# Patient Record
Sex: Female | Born: 1980 | Race: Black or African American | Hispanic: No | Marital: Married | Smoking: Never smoker
Health system: Southern US, Community
[De-identification: ages and names within clinical notes are randomized; demographics above are authoritative.]

## PROBLEM LIST (undated history)

## (undated) DIAGNOSIS — J309 Allergic rhinitis, unspecified: Secondary | ICD-10-CM

## (undated) DIAGNOSIS — Z9889 Other specified postprocedural states: Secondary | ICD-10-CM

## (undated) DIAGNOSIS — E669 Obesity, unspecified: Secondary | ICD-10-CM

## (undated) DIAGNOSIS — R519 Headache, unspecified: Secondary | ICD-10-CM

## (undated) DIAGNOSIS — H4710 Unspecified papilledema: Secondary | ICD-10-CM

## (undated) DIAGNOSIS — I10 Essential (primary) hypertension: Secondary | ICD-10-CM

## (undated) DIAGNOSIS — D649 Anemia, unspecified: Secondary | ICD-10-CM

## (undated) HISTORY — DX: Obesity, unspecified: E66.9

## (undated) HISTORY — DX: Unspecified papilledema: H47.10

## (undated) HISTORY — DX: Other specified postprocedural states: Z98.890

## (undated) HISTORY — DX: Allergic rhinitis, unspecified: J30.9

---
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
## 2003-03-28 ENCOUNTER — Emergency Department (HOSPITAL_COMMUNITY): Admission: EM | Admit: 2003-03-28 | Discharge: 2003-03-29 | Payer: Self-pay | Admitting: Emergency Medicine

## 2003-03-29 ENCOUNTER — Encounter: Payer: Self-pay | Admitting: Emergency Medicine

## 2003-09-12 ENCOUNTER — Ambulatory Visit (HOSPITAL_BASED_OUTPATIENT_CLINIC_OR_DEPARTMENT_OTHER): Admission: RE | Admit: 2003-09-12 | Discharge: 2003-09-12 | Payer: Self-pay | Admitting: Specialist

## 2003-09-12 ENCOUNTER — Encounter (INDEPENDENT_AMBULATORY_CARE_PROVIDER_SITE_OTHER): Payer: Self-pay | Admitting: *Deleted

## 2003-09-12 ENCOUNTER — Ambulatory Visit (HOSPITAL_COMMUNITY): Admission: RE | Admit: 2003-09-12 | Discharge: 2003-09-12 | Payer: Self-pay | Admitting: Specialist

## 2003-10-08 HISTORY — PX: BREAST REDUCTION SURGERY: SHX8

## 2003-11-07 ENCOUNTER — Emergency Department (HOSPITAL_COMMUNITY): Admission: AD | Admit: 2003-11-07 | Discharge: 2003-11-07 | Payer: Self-pay | Admitting: Family Medicine

## 2005-08-26 ENCOUNTER — Emergency Department (HOSPITAL_COMMUNITY): Admission: EM | Admit: 2005-08-26 | Discharge: 2005-08-26 | Payer: Self-pay | Admitting: Emergency Medicine

## 2005-10-08 ENCOUNTER — Encounter: Payer: Self-pay | Admitting: Internal Medicine

## 2005-10-08 LAB — CONVERTED CEMR LAB

## 2005-12-24 ENCOUNTER — Emergency Department (HOSPITAL_COMMUNITY): Admission: EM | Admit: 2005-12-24 | Discharge: 2005-12-24 | Payer: Self-pay | Admitting: Family Medicine

## 2007-02-05 ENCOUNTER — Ambulatory Visit: Payer: Self-pay | Admitting: Internal Medicine

## 2007-02-05 LAB — CONVERTED CEMR LAB
ALT: 18 units/L (ref 0–40)
AST: 17 units/L (ref 0–37)
Albumin: 3.6 g/dL (ref 3.5–5.2)
Alkaline Phosphatase: 53 units/L (ref 39–117)
BUN: 9 mg/dL (ref 6–23)
Basophils Absolute: 0 10*3/uL (ref 0.0–0.1)
Basophils Relative: 0.2 % (ref 0.0–1.0)
Bilirubin, Direct: 0.1 mg/dL (ref 0.0–0.3)
CO2: 29 meq/L (ref 19–32)
Calcium: 9.1 mg/dL (ref 8.4–10.5)
Chloride: 106 meq/L (ref 96–112)
Creatinine, Ser: 0.9 mg/dL (ref 0.4–1.2)
Eosinophils Absolute: 0 10*3/uL (ref 0.0–0.6)
Eosinophils Relative: 0.4 % (ref 0.0–5.0)
GFR calc Af Amer: 100 mL/min
GFR calc non Af Amer: 82 mL/min
Glucose, Bld: 95 mg/dL (ref 70–99)
HCT: 37 % (ref 36.0–46.0)
Hemoglobin: 12.8 g/dL (ref 12.0–15.0)
Lymphocytes Relative: 26.1 % (ref 12.0–46.0)
MCHC: 34.6 g/dL (ref 30.0–36.0)
MCV: 95 fL (ref 78.0–100.0)
Monocytes Absolute: 0.6 10*3/uL (ref 0.2–0.7)
Monocytes Relative: 7.9 % (ref 3.0–11.0)
Neutro Abs: 4.7 10*3/uL (ref 1.4–7.7)
Neutrophils Relative %: 65.4 % (ref 43.0–77.0)
Platelets: 314 10*3/uL (ref 150–400)
Potassium: 4.1 meq/L (ref 3.5–5.1)
RBC: 3.89 M/uL (ref 3.87–5.11)
RDW: 12.4 % (ref 11.5–14.6)
Sodium: 139 meq/L (ref 135–145)
TSH: 0.58 microintl units/mL (ref 0.35–5.50)
Total Bilirubin: 0.6 mg/dL (ref 0.3–1.2)
Total Protein: 7 g/dL (ref 6.0–8.3)
WBC: 7.2 10*3/uL (ref 4.5–10.5)

## 2007-02-23 ENCOUNTER — Encounter: Admission: RE | Admit: 2007-02-23 | Discharge: 2007-02-23 | Payer: Self-pay | Admitting: Internal Medicine

## 2007-05-16 ENCOUNTER — Encounter: Payer: Self-pay | Admitting: Internal Medicine

## 2007-05-16 DIAGNOSIS — E669 Obesity, unspecified: Secondary | ICD-10-CM | POA: Insufficient documentation

## 2007-05-16 HISTORY — DX: Obesity, unspecified: E66.9

## 2008-03-31 ENCOUNTER — Emergency Department (HOSPITAL_COMMUNITY): Admission: EM | Admit: 2008-03-31 | Discharge: 2008-03-31 | Payer: Self-pay | Admitting: Emergency Medicine

## 2009-04-11 ENCOUNTER — Inpatient Hospital Stay (HOSPITAL_COMMUNITY): Admission: AD | Admit: 2009-04-11 | Discharge: 2009-04-12 | Payer: Self-pay | Admitting: Obstetrics & Gynecology

## 2009-04-14 ENCOUNTER — Inpatient Hospital Stay (HOSPITAL_COMMUNITY): Admission: AD | Admit: 2009-04-14 | Discharge: 2009-04-14 | Payer: Self-pay | Admitting: Obstetrics & Gynecology

## 2009-07-11 ENCOUNTER — Ambulatory Visit: Payer: Self-pay | Admitting: Internal Medicine

## 2009-07-11 DIAGNOSIS — J069 Acute upper respiratory infection, unspecified: Secondary | ICD-10-CM | POA: Insufficient documentation

## 2009-07-11 DIAGNOSIS — J309 Allergic rhinitis, unspecified: Secondary | ICD-10-CM

## 2009-07-11 HISTORY — DX: Allergic rhinitis, unspecified: J30.9

## 2011-01-13 LAB — COMPREHENSIVE METABOLIC PANEL
ALT: 30 U/L (ref 0–35)
AST: 20 U/L (ref 0–37)
Albumin: 2.8 g/dL — ABNORMAL LOW (ref 3.5–5.2)
Alkaline Phosphatase: 59 U/L (ref 39–117)
BUN: 2 mg/dL — ABNORMAL LOW (ref 6–23)
CO2: 21 mEq/L (ref 19–32)
Calcium: 8.5 mg/dL (ref 8.4–10.5)
Chloride: 102 mEq/L (ref 96–112)
Creatinine, Ser: 0.68 mg/dL (ref 0.4–1.2)
GFR calc Af Amer: >60 mL/min (ref >60)
GFR calc non Af Amer: >60 mL/min (ref >60)
Glucose, Bld: 83 mg/dL (ref 70–99)
Potassium: 3.3 mEq/L — ABNORMAL LOW (ref 3.5–5.1)
Sodium: 133 mEq/L — ABNORMAL LOW (ref 135–145)
Total Bilirubin: 0.4 mg/dL (ref 0.3–1.2)
Total Protein: 6.7 g/dL (ref 6.0–8.3)

## 2011-01-13 LAB — CBC
HCT: 27.6 % — ABNORMAL LOW (ref 36.0–46.0)
Hemoglobin: 9.7 g/dL — ABNORMAL LOW (ref 12.0–15.0)
MCHC: 35.1 g/dL (ref 30.0–36.0)
MCV: 95.2 fL (ref 78.0–100.0)
Platelets: 227 10*3/uL (ref 150–400)
RBC: 2.9 MIL/uL — ABNORMAL LOW (ref 3.87–5.11)
RDW: 13.3 % (ref 11.5–15.5)
WBC: 13.1 10*3/uL — ABNORMAL HIGH (ref 4.0–10.5)

## 2011-01-13 LAB — DIFFERENTIAL
Basophils Absolute: 0.1 10*3/uL (ref 0.0–0.1)
Basophils Relative: 1 % (ref 0–1)
Eosinophils Absolute: 0.1 10*3/uL (ref 0.0–0.7)
Eosinophils Relative: 1 % (ref 0–5)
Lymphocytes Relative: 11 % — ABNORMAL LOW (ref 12–46)
Lymphs Abs: 1.4 10*3/uL (ref 0.7–4.0)
Monocytes Absolute: 0.8 10*3/uL (ref 0.1–1.0)
Monocytes Relative: 6 % (ref 3–12)
Neutro Abs: 10.7 10*3/uL — ABNORMAL HIGH (ref 1.7–7.7)
Neutrophils Relative %: 82 % — ABNORMAL HIGH (ref 43–77)

## 2011-01-13 LAB — WET PREP, GENITAL
Clue Cells Wet Prep HPF POC: NONE SEEN
Trich, Wet Prep: NONE SEEN
Yeast Wet Prep HPF POC: NONE SEEN

## 2011-01-13 LAB — URINALYSIS, ROUTINE W REFLEX MICROSCOPIC
Bilirubin Urine: NEGATIVE
Glucose, UA: 100 mg/dL — AB
Ketones, ur: 40 mg/dL — AB
Nitrite: NEGATIVE
Protein, ur: 30 mg/dL — AB
Specific Gravity, Urine: 1.02 (ref 1.005–1.030)
Urobilinogen, UA: 4 mg/dL — ABNORMAL HIGH (ref 0.0–1.0)
pH: 7 (ref 5.0–8.0)

## 2011-01-13 LAB — URINE MICROSCOPIC-ADD ON

## 2011-01-13 LAB — HCG, QUANTITATIVE, PREGNANCY
hCG, Beta Chain, Quant, S: 1022 m[IU]/mL — ABNORMAL HIGH (ref <5)
hCG, Beta Chain, Quant, S: 26759 m[IU]/mL — ABNORMAL HIGH (ref <5)

## 2011-01-13 LAB — GC/CHLAMYDIA PROBE AMP, GENITAL
Chlamydia, DNA Probe: NEGATIVE
GC Probe Amp, Genital: NEGATIVE

## 2011-01-13 LAB — URINE CULTURE
Colony Count: NO GROWTH
Culture: NO GROWTH

## 2011-01-13 LAB — POCT PREGNANCY, URINE: Preg Test, Ur: POSITIVE

## 2011-01-13 LAB — ABO/RH: ABO/RH(D): B POS

## 2011-02-22 NOTE — Op Note (Signed)
NAME:  Angel Clayton, Angel Clayton                         ACCOUNT NO.:  [id number]   MEDICAL RECORD NO.:  [id number]                   PATIENT TYPE:  AMB   LOCATION:  DSC                                  FACILITY:  [facility]   PHYSICIAN:  Earvin Hansen L. Shon Hough, M.D.           DATE OF BIRTH:  03/31/1981   DATE OF PROCEDURE:  09/12/2003  DATE OF DISCHARGE:                                 OPERATIVE REPORT   INDICATIONS FOR PROCEDURE:  A 30 year old with severe macromastia, back and  shoulder pain, secondary to large pendulous breast, increased intertrigo,  and has problems posturally as well as neck and back pain.   PROCEDURE:  Bilateral breast reductions using the inferior pedicle  technique.   ANESTHESIA:  General.   SURGEON:  Gerald L. Shon Hough, M.D.   ASSISTANT:  Alethia Berthold, C.S.A.-P.A.O.   DESCRIPTION OF PROCEDURE:  The patient was taken to the operating room and  placed on the operating room table in the supine position, after being drawn  for the inferior pedicle reduction mammoplasty preoperatively sitting up.  She was redrawn to have the nipple/areolar complex be at 21 cm from the  suprasternal notch.  She then underwent general anesthesia and was intubated  orally and a prep was done to the chest and breasts area in a routine  fashion using Betadine soap and solution, and walled off with sterile towels  and drapes, so as to make a sterile field.  An examination was done of the  breasts showing severe macromastia.  The patient also demonstrates an  accessory breast nipple on the left side within the areola.  After sterile  towels and drapes had been placed, the wounds were injected with Xylocaine  0.25% with epinephrine in a 1:400,000 concentration, a total of 150 mL per  side.  The wounds were scored with a #15 blade, and the skin of the inferior  pedicle was degrees-epithelialized with a #20 blade.  The medial and lateral  fatty dermal pedicles were excised down to underlying fascia.   Hemostasis  was maintained with the Bovie on electrocoagulation.  Out laterally more  tissue was removed, including some accessory breast tissue.  The new key  hold area was also debulked, and then the flaps were transposed and stayed  with #3-0 Prolene.  A subcutaneous closure was done with #3-0 Monocryl x2  levels, and then a running subcuticular stitch of #3-0 Monocryl and #5-0  Monocryl throughout the inverted T.  The wounds were drained with a #10  Blake drain which was placed in the depths of the wound and brought out  through the lateral-most portion of the secretion and secured with #3-0  Prolene.  The wounds were cleansed.  Steri-Strips and soft dressing were  applied to all the areas.  She withstood the procedures very well and was taken to the recovery room in  excellent condition.  The estimated blood loss was 150 mL.  Complications:  None.                                               Yaakov Guthrie. Shon Hough, M.D.    Cathie Hoops  D:  09/12/2003  T:  09/12/2003  Job:  [id number]

## 2011-02-22 NOTE — Assessment & Plan Note (Signed)
[facility]                           PRIMARY CARE OFFICE NOTE   NAME:Clayton, Angel MACLELLAN                      MRN:          [id number]  DATE:02/05/2007                            DOB:          [birth year]    The patient is a 30 year old female, a new patient who presents to  establish primary with me.  She complains of headaches several times a  week, they may last from 15 minutes to an hour and occasionally may  relate to her period.  Occasionally they can be accompanied by nausea.  On occasion prior to headache she would see jagged lines in front of the  eyes.   PAST MEDICAL HISTORY:  Unremarkable.   PAST SURGICAL HISTORY:  Breast reduction in 2005.   ALLERGIES:  NONE.   MEDICINES:  Tylenol p.r.n.   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   FAMILY HISTORY:  Negative for migraines.  Her mom is Michiel Cowboy.   REVIEW OF SYSTEMS:  [facility], no  chest pain or shortness of breath, no syncope, gradual weight gain.  The  rest of the 18 point review of systems is negative.   PHYSICAL:  Blood pressure 132/85, pulse 88, temperature 98.1, weight 221  pounds.  She is in no acute distress, overweight.  HEENT:  With moist mucosa.  NECK:  Supple, no bruits.  She has somewhat enlarged thyroid gland  without nodules.  HEART:  S1, S2, no murmur or gallop.  ABDOMEN:  Soft, nontender, no organomegaly or mass.  LOWER EXTREMITY:  Without edema.  She is awake, alert and cooperative.  Denies being depressed.  Cranial  nerves II-XII nonfocal.  Deep tendon reflexes within normal limits.   ASSESSMENT/PLAN:  1. Migraine headaches, given Midrin to take 1 every hour up to maximum      of 5 p.r.n. migraine.  She can try an Imitrex 100 mg daily p.r.n.      for headaches, she will take ibuprofen 600 mg and 4 Excedrin p.r.n.  2. Goiter, obtain thyroid ultrasound.  Obtained  lab work including      TSH.  3. Obesity discussed, she will try to do her best.  4. Borderline elevated blood pressure, low salt diet, weight loss,      exercise.  5. General health maintenance, advised start Gardasil injection,      information provided.     Georgina Quint. Plotnikov, MD     AVP/MedQ  DD: 02/11/2007  DT: 02/11/2007  Job #: [id number]

## 2011-06-07 ENCOUNTER — Encounter: Payer: Self-pay | Admitting: Internal Medicine

## 2011-06-07 ENCOUNTER — Ambulatory Visit (INDEPENDENT_AMBULATORY_CARE_PROVIDER_SITE_OTHER): Payer: BC Managed Care – HMO | Admitting: Internal Medicine

## 2011-06-07 VITALS — BP 108/70 | HR 107 | Temp 99.3°F | Ht 66.0 in | Wt 234.2 lb

## 2011-06-07 DIAGNOSIS — L0501 Pilonidal cyst with abscess: Secondary | ICD-10-CM

## 2011-06-07 MED ORDER — TRAMADOL HCL 50 MG PO TABS: 50.0000 mg | ORAL_TABLET | Freq: Four times a day (QID) | ORAL | Status: AC | PRN

## 2011-06-07 MED ORDER — DOXYCYCLINE HYCLATE 100 MG PO TABS: 100.0000 mg | ORAL_TABLET | Freq: Two times a day (BID) | ORAL | Status: AC

## 2011-06-07 NOTE — Progress Notes (Signed)
  Subjective:    Patient ID: Angel Clayton, female    DOB: 1981-02-28, 28 y.o.   MRN: [id number]  HPI   Here with 2-3 days acute onset fever,  pain, pressure, general weakness and malaise, and swelling with mild to mod tenderness to natal cleft area, no prior hx, no chills or drainage, with slight ST, but little to no cough and Pt denies chest pain, increased sob or doe, wheezing, orthopnea, PND, increased LE swelling, palpitations, dizziness or syncope.  Pt denies new neurological symptoms such as new headache, or facial or extremity weakness or numbness   Pt denies polydipsia, polyuria. Past Medical History  Diagnosis Date  . ALLERGIC RHINITIS 07/11/2009  . OBESITY 05/16/2007  . Hx of breast reduction, elective    No past surgical history on file.  reports that she has never smoked. She does not have any smokeless tobacco history on file. Her alcohol and drug histories not on file. family history is not on file. No Known Allergies No current outpatient prescriptions on file prior to visit.    Review of Systems All otherwise neg per pt      Objective:   Physical Exam BP 108/70  Pulse 107  Temp(Src) 99.3 F (37.4 C) (Oral)  Ht 5\' 6"  (1.676 m)  Wt 234 lb 4 oz (106.255 kg)  BMI 37.81 kg/m2  SpO2 96%  LMP 05/31/2011 Physical Exam  VS noted, ovese Constitutional: Pt appears well-developed and well-nourished.  HENT: Head: Normocephalic.  Right Ear: External ear normal.  Left Ear: External ear normal.  Eyes: Conjunctivae and EOM are normal. Pupils are equal, round, and reactive to light.  Neck: Normal range of motion. Neck supple.  Cardiovascular: Normal rate and regular rhythm.   Pulmonary/Chest: Effort normal and breath sounds normal.  Neurological: Pt is alert. No cranial nerve deficit.  Skin: Skin is warm. No erythema. Natal cleft with 1cm area mod tender, red, swollen, ? Fluctuant, nondraining Psychiatric: Pt behavior is normal. Thought content normal.         Assessment  & Plan:

## 2011-06-07 NOTE — Assessment & Plan Note (Signed)
Mild to mod, for antibx course, will need further eval and tx  - likely I&D or even pilonidocystectomy; - refer gen surgury;  I prefer today if possible but pt declines  - will accept sept 4 though

## 2011-06-07 NOTE — Patient Instructions (Signed)
Take all new medications as prescribed Continue all other medications as before You will be contacted regarding the referral for: General Surgury - please see the PCC's for the referral appt today You are given the work note today

## 2011-06-11 ENCOUNTER — Encounter (INDEPENDENT_AMBULATORY_CARE_PROVIDER_SITE_OTHER): Payer: Self-pay | Admitting: Surgery

## 2011-06-11 ENCOUNTER — Encounter (INDEPENDENT_AMBULATORY_CARE_PROVIDER_SITE_OTHER): Payer: BC Managed Care – HMO | Admitting: Surgery

## 2011-06-12 NOTE — Progress Notes (Signed)
This encounter was created in error - please disregard.  This encounter was created in error - please disregard.

## 2011-06-17 ENCOUNTER — Encounter (INDEPENDENT_AMBULATORY_CARE_PROVIDER_SITE_OTHER): Payer: BC Managed Care – HMO | Admitting: Surgery

## 2011-06-18 ENCOUNTER — Ambulatory Visit (INDEPENDENT_AMBULATORY_CARE_PROVIDER_SITE_OTHER): Payer: Self-pay | Admitting: *Deleted

## 2011-06-18 DIAGNOSIS — Z111 Encounter for screening for respiratory tuberculosis: Secondary | ICD-10-CM

## 2011-06-20 LAB — TB SKIN TEST
Induration: 0
TB Skin Test: NEGATIVE mm

## 2012-11-10 ENCOUNTER — Encounter: Payer: Self-pay | Admitting: Certified Nurse Midwife

## 2012-11-11 ENCOUNTER — Encounter: Payer: Self-pay | Admitting: Certified Nurse Midwife

## 2012-12-29 ENCOUNTER — Encounter: Payer: Self-pay | Admitting: Certified Nurse Midwife

## 2013-01-05 ENCOUNTER — Encounter: Payer: Self-pay | Admitting: Certified Nurse Midwife

## 2013-01-05 NOTE — Progress Notes (Deleted)
Noted.  Was letter sent

## 2013-01-06 NOTE — Progress Notes (Signed)
Can not close out of in basket

## 2013-04-05 ENCOUNTER — Emergency Department (HOSPITAL_COMMUNITY)
Admission: EM | Admit: 2013-04-05 | Discharge: 2013-04-05 | Disposition: A | Discharge status: Home / Self Care | Payer: Self-pay | Attending: Emergency Medicine | Admitting: Emergency Medicine

## 2013-04-05 ENCOUNTER — Encounter (HOSPITAL_COMMUNITY): Payer: Self-pay | Admitting: *Deleted

## 2013-04-05 DIAGNOSIS — X58XXXA Exposure to other specified factors, initial encounter: Secondary | ICD-10-CM | POA: Insufficient documentation

## 2013-04-05 DIAGNOSIS — E669 Obesity, unspecified: Secondary | ICD-10-CM | POA: Insufficient documentation

## 2013-04-05 DIAGNOSIS — Z79899 Other long term (current) drug therapy: Secondary | ICD-10-CM | POA: Insufficient documentation

## 2013-04-05 DIAGNOSIS — S058X9A Other injuries of unspecified eye and orbit, initial encounter: Secondary | ICD-10-CM | POA: Insufficient documentation

## 2013-04-05 DIAGNOSIS — Y9389 Activity, other specified: Secondary | ICD-10-CM | POA: Insufficient documentation

## 2013-04-05 DIAGNOSIS — R110 Nausea: Secondary | ICD-10-CM | POA: Insufficient documentation

## 2013-04-05 DIAGNOSIS — R51 Headache: Secondary | ICD-10-CM | POA: Insufficient documentation

## 2013-04-05 DIAGNOSIS — Z8709 Personal history of other diseases of the respiratory system: Secondary | ICD-10-CM | POA: Insufficient documentation

## 2013-04-05 DIAGNOSIS — S0501XA Injury of conjunctiva and corneal abrasion without foreign body, right eye, initial encounter: Secondary | ICD-10-CM

## 2013-04-05 DIAGNOSIS — Y929 Unspecified place or not applicable: Secondary | ICD-10-CM | POA: Insufficient documentation

## 2013-04-05 DIAGNOSIS — Z872 Personal history of diseases of the skin and subcutaneous tissue: Secondary | ICD-10-CM | POA: Insufficient documentation

## 2013-04-05 MED ORDER — FLUORESCEIN SODIUM 1 MG OP STRP
1.0000 | ORAL_STRIP | Freq: Once | OPHTHALMIC | Status: AC
  Administered 2013-04-05: 16:00:00 via OPHTHALMIC
  Filled 2013-04-05: qty 1

## 2013-04-05 MED ORDER — HYDROCODONE-ACETAMINOPHEN 5-325 MG PO TABS: 2.0000 | ORAL_TABLET | Freq: Four times a day (QID) | ORAL | Status: DC | PRN

## 2013-04-05 MED ORDER — POLYMYXIN B-TRIMETHOPRIM 10000-0.1 UNIT/ML-% OP SOLN: 1.0000 [drp] | OPHTHALMIC | Status: DC

## 2013-04-05 MED ORDER — TETRACAINE HCL 0.5 % OP SOLN
1.0000 [drp] | Freq: Once | OPHTHALMIC | Status: AC
  Administered 2013-04-05: 1 [drp] via OPHTHALMIC
  Filled 2013-04-05: qty 2

## 2013-04-05 NOTE — ED Notes (Addendum)
Pt reports right eye pain/ irritation that started this morning. Pt now has a headache 8/10. Pt thinks she might have gotten body wash in her eye

## 2013-04-05 NOTE — Progress Notes (Signed)
[facility] has seen patient and provided her with a list of primary care resources. °

## 2013-04-05 NOTE — ED Provider Notes (Signed)
History  This chart was scribed for non-physician practitioner working with Raeford Razor, MD by Greggory Stallion, ED scribe. This patient was seen in room WTR7/WTR7 and the patient's care was started at 3:24 PM.  CSN: [id number] Arrival date & time 04/05/13  1412   Chief Complaint  Patient presents with  . Eye Pain   The history is provided by the patient. No language interpreter was used.    HPI Comments: Angel Clayton is a 32 y.o. female who presents to the Emergency Department with chief complaint of burning right eye pain that started this morning with associated HA. Pt rates her HA pain 8/10. She states she thinks she might have gotten face wash in her eye. Pt states it feels like there is something in her eye. Pt states she has used an eye wash with no relief. Pt states she has not eaten since 11 and is nauseous. Pt states she does not wear contacts or glasses.   Past Medical History  Diagnosis Date  . ALLERGIC RHINITIS 07/11/2009  . OBESITY 05/16/2007  . Hx of breast reduction, elective   . Pilonidal cyst    Past Surgical History  Procedure Laterality Date  . Breast reduction surgery  2005   Family History  Problem Relation Age of Onset  . Hypertension Mother   . Hypertension Father    History  Substance Use Topics  . Smoking status: Never Smoker   . Smokeless tobacco: Not on file  . Alcohol Use: Yes   OB History   Grav Para Term Preterm Abortions TAB SAB Ect Mult Living   1 0   1 1         Review of Systems  A complete 10 system review of systems was obtained and all systems are negative except as noted in the HPI and PMH.   Allergies  Review of patient's allergies indicates no known allergies.  Home Medications   Current Outpatient Rx  Name  Route  Sig  Dispense  Refill  . ibuprofen (ADVIL,MOTRIN) 800 MG tablet   Oral   Take 800 mg by mouth once.         . Multiple Vitamin (MULTIVITAMIN WITH MINERALS) TABS   Oral   Take 1 tablet by mouth daily.          Marland Kitchen OVER THE COUNTER MEDICATION   Oral   Take 2 capsules by mouth daily. Herbalife Cleanse supplements          BP 122/97  Pulse 88  Temp(Src) 99 F (37.2 C) (Oral)  Resp 16  SpO2 99%  Physical Exam  Nursing note and vitals reviewed. Constitutional: She is oriented to person, place, and time. She appears well-developed and well-nourished. No distress.  HENT:  Head: Normocephalic and atraumatic.  Eyes: Conjunctivae and EOM are normal. Pupils are equal, round, and reactive to light.  Corneal abrasion as diagrammed, clearly evident on wood's exam, no foreign bodies, eyelid everted, eye pH is 7.  Visual acuity 20/20 R, L, and 20/15 B  Neck: Neck supple. No tracheal deviation present.  Cardiovascular: Normal rate.   Pulmonary/Chest: Effort normal. No respiratory distress.  Musculoskeletal: Normal range of motion.  Neurological: She is alert and oriented to person, place, and time.  Skin: Skin is warm and dry.  Psychiatric: She has a normal mood and affect. Her behavior is normal.    ED Course  Procedures (including critical care time)  DIAGNOSTIC STUDIES: Oxygen Saturation is 99% on RA, normal by  my interpretation.    COORDINATION OF CARE: 3:36 PM-Discussed treatment plan with pt at bedside and pt agreed to plan.   Labs Reviewed - No data to display No results found. 1. Corneal abrasion, right, initial encounter     MDM  Patient with corneal abrasion.  Will treat with pain medication, eye drops, and follow up with ophthalmology in two days.  Patient is stable and ready for discharge.       I personally performed the services described in this documentation, which was scribed in my presence. The recorded information has been reviewed and is accurate.    Roxy Horseman, PA-C 04/05/13 1605

## 2013-04-06 NOTE — ED Provider Notes (Signed)
Medical screening examination/treatment/procedure(s) were performed by non-physician practitioner and as supervising physician I was immediately available for consultation/collaboration.  Odester Nilson, MD 04/06/13 0128 

## 2013-08-24 ENCOUNTER — Ambulatory Visit (INDEPENDENT_AMBULATORY_CARE_PROVIDER_SITE_OTHER): Payer: BC Managed Care – PPO | Admitting: Internal Medicine

## 2013-08-24 ENCOUNTER — Other Ambulatory Visit (INDEPENDENT_AMBULATORY_CARE_PROVIDER_SITE_OTHER): Payer: BC Managed Care – PPO

## 2013-08-24 ENCOUNTER — Encounter: Payer: Self-pay | Admitting: Internal Medicine

## 2013-08-24 VITALS — BP 112/80 | HR 80 | Temp 98.3°F | Resp 16 | Wt 244.0 lb

## 2013-08-24 DIAGNOSIS — G4700 Insomnia, unspecified: Secondary | ICD-10-CM

## 2013-08-24 DIAGNOSIS — E669 Obesity, unspecified: Secondary | ICD-10-CM

## 2013-08-24 DIAGNOSIS — F411 Generalized anxiety disorder: Secondary | ICD-10-CM

## 2013-08-24 LAB — URINALYSIS
Bilirubin Urine: NEGATIVE
Ketones, ur: NEGATIVE
Nitrite: NEGATIVE
Total Protein, Urine: NEGATIVE
Urine Glucose: NEGATIVE
pH: 6 (ref 5.0–8.0)

## 2013-08-24 LAB — HEPATIC FUNCTION PANEL
ALT: 19 U/L (ref 0–35)
AST: 21 U/L (ref 0–37)
Albumin: 3.9 g/dL (ref 3.5–5.2)
Alkaline Phosphatase: 55 U/L (ref 39–117)
Bilirubin, Direct: 0 mg/dL (ref 0.0–0.3)
Total Protein: 7.8 g/dL (ref 6.0–8.3)

## 2013-08-24 LAB — CBC WITH DIFFERENTIAL/PLATELET
Basophils Absolute: 0.1 10*3/uL (ref 0.0–0.1)
Basophils Relative: 1.6 % (ref 0.0–3.0)
HCT: 35 % — ABNORMAL LOW (ref 36.0–46.0)
Hemoglobin: 11.9 g/dL — ABNORMAL LOW (ref 12.0–15.0)
Lymphocytes Relative: 28.6 % (ref 12.0–46.0)
Lymphs Abs: 2.3 10*3/uL (ref 0.7–4.0)
Monocytes Relative: 9.5 % (ref 3.0–12.0)
Neutro Abs: 4.7 10*3/uL (ref 1.4–7.7)
RBC: 3.76 Mil/uL — ABNORMAL LOW (ref 3.87–5.11)
RDW: 13.7 % (ref 11.5–14.6)

## 2013-08-24 LAB — BASIC METABOLIC PANEL
BUN: 11 mg/dL (ref 6–23)
CO2: 23 mEq/L (ref 19–32)
Calcium: 9 mg/dL (ref 8.4–10.5)
Chloride: 105 mEq/L (ref 96–112)
Creatinine, Ser: 0.7 mg/dL (ref 0.4–1.2)
Glucose, Bld: 78 mg/dL (ref 70–99)

## 2013-08-24 MED ORDER — VITAMIN D 1000 UNITS PO TABS: 1000.0000 [IU] | ORAL_TABLET | Freq: Every day | ORAL | Status: DC

## 2013-08-24 MED ORDER — CLONAZEPAM 0.25 MG PO TBDP: 0.2500 mg | ORAL_TABLET | Freq: Every evening | ORAL | Status: DC | PRN

## 2013-08-24 MED ORDER — NORTRIPTYLINE HCL 10 MG PO CAPS: 10.0000 mg | ORAL_CAPSULE | Freq: Every day | ORAL | Status: DC

## 2013-08-24 NOTE — Progress Notes (Signed)
Pre visit review using our clinic review tool, if applicable. No additional management support is needed unless otherwise documented below in the visit note. 

## 2013-08-24 NOTE — Progress Notes (Signed)
  Subjective:    HPI  Not seen in >3years - new pt C/o insomnia x 1-2 years. Using OTC meds. C/o restless sleep   BP Readings from Last 3 Encounters:  08/24/13 112/80  04/05/13 122/97  06/11/11 122/96   Wt Readings from Last 3 Encounters:  08/24/13 244 lb (110.678 kg)  06/11/11 234 lb (106.142 kg)  06/07/11 234 lb 4 oz (106.255 kg)      Review of Systems  Constitutional: Negative.  Negative for fever, chills, diaphoresis, activity change, appetite change, fatigue and unexpected weight change.  HENT: Negative for congestion, ear pain, facial swelling, hearing loss, mouth sores, nosebleeds, postnasal drip, rhinorrhea, sinus pressure, sneezing, sore throat, tinnitus and trouble swallowing.   Eyes: Negative for pain, discharge, redness, itching and visual disturbance.  Respiratory: Negative for cough, chest tightness, shortness of breath, wheezing and stridor.   Cardiovascular: Negative for chest pain, palpitations and leg swelling.  Gastrointestinal: Negative for nausea, abdominal pain, diarrhea, constipation, blood in stool, abdominal distention, anal bleeding and rectal pain.  Genitourinary: Negative for dysuria, urgency, frequency, hematuria, flank pain, vaginal bleeding, vaginal discharge, difficulty urinating, genital sores, vaginal pain and pelvic pain.  Musculoskeletal: Negative for arthralgias, back pain, gait problem, joint swelling, neck pain and neck stiffness.  Skin: Negative.  Negative for pallor and rash.  Neurological: Negative for dizziness, tremors, seizures, syncope, speech difficulty, weakness, numbness and headaches.  Hematological: Negative for adenopathy. Does not bruise/bleed easily.  Psychiatric/Behavioral: Positive for sleep disturbance. Negative for suicidal ideas, behavioral problems, confusion, dysphoric mood and decreased concentration. The patient is nervous/anxious.        Objective:   Physical Exam  Constitutional: She appears well-developed. No  distress.  HENT:  Head: Normocephalic.  Right Ear: External ear normal.  Left Ear: External ear normal.  Nose: Nose normal.  Mouth/Throat: Oropharynx is clear and moist.  Eyes: Conjunctivae are normal. Pupils are equal, round, and reactive to light. Right eye exhibits no discharge. Left eye exhibits no discharge.  Neck: Normal range of motion. Neck supple. No JVD present. No tracheal deviation present. No thyromegaly present.  Cardiovascular: Normal rate, regular rhythm and normal heart sounds.   Pulmonary/Chest: No stridor. No respiratory distress. She has no wheezes.  Abdominal: Soft. Bowel sounds are normal. She exhibits no distension and no mass. There is no tenderness. There is no rebound and no guarding.  Musculoskeletal: She exhibits no edema and no tenderness.  Lymphadenopathy:    She has no cervical adenopathy.  Neurological: She displays normal reflexes. No cranial nerve deficit. She exhibits normal muscle tone. Coordination normal.  Skin: No rash noted. No erythema.  Psychiatric: She has a normal mood and affect. Her behavior is normal. Judgment and thought content normal.   Lab Results  Component Value Date   WBC 7.9 08/24/2013   HGB 11.9* 08/24/2013   HCT 35.0* 08/24/2013   PLT 292.0 08/24/2013   GLUCOSE 78 08/24/2013   ALT 19 08/24/2013   AST 21 08/24/2013   NA 135 08/24/2013   K 4.0 08/24/2013   CL 105 08/24/2013   CREATININE 0.7 08/24/2013   BUN 11 08/24/2013   CO2 23 08/24/2013   TSH 0.84 08/24/2013          Assessment & Plan:

## 2013-08-28 NOTE — Assessment & Plan Note (Signed)
Start Pamelor Clonazepam prn - rare Labs

## 2013-08-28 NOTE — Assessment & Plan Note (Signed)
Labs

## 2013-10-19 ENCOUNTER — Encounter: Payer: Self-pay | Admitting: Internal Medicine

## 2013-10-19 ENCOUNTER — Ambulatory Visit (INDEPENDENT_AMBULATORY_CARE_PROVIDER_SITE_OTHER): Payer: BC Managed Care – PPO | Admitting: Internal Medicine

## 2013-10-19 VITALS — BP 110/72 | HR 72 | Temp 98.3°F | Resp 16 | Wt 240.0 lb

## 2013-10-19 DIAGNOSIS — G4700 Insomnia, unspecified: Secondary | ICD-10-CM

## 2013-10-19 DIAGNOSIS — E669 Obesity, unspecified: Secondary | ICD-10-CM

## 2013-10-19 MED ORDER — CLONAZEPAM 0.25 MG PO TBDP: 0.2500 mg | ORAL_TABLET | Freq: Every evening | ORAL | Status: DC | PRN

## 2013-10-19 MED ORDER — NORTRIPTYLINE HCL 10 MG PO CAPS: 10.0000 mg | ORAL_CAPSULE | Freq: Every day | ORAL | Status: DC

## 2013-10-19 NOTE — Assessment & Plan Note (Signed)
Better Continue with current prescription therapy as reflected on the Med list: use Nortr 10-20 mg qhs and Clonazepam prn only  Potential benefits of a long term prn benzodiazepines  use as well as potential risks  and complications were explained to the patient and were aknowledged.

## 2013-10-19 NOTE — Progress Notes (Signed)
Pre visit review using our clinic review tool, if applicable. No additional management support is needed unless otherwise documented below in the visit note. 

## 2013-10-19 NOTE — Assessment & Plan Note (Signed)
Better on diet 

## 2013-10-19 NOTE — Progress Notes (Signed)
Patient ID: Angel Clayton, female   DOB: January 11, 1981, 33 y.o.   MRN: [id number]  Subjective:    HPI  Not seen in >3years - new pt F/u insomnia x 1-2 years. Using OTC meds. F/u restless sleep - better  BP Readings from Last 3 Encounters:  10/19/13 110/72  08/24/13 112/80  04/05/13 122/97   Wt Readings from Last 3 Encounters:  10/19/13 240 lb (108.863 kg)  08/24/13 244 lb (110.678 kg)  06/11/11 234 lb (106.142 kg)      Review of Systems  Constitutional: Negative.  Negative for fever, chills, diaphoresis, activity change, appetite change, fatigue and unexpected weight change.  HENT: Negative for congestion, ear pain, facial swelling, hearing loss, mouth sores, nosebleeds, postnasal drip, rhinorrhea, sinus pressure, sneezing, sore throat, tinnitus and trouble swallowing.   Eyes: Negative for pain, discharge, redness, itching and visual disturbance.  Respiratory: Negative for cough, chest tightness, shortness of breath, wheezing and stridor.   Cardiovascular: Negative for chest pain, palpitations and leg swelling.  Gastrointestinal: Negative for nausea, abdominal pain, diarrhea, constipation, blood in stool, abdominal distention, anal bleeding and rectal pain.  Genitourinary: Negative for dysuria, urgency, frequency, hematuria, flank pain, vaginal bleeding, vaginal discharge, difficulty urinating, genital sores, vaginal pain and pelvic pain.  Musculoskeletal: Negative for arthralgias, back pain, gait problem, joint swelling, neck pain and neck stiffness.  Skin: Negative.  Negative for pallor and rash.  Neurological: Negative for dizziness, tremors, seizures, syncope, speech difficulty, weakness, numbness and headaches.  Hematological: Negative for adenopathy. Does not bruise/bleed easily.  Psychiatric/Behavioral: Positive for sleep disturbance. Negative for suicidal ideas, behavioral problems, confusion, dysphoric mood and decreased concentration. The patient is nervous/anxious.       Objective:   Physical Exam  Constitutional: She appears well-developed. No distress.  HENT:  Head: Normocephalic.  Right Ear: External ear normal.  Left Ear: External ear normal.  Nose: Nose normal.  Mouth/Throat: Oropharynx is clear and moist.  Eyes: Conjunctivae are normal. Pupils are equal, round, and reactive to light. Right eye exhibits no discharge. Left eye exhibits no discharge.  Neck: Normal range of motion. Neck supple. No JVD present. No tracheal deviation present. No thyromegaly present.  Cardiovascular: Normal rate, regular rhythm and normal heart sounds.   Pulmonary/Chest: No stridor. No respiratory distress. She has no wheezes.  Abdominal: Soft. Bowel sounds are normal. She exhibits no distension and no mass. There is no tenderness. There is no rebound and no guarding.  Musculoskeletal: She exhibits no edema and no tenderness.  Lymphadenopathy:    She has no cervical adenopathy.  Neurological: She displays normal reflexes. No cranial nerve deficit. She exhibits normal muscle tone. Coordination normal.  Skin: No rash noted. No erythema.  Psychiatric: She has a normal mood and affect. Her behavior is normal. Judgment and thought content normal.   Lab Results  Component Value Date   WBC 7.9 08/24/2013   HGB 11.9* 08/24/2013   HCT 35.0* 08/24/2013   PLT 292.0 08/24/2013   GLUCOSE 78 08/24/2013   ALT 19 08/24/2013   AST 21 08/24/2013   NA 135 08/24/2013   K 4.0 08/24/2013   CL 105 08/24/2013   CREATININE 0.7 08/24/2013   BUN 11 08/24/2013   CO2 23 08/24/2013   TSH 0.84 08/24/2013          Assessment & Plan:

## 2013-11-09 ENCOUNTER — Other Ambulatory Visit: Payer: Self-pay | Admitting: Internal Medicine

## 2013-11-09 NOTE — Telephone Encounter (Signed)
Pt called because her pharmacy did not have the RX for Pamelor.  Explained it would be in her paperwork from Jan. 13.  She states is must not be there and wants another RX.

## 2013-11-09 NOTE — Telephone Encounter (Signed)
Please below,  Ok to refill? Thanks!

## 2013-11-10 NOTE — Telephone Encounter (Signed)
I do not understand the message pls clarify If we need to email Pamelor to the drug store - pls do. Thx

## 2013-11-11 MED ORDER — NORTRIPTYLINE HCL 10 MG PO CAPS: 10.0000 mg | ORAL_CAPSULE | Freq: Every day | ORAL | Status: DC

## 2013-11-11 NOTE — Telephone Encounter (Signed)
Done

## 2014-01-21 ENCOUNTER — Emergency Department (HOSPITAL_COMMUNITY)
Admission: EM | Admit: 2014-01-21 | Discharge: 2014-01-21 | Disposition: A | Discharge status: Home / Self Care | Payer: BC Managed Care – PPO | Attending: Emergency Medicine | Admitting: Emergency Medicine

## 2014-01-21 ENCOUNTER — Encounter (HOSPITAL_COMMUNITY): Payer: Self-pay | Admitting: Emergency Medicine

## 2014-01-21 DIAGNOSIS — Z79899 Other long term (current) drug therapy: Secondary | ICD-10-CM | POA: Insufficient documentation

## 2014-01-21 DIAGNOSIS — H579 Unspecified disorder of eye and adnexa: Secondary | ICD-10-CM | POA: Insufficient documentation

## 2014-01-21 DIAGNOSIS — Z9889 Other specified postprocedural states: Secondary | ICD-10-CM | POA: Insufficient documentation

## 2014-01-21 DIAGNOSIS — Z872 Personal history of diseases of the skin and subcutaneous tissue: Secondary | ICD-10-CM | POA: Insufficient documentation

## 2014-01-21 DIAGNOSIS — J209 Acute bronchitis, unspecified: Secondary | ICD-10-CM | POA: Insufficient documentation

## 2014-01-21 DIAGNOSIS — E669 Obesity, unspecified: Secondary | ICD-10-CM | POA: Insufficient documentation

## 2014-01-21 MED ORDER — HYDROCOD POLST-CHLORPHEN POLST 10-8 MG/5ML PO LQCR: 5.0000 mL | Freq: Two times a day (BID) | ORAL | Status: DC | PRN

## 2014-01-21 MED ORDER — ALBUTEROL SULFATE HFA 108 (90 BASE) MCG/ACT IN AERS
2.0000 | INHALATION_SPRAY | RESPIRATORY_TRACT | Status: DC | PRN
  Administered 2014-01-21: 2 via RESPIRATORY_TRACT
  Filled 2014-01-21: qty 6.7

## 2014-01-21 NOTE — ED Notes (Signed)
Pt states that she has had a cough for 2 weeks that keeps her up at night. Pt states that she coughs up green mucus with tinges of blood. Patient reports taking claritin, cough drops, and dm cough syrup. Pt states that coughing makes her back hurt. Pt alert and oriented in triage.

## 2014-01-21 NOTE — Discharge Instructions (Signed)
Acute Bronchitis Bronchitis is inflammation of the airways that extend from the windpipe into the lungs (bronchi). The inflammation often causes mucus to develop. This leads to a cough, which is the most common symptom of bronchitis.  In acute bronchitis, the condition usually develops suddenly and goes away over time, usually in a couple weeks. Smoking, allergies, and asthma can make bronchitis worse. Repeated episodes of bronchitis may cause further lung problems.  CAUSES Acute bronchitis is most often caused by the same virus that causes a cold. The virus can spread from person to person (contagious).  SIGNS AND SYMPTOMS   Cough.   Fever.   Coughing up mucus.   Body aches.   Chest congestion.   Chills.   Shortness of breath.   Sore throat.  DIAGNOSIS  Acute bronchitis is usually diagnosed through a physical exam. Tests, such as chest X-rays, are sometimes done to rule out other conditions.  TREATMENT  Acute bronchitis usually goes away in a couple weeks. Often times, no medical treatment is necessary. Medicines are sometimes given for relief of fever or cough. Antibiotics are usually not needed but may be prescribed in certain situations. In some cases, an inhaler may be recommended to help reduce shortness of breath and control the cough. A cool mist vaporizer may also be used to help thin bronchial secretions and make it easier to clear the chest.  HOME CARE INSTRUCTIONS  Get plenty of rest.   Drink enough fluids to keep your urine clear or pale yellow (unless you have a medical condition that requires fluid restriction). Increasing fluids may help thin your secretions and will prevent dehydration.   Only take over-the-counter or prescription medicines as directed by your health care provider.   Avoid smoking and secondhand smoke. Exposure to cigarette smoke or irritating chemicals will make bronchitis worse. If you are a smoker, consider using nicotine gum or skin  patches to help control withdrawal symptoms. Quitting smoking will help your lungs heal faster.   Reduce the chances of another bout of acute bronchitis by washing your hands frequently, avoiding people with cold symptoms, and trying not to touch your hands to your mouth, nose, or eyes.   Follow up with your health care provider as directed.  SEEK MEDICAL CARE IF: Your symptoms do not improve after 1 week of treatment.  SEEK IMMEDIATE MEDICAL CARE IF:  You develop an increased fever or chills.   You have chest pain.   You have severe shortness of breath.  You have bloody sputum.   You develop dehydration.  You develop fainting.  You develop repeated vomiting.  You develop a severe headache. MAKE SURE YOU:   Understand these instructions.  Will watch your condition.  Will get help right away if you are not doing well or get worse. Document Released: 10/31/2004 Document Revised: 05/26/2013 Document Reviewed: 03/16/2013 [employer] Patient Information 2014 [employer].  

## 2014-01-21 NOTE — ED Provider Notes (Signed)
CSN: [id number]     Arrival date & time 01/21/14  0017 History   First MD Initiated Contact with Patient 01/21/14 0050     Chief Complaint  Patient presents with  . Cough     (Consider location/radiation/quality/duration/timing/severity/associated sxs/prior Treatment) HPI This is a 33 year old female with a history of allergic rhinitis. She is here with a two-week history of cough. About a week ago the cough was associated with malaise, nasal congestion, sore throat and eye irritation. She has been taking Claritin and dextromethorphan for her symptoms. The other symptoms have improved but the cough has worsened over the past 2 days. It is now "really bad" and keeps her from sleeping at night. She has not had a fever or chills. The cough is occasionally productive of sputum.  Past Medical History  Diagnosis Date  . ALLERGIC RHINITIS 07/11/2009  . OBESITY 05/16/2007  . Hx of breast reduction, elective   . Pilonidal cyst    Past Surgical History  Procedure Laterality Date  . Breast reduction surgery  2005   Family History  Problem Relation Age of Onset  . Hypertension Mother   . Hypertension Father    History  Substance Use Topics  . Smoking status: Never Smoker   . Smokeless tobacco: Not on file  . Alcohol Use: Yes   OB History   Grav Para Term Preterm Abortions TAB SAB Ect Mult Living   1 0   1 1         Review of Systems  All other systems reviewed and are negative.   Allergies  Review of patient's allergies indicates no known allergies.  Home Medications   Prior to Admission medications   Medication Sig Start Date End Date Taking? Authorizing Provider  acetaminophen (TYLENOL) 500 MG tablet Take 2,000 mg by mouth every 6 (six) hours as needed for mild pain.   Yes Historical Provider, MD  brompheniramine-pseudoephedrine-DM 30-2-10 MG/5ML syrup Take 10 mLs by mouth 4 (four) times daily as needed.   Yes Historical Provider, MD  clonazePAM (KLONOPIN) 0.25 MG  disintegrating tablet Take 1-2 tablets (0.25-0.5 mg total) by mouth at bedtime as needed (insomnia). 10/19/13  Yes Georgina QuintAleksei V Plotnikov, MD  loratadine (CLARITIN) 10 MG tablet Take 10 mg by mouth daily as needed for allergies.   Yes Historical Provider, MD  nortriptyline (PAMELOR) 10 MG capsule Take 1-2 capsules (10-20 mg total) by mouth at bedtime. Take with or after dinner or at bedtime 11/10/13  Yes Georgina QuintAleksei V Plotnikov, MD   BP 160/97  Pulse 92  Temp(Src) 98.2 F (36.8 C) (Oral)  Resp 18  SpO2 99%  LMP 01/05/2014  Physical Exam General: Well-developed, well-nourished female in no acute distress; appearance consistent with age of record HENT: normocephalic; atraumatic; pharynx normal Eyes: pupils equal, round and reactive to light; extraocular muscles intact Neck: supple Heart: regular rate and rhythm Lungs: Mildly decreased air movement bilaterally; no wheezing Abdomen: soft; nondistended; nontender Extremities: No deformity; full range of motion Neurologic: Awake, alert and oriented; motor function intact in all extremities and symmetric; no facial droop Skin: Warm and dry Psychiatric: Normal mood and affect    ED Course  Procedures (including critical care time)    MDM      Hanley SeamenJohn L Roylene Heaton, MD 01/21/14 0100

## 2014-01-31 ENCOUNTER — Telehealth: Payer: Self-pay

## 2014-01-31 NOTE — Telephone Encounter (Signed)
The patient called and is hoping to get a refill of the meds that were prescribed to her in the er for her cough.  It is the cough medicine and the albuterol inhaler.   Pharmacy - [facility] [address]  [place] [phone]

## 2014-02-01 NOTE — Telephone Encounter (Signed)
Patient is calling back about this matter. Please advise.

## 2014-02-01 NOTE — Telephone Encounter (Signed)
Use OTC meds OV if problems Thx 

## 2014-02-03 ENCOUNTER — Other Ambulatory Visit: Payer: Self-pay | Admitting: Internal Medicine

## 2014-02-11 NOTE — Telephone Encounter (Signed)
OV q 6 mo 

## 2014-08-08 ENCOUNTER — Encounter (HOSPITAL_COMMUNITY): Payer: Self-pay | Admitting: Emergency Medicine

## 2014-10-07 HISTORY — PX: LAPAROSCOPIC GASTRIC SLEEVE RESECTION: SHX5895

## 2015-04-05 ENCOUNTER — Other Ambulatory Visit: Payer: Self-pay | Admitting: Internal Medicine

## 2015-04-06 NOTE — Telephone Encounter (Signed)
Last OV with you was 10/2013. Ok to RF?

## 2015-06-02 ENCOUNTER — Encounter: Payer: Self-pay | Admitting: Internal Medicine

## 2015-06-02 ENCOUNTER — Ambulatory Visit (INDEPENDENT_AMBULATORY_CARE_PROVIDER_SITE_OTHER): Payer: BC Managed Care – PPO | Admitting: Internal Medicine

## 2015-06-02 DIAGNOSIS — D649 Anemia, unspecified: Secondary | ICD-10-CM | POA: Insufficient documentation

## 2015-06-02 DIAGNOSIS — Z01818 Encounter for other preprocedural examination: Secondary | ICD-10-CM | POA: Diagnosis not present

## 2015-06-02 DIAGNOSIS — E669 Obesity, unspecified: Secondary | ICD-10-CM

## 2015-06-02 DIAGNOSIS — E559 Vitamin D deficiency, unspecified: Secondary | ICD-10-CM | POA: Diagnosis not present

## 2015-06-02 MED ORDER — VITAMIN D3 50 MCG (2000 UT) PO CAPS: 2000.0000 [IU] | ORAL_CAPSULE | Freq: Every day | ORAL | Status: DC

## 2015-06-02 MED ORDER — ERGOCALCIFEROL 1.25 MG (50000 UT) PO CAPS: 50000.0000 [IU] | ORAL_CAPSULE | ORAL | Status: DC

## 2015-06-02 NOTE — Assessment & Plan Note (Signed)
start Vit D prescription 50000 iu weekly (Rx emailed to your pharmacy) followed by over-the-counter Vit D 2000 iu daily.  

## 2015-06-02 NOTE — Progress Notes (Signed)
Subjective:  Patient ID: Angel Clayton, female    DOB: 29-Jul-1981  Age: 34 y.o. MRN: [id number]  CC: Annual Exam   HPI   IM consult  TOWANA STENGLEIN presents for pre-op med clearancefor gastric sleeve Reason: morbid obesity Req by Dr Maxcine [facility]    Outpatient Prescriptions Prior to Visit  Medication Sig Dispense Refill  . acetaminophen (TYLENOL) 500 MG tablet Take 2,000 mg by mouth every 6 (six) hours as needed for mild pain.    . chlorpheniramine-HYDROcodone (TUSSIONEX PENNKINETIC ER) 10-8 MG/5ML LQCR Take 5 mLs by mouth every 12 (twelve) hours as needed for cough. 115 mL 0  . clonazePAM (KLONOPIN) 0.25 MG disintegrating tablet TAKE 1-2 TABLETS AT BEDTIME AS NEEDED FOR INSOMNIA (Patient not taking: Reported on 06/02/2015) 60 tablet 1  . nortriptyline (PAMELOR) 10 MG capsule TAKE 1-2 CAPSULES BY MOUTH EVERY NIGHT AT BEDTIME WITH OR AFTER DINNER OR AT BEDTIME (Patient not taking: Reported on 06/02/2015) 180 capsule 1   No facility-administered medications prior to visit.   Past Medical History  Diagnosis Date  . ALLERGIC RHINITIS 07/11/2009  . OBESITY 05/16/2007  . Hx of breast reduction, elective   . Pilonidal cyst    Past Surgical History  Procedure Laterality Date  . Breast reduction surgery  2005    reports that she has never smoked. She does not have any smokeless tobacco history on file. She reports that she drinks alcohol. Her drug history is not on file. family history includes Hypertension in her father and mother. No Known Allergies  ROS Review of Systems  Constitutional: Positive for unexpected weight change. Negative for chills, activity change, appetite change and fatigue.  HENT: Negative for congestion, mouth sores and sinus pressure.   Eyes: Negative for visual disturbance.  Respiratory: Negative for cough and chest tightness.   Gastrointestinal: Negative for nausea and abdominal pain.  Genitourinary: Negative for frequency, difficulty urinating and  vaginal pain.  Musculoskeletal: Negative for back pain and gait problem.  Skin: Negative for pallor and rash.  Neurological: Negative for dizziness, tremors, seizures, weakness, numbness and headaches.  Psychiatric/Behavioral: Positive for sleep disturbance. Negative for suicidal ideas and confusion. The patient is not nervous/anxious.     Objective:  BP 132/88 mmHg  Pulse 84  Ht  (1.676 m)  Wt 249 lb (112.946 kg)  BMI 40.21 kg/m2  SpO2 97%  BP Readings from Last 3 Encounters:  06/02/15 132/88  01/21/14 160/97  10/19/13 110/72    Wt Readings from Last 3 Encounters:  06/02/15 249 lb (112.946 kg)  10/19/13 240 lb (108.863 kg)  08/24/13 244 lb (110.678 kg)    Physical Exam  Constitutional: She appears well-developed. No distress.  HENT:  Head: Normocephalic.  Right Ear: External ear normal.  Left Ear: External ear normal.  Nose: Nose normal.  Mouth/Throat: Oropharynx is clear and moist.  Eyes: Conjunctivae are normal. Pupils are equal, round, and reactive to light. Right eye exhibits no discharge. Left eye exhibits no discharge.  Neck: Normal range of motion. Neck supple. No JVD present. No tracheal deviation present. No thyromegaly present.  Cardiovascular: Normal rate, regular rhythm and normal heart sounds.   Pulmonary/Chest: No stridor. No respiratory distress. She has no wheezes.  Abdominal: Soft. Bowel sounds are normal. She exhibits no distension and no mass. There is no tenderness. There is no rebound and no guarding.  Musculoskeletal: She exhibits no edema or tenderness.  Lymphadenopathy:    She has no cervical adenopathy.  Neurological: She displays normal reflexes. No cranial nerve deficit. She exhibits normal muscle tone. Coordination normal.  Skin: No rash noted. No erythema.  Psychiatric: She has a normal mood and affect. Her behavior is normal. Judgment and thought content normal.  fullness of prox neck, no goiter Obese  Lab Results  Component Value  Date   WBC 7.9 08/24/2013   HGB 11.9* 08/24/2013   HCT 35.0* 08/24/2013   PLT 292.0 08/24/2013   GLUCOSE 78 08/24/2013   ALT 19 08/24/2013   AST 21 08/24/2013   NA 135 08/24/2013   K 4.0 08/24/2013   CL 105 08/24/2013   CREATININE 0.7 08/24/2013   BUN 11 08/24/2013   CO2 23 08/24/2013   TSH 0.84 08/24/2013    No results found.  Assessment & Plan:   [name] was seen today for annual exam.  Diagnoses and all orders for this visit:  Hypocalcemia -     CBC with Differential/Platelet; Future -     IBC panel; Future -     PTH, Intact and Calcium; Future  Vitamin D deficiency -     CBC with Differential/Platelet; Future -     IBC panel; Future -     PTH, Intact and Calcium; Future  Obesity -     CBC with Differential/Platelet; Future -     IBC panel; Future -     PTH, Intact and Calcium; Future  Preop exam for internal medicine -     EKG 12-Lead -     CBC with Differential/Platelet; Future -     IBC panel; Future -     PTH, Intact and Calcium; Future  Other orders -     Cholecalciferol (VITAMIN D3) 2000 UNITS capsule; Take 1 capsule (2,000 Units total) by mouth daily. -     ergocalciferol (VITAMIN D2) 50000 UNITS capsule; Take 1 capsule (50,000 Units total) by mouth once a week.   I have discontinued Ms. Hunt's chlorpheniramine-HYDROcodone, clonazePAM, and nortriptyline. I am also having her start on Vitamin D3 and ergocalciferol. Additionally, I am having her maintain her acetaminophen and etonogestrel-ethinyl estradiol.  Meds ordered this encounter  Medications  . etonogestrel-ethinyl estradiol (NUVARING) 0.12-0.015 MG/24HR vaginal ring    Sig: USE AS DIRECTED  . Cholecalciferol (VITAMIN D3) 2000 UNITS capsule    Sig: Take 1 capsule (2,000 Units total) by mouth daily.    Dispense:  100 capsule    Refill:  3  . ergocalciferol (VITAMIN D2) 50000 UNITS capsule    Sig: Take 1 capsule (50,000 Units total) by mouth once a week.    Dispense:  6 capsule    Refill:   0     Follow-up: No Follow-up on file.  Sonda Primes, MD

## 2015-06-02 NOTE — Assessment & Plan Note (Signed)
Labs

## 2015-06-02 NOTE — Assessment & Plan Note (Addendum)
EKG Labs Sleep test  Ms Alto Denver is clear for surgery assuming her pre-op labs are acceptable. Thank you!

## 2015-06-02 NOTE — Progress Notes (Signed)
Pre visit review using our clinic review tool, if applicable. No additional management support is needed unless otherwise documented below in the visit note. 

## 2015-06-02 NOTE — Assessment & Plan Note (Signed)
PTH Correct Vit D def Labs

## 2015-06-02 NOTE — Assessment & Plan Note (Addendum)
Sleep test pending Labs  Gastric sleeve test is planned at [facility]urlene Dolphin - winter 2016

## 2015-07-06 ENCOUNTER — Ambulatory Visit (HOSPITAL_BASED_OUTPATIENT_CLINIC_OR_DEPARTMENT_OTHER): Payer: BC Managed Care – PPO | Attending: General Surgery | Admitting: *Deleted

## 2015-07-06 VITALS — Ht 66.0 in | Wt 250.0 lb

## 2015-07-06 DIAGNOSIS — R5383 Other fatigue: Secondary | ICD-10-CM | POA: Diagnosis present

## 2015-07-06 DIAGNOSIS — R0683 Snoring: Secondary | ICD-10-CM

## 2015-07-09 DIAGNOSIS — R0683 Snoring: Secondary | ICD-10-CM | POA: Diagnosis not present

## 2015-07-09 NOTE — Progress Notes (Signed)
   Patient Name: Angel, Clayton Date: 07/06/2015 Gender: Female D.O.B: [birth year] Age (years): 34 Referring Provider: Audley Hose Height (inches): 66 Interpreting Physician: Jetty Duhamel MD, ABSM Weight (lbs): 250 RPSGT: Elaina Pattee BMI: 40 MRN: [id number] Neck Size: 15.25 CLINICAL INFORMATION Sleep Study Type: NPSG Indication for sleep study: Fatigue, Snorin  Epworth Sleepiness Score:9/24  SLEEP STUDY TECHNIQUE As per the AASM Manual for the Scoring of Sleep and Associated Events v2.3 (April 2016) with a hypopnea requiring 4% desaturations.  The channels recorded and monitored were frontal, central and occipital EEG, electrooculogram (EOG), submentalis EMG (chin), nasal and oral airflow, thoracic and abdominal wall motion, anterior tibialis EMG, snore microphone, electrocardiogram, and pulse oximetry.  MEDICATIONS Patient's medications include: charted for review. Medications self-administered by patient during sleep study : Tylenol PM  SLEEP ARCHITECTURE The study was initiated at 9:49:53 PM and ended at 4:38:23 AM.  Sleep onset time was 25.3 minutes and the sleep efficiency was 87.1%. The total sleep time was 355.7 minutes.  Stage REM latency was 101.5 minutes.  The patient spent 8.43% of the night in stage N1 sleep, 67.25% in stage N2 sleep, 0.00% in stage N3 and 24.32% in REM.  Alpha intrusion was absent.  Supine sleep was 43.86%.  Wake after sleep onset: 27.5 minutes  RESPIRATORY PARAMETERS The overall apnea/hypopnea index (AHI) was 0.0 per hour. There were 0 total apneas, including 0 obstructive, 0 central and 0 mixed apneas. There were 0 hypopneas and 14 RERAs.  The AHI during Stage REM sleep was 0.0 per hour.  AHI while supine was 0.0 per hour.  The mean oxygen saturation was 97.71%. The minimum SpO2 during sleep was 92.00%.  Soft snoring was noted during this study.  CARDIAC DATA The 2 lead EKG demonstrated sinus rhythm. The mean  heart rate was 82.84 beats per minute. Other EKG findings include: None. LEG MOVEMENT DATA The total PLMS were 0 with a resulting PLMS index of 0.00. Associated arousal with leg movement index was 0.0 .  IMPRESSIONS No significant obstructive sleep apnea occurred during this study (AHI = 0.0/h). No significant central sleep apnea occurred during this study (CAI = 0.0/h). The patient had minimal or no oxygen desaturation during the study (Min O2 = 92.00%) The patient snored with Soft snoring volume. No cardiac abnormalities were noted during this study. Clinically significant periodic limb movements did not occur during sleep. No significant associated arousals.   DIAGNOSIS Normal study  RECOMMENDATIONS Avoid alcohol, sedatives and other CNS depressants that may worsen sleep apnea and disrupt normal sleep architecture. Sleep hygiene should be reviewed to assess factors that may improve sleep quality. Weight management and regular exercise should be initiated or continued if appropriate.  Waymon Budge Diplomate, American Board of Sleep Medicine  ELECTRONICALLY SIGNED ON:  07/09/2015, 12:42 PM [facility] PH: [phone]   FX: [phone] ACCREDITED BY THE AMERICAN ACADEMY OF SLEEP MEDICINE

## 2015-09-06 HISTORY — PX: UPPER GI ENDOSCOPY: SHX6162

## 2016-03-05 ENCOUNTER — Ambulatory Visit (INDEPENDENT_AMBULATORY_CARE_PROVIDER_SITE_OTHER): Payer: BC Managed Care – PPO | Admitting: Internal Medicine

## 2016-03-05 VITALS — BP 120/74 | HR 80 | Resp 20 | Wt 217.0 lb

## 2016-03-05 DIAGNOSIS — K120 Recurrent oral aphthae: Secondary | ICD-10-CM | POA: Insufficient documentation

## 2016-03-05 DIAGNOSIS — H6990 Unspecified Eustachian tube disorder, unspecified ear: Secondary | ICD-10-CM | POA: Insufficient documentation

## 2016-03-05 DIAGNOSIS — J069 Acute upper respiratory infection, unspecified: Secondary | ICD-10-CM

## 2016-03-05 DIAGNOSIS — H6981 Other specified disorders of Eustachian tube, right ear: Secondary | ICD-10-CM | POA: Diagnosis not present

## 2016-03-05 DIAGNOSIS — H6980 Other specified disorders of Eustachian tube, unspecified ear: Secondary | ICD-10-CM | POA: Insufficient documentation

## 2016-03-05 MED ORDER — AZITHROMYCIN 250 MG PO TABS: ORAL_TABLET | ORAL | Status: DC

## 2016-03-05 MED ORDER — TRIAMCINOLONE ACETONIDE 0.1 % MT PSTE: 1.0000 "application " | PASTE | Freq: Two times a day (BID) | OROMUCOSAL | Status: DC

## 2016-03-05 MED ORDER — VALACYCLOVIR HCL 1 G PO TABS: 1000.0000 mg | ORAL_TABLET | Freq: Three times a day (TID) | ORAL | Status: DC

## 2016-03-05 NOTE — Assessment & Plan Note (Signed)
D/w pt, for mucinex otc prn,  to f/u any worsening symptoms or concerns

## 2016-03-05 NOTE — Progress Notes (Signed)
Pre visit review using our clinic review tool, if applicable. No additional management support is needed unless otherwise documented below in the visit note. 

## 2016-03-05 NOTE — Assessment & Plan Note (Signed)
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

## 2016-03-05 NOTE — Patient Instructions (Signed)
Please take all new medication as prescribed- the antibiotic for the upper respiratory infection, the antibiotic for the tongue sore, and the paste to help with the pain  You can also take Mucinex (or it's generic off brand) for congestion, and tylenol as needed for pain.  Please continue all other medications as before, and refills have been done if requested.  Please have the pharmacy call with any other refills you may need.  Please keep your appointments with your specialists as you may have planned

## 2016-03-05 NOTE — Assessment & Plan Note (Signed)
Mild to mod, for antibx course,  to f/u any worsening symptoms or concerns 

## 2016-03-05 NOTE — Progress Notes (Signed)
   Subjective:    Patient ID: Angel Clayton, female    DOB: [birth year], 35 y.o.   MRN: [id number]  HPI   Here with 2-3 days acute onset fever, facial pain, pressure, headache, general weakness and malaise, and greenish d/c, with mild ST and cough, but pt denies chest pain, wheezing, increased sob or doe, orthopnea, PND, increased LE swelling, palpitations, dizziness or syncope.  Also with bilat right > left ear pain/ pressure/popping and crackling, without hearing loss, Also has a new sore in the last 2 days to right prox lateral tongue sort of like previous cold sores in the past. Past Medical History  Diagnosis Date  . ALLERGIC RHINITIS 07/11/2009  . OBESITY 05/16/2007  . Hx of breast reduction, elective   . Pilonidal cyst    Past Surgical History  Procedure Laterality Date  . Breast reduction surgery  2005    reports that she has never smoked. She does not have any smokeless tobacco history on file. She reports that she drinks alcohol. Her drug history is not on file. family history includes Hypertension in her father and mother. No Known Allergies Current Outpatient Prescriptions on File Prior to Visit  Medication Sig Dispense Refill  . acetaminophen (TYLENOL) 500 MG tablet Take 2,000 mg by mouth every 6 (six) hours as needed for mild pain.    . Cholecalciferol (VITAMIN D3) 2000 UNITS capsule Take 1 capsule (2,000 Units total) by mouth daily. 100 capsule 3  . ergocalciferol (VITAMIN D2) 50000 UNITS capsule Take 1 capsule (50,000 Units total) by mouth once a week. 6 capsule 0  . etonogestrel-ethinyl estradiol (NUVARING) 0.12-0.015 MG/24HR vaginal ring USE AS DIRECTED     No current facility-administered medications on file prior to visit.   Review of Systems  Constitutional: Negative for unusual diaphoresis or night sweats HENT: Negative for ear swelling or discharge Eyes: Negative for worsening visual haziness  Respiratory: Negative for choking and stridor.   Gastrointestinal:  Negative for distension or worsening eructation Genitourinary: Negative for retention or change in urine volume.  Musculoskeletal: Negative for other MSK pain or swelling Skin: Negative for color change and worsening wound Neurological: Negative for tremors and numbness other than noted  Psychiatric/Behavioral: Negative for decreased concentration or agitation other than above       Objective:   Physical Exam BP 120/74 mmHg  Pulse 80  Resp 20  Wt 217 lb (98.431 kg)  SpO2 97%\VS noted,  Constitutional: Pt appears in no apparent distress HENT: Head: NCAT.  Right Ear: External ear normal.  Left Ear: External ear normal.  Right lateral tongue near base with 5 mm ulceration tender with surrounding mild erythema and swelling Bilat tm's with mild erythema.  Max sinus areas mild tender.  Pharynx with mild erythema, no exudate Eyes: . Pupils are equal, round, and reactive to light. Conjunctivae and EOM are normal Neck: Normal range of motion. Neck supple.  Cardiovascular: Normal rate and regular rhythm.   Pulmonary/Chest: Effort normal and breath sounds without rales or wheezing.  Neurological: Pt is alert. Not confused , motor grossly intact Skin: Skin is warm. No rash, no LE edema Psychiatric: Pt behavior is normal. No agitation.     Assessment & Plan:

## 2016-10-30 ENCOUNTER — Ambulatory Visit: Payer: BC Managed Care – PPO | Admitting: Nurse Practitioner

## 2016-11-07 ENCOUNTER — Encounter: Payer: Self-pay | Admitting: Internal Medicine

## 2016-11-07 ENCOUNTER — Ambulatory Visit (INDEPENDENT_AMBULATORY_CARE_PROVIDER_SITE_OTHER): Payer: BC Managed Care – PPO | Admitting: Internal Medicine

## 2016-11-07 DIAGNOSIS — J069 Acute upper respiratory infection, unspecified: Secondary | ICD-10-CM | POA: Diagnosis not present

## 2016-11-07 MED ORDER — AZITHROMYCIN 250 MG PO TABS: ORAL_TABLET | ORAL | 0 refills | Status: DC

## 2016-11-07 MED ORDER — PROMETHAZINE-CODEINE 6.25-10 MG/5ML PO SYRP: 5.0000 mL | ORAL_SOLUTION | ORAL | 0 refills | Status: DC | PRN

## 2016-11-07 NOTE — Progress Notes (Signed)
Pre visit review using our clinic review tool, if applicable. No additional management support is needed unless otherwise documented below in the visit note. 

## 2016-11-07 NOTE — Progress Notes (Signed)
Subjective:  Patient ID: Angel Clayton, female    DOB: 26-Nov-1980  Age: 36 y.o. MRN: [id number]  CC: No chief complaint on file.   HPI Angel SmokerCourtney B Clayton presents for URI x 2 wks. Went to UC, took Tamiflu  Outpatient Medications Prior to Visit  Medication Sig Dispense Refill  . etonogestrel-ethinyl estradiol (NUVARING) 0.12-0.015 MG/24HR vaginal ring USE AS DIRECTED    . acetaminophen (TYLENOL) 500 MG tablet Take 2,000 mg by mouth every 6 (six) hours as needed for mild pain.    . Cholecalciferol (VITAMIN D3) 2000 UNITS capsule Take 1 capsule (2,000 Units total) by mouth daily. (Patient not taking: Reported on 11/07/2016) 100 capsule 3  . ergocalciferol (VITAMIN D2) 50000 UNITS capsule Take 1 capsule (50,000 Units total) by mouth once a week. (Patient not taking: Reported on 11/07/2016) 6 capsule 0  . triamcinolone (KENALOG) 0.1 % paste Use as directed 1 application in the mouth or throat 2 (two) times daily. (Patient not taking: Reported on 11/07/2016) 5 g 12  . valACYclovir (VALTREX) 1000 MG tablet Take 1 tablet (1,000 mg total) by mouth 3 (three) times daily. (Patient not taking: Reported on 11/07/2016) 21 tablet 0  . azithromycin (ZITHROMAX Z-PAK) 250 MG tablet 2 tabs by mouth on day 1, then 1 tab per day 6 tablet 1   No facility-administered medications prior to visit.     ROS Review of Systems  Constitutional: Negative for activity change, appetite change, chills, fatigue, fever and unexpected weight change.  HENT: Positive for congestion, rhinorrhea and voice change. Negative for mouth sores, sinus pressure and sore throat.   Eyes: Negative for visual disturbance.  Respiratory: Positive for cough. Negative for chest tightness and shortness of breath.   Gastrointestinal: Negative for abdominal pain and nausea.  Genitourinary: Negative for difficulty urinating, frequency and vaginal pain.  Musculoskeletal: Positive for arthralgias. Negative for back pain and gait problem.  Skin: Negative  for pallor and rash.  Neurological: Negative for dizziness, tremors, weakness, numbness and headaches.  Psychiatric/Behavioral: Negative for confusion and sleep disturbance.    Objective:  BP 134/72   Pulse 89   Temp 98.8 F (37.1 C) (Oral)   Resp 20   Wt 217 lb (98.4 kg)   SpO2 97%   BMI 35.02 kg/m   BP Readings from Last 3 Encounters:  11/07/16 134/72  03/05/16 120/74  06/02/15 132/88    Wt Readings from Last 3 Encounters:  11/07/16 217 lb (98.4 kg)  03/05/16 217 lb (98.4 kg)  07/06/15 250 lb (113.4 kg)    Physical Exam  Constitutional: She appears well-developed. No distress.  HENT:  Head: Normocephalic.  Right Ear: External ear normal.  Left Ear: External ear normal.  Nose: Nose normal.  Mouth/Throat: Oropharynx is clear and moist.  Eyes: Conjunctivae are normal. Pupils are equal, round, and reactive to light. Right eye exhibits no discharge. Left eye exhibits no discharge.  Neck: Normal range of motion. Neck supple. No JVD present. No tracheal deviation present. No thyromegaly present.  Cardiovascular: Normal rate, regular rhythm and normal heart sounds.   Pulmonary/Chest: No stridor. No respiratory distress. She has no wheezes.  Abdominal: Soft. Bowel sounds are normal. She exhibits no distension and no mass. There is no tenderness. There is no rebound and no guarding.  Musculoskeletal: She exhibits no edema or tenderness.  Lymphadenopathy:    She has no cervical adenopathy.  Neurological: She displays normal reflexes. No cranial nerve deficit. She exhibits normal muscle tone. Coordination normal.  Skin: No rash noted. No erythema.  Psychiatric: She has a normal mood and affect. Her behavior is normal. Judgment and thought content normal.  eryth throat  Lab Results  Component Value Date   WBC 7.9 08/24/2013   HGB 11.9 (L) 08/24/2013   HCT 35.0 (L) 08/24/2013   PLT 292.0 08/24/2013   GLUCOSE 78 08/24/2013   ALT 19 08/24/2013   AST 21 08/24/2013   NA 135  08/24/2013   K 4.0 08/24/2013   CL 105 08/24/2013   CREATININE 0.7 08/24/2013   BUN 11 08/24/2013   CO2 23 08/24/2013   TSH 0.84 08/24/2013    No results found.  Assessment & Plan:   There are no diagnoses linked to this encounter. I have discontinued Ms. Clayton's azithromycin. I am also having her maintain her acetaminophen, etonogestrel-ethinyl estradiol, Vitamin D3, ergocalciferol, valACYclovir, and triamcinolone.  No orders of the defined types were placed in this encounter.    Follow-up: No Follow-up on file.  Sonda Primes, MD

## 2016-11-07 NOTE — Patient Instructions (Signed)
Use over-the-counter  "cold" medicines  such as "Tylenol cold" , "Advil cold",  "Mucinex" or" Mucinex D"  for cough and congestion.   Avoid decongestants if you have high blood pressure and use "Afrin" nasal spray for nasal congestion as directed instead. Use" Delsym" or" Robitussin" cough syrup varietis for cough.  You can use plain "Tylenol" or "Advil" for fever, chills and achyness. Use Halls or Ricola cough drops.  Please, make an appointment if you are not better or if you're worse.  

## 2016-11-07 NOTE — Assessment & Plan Note (Signed)
Bronchitis Zpac Prom-cod syr

## 2017-01-21 LAB — HM PAP SMEAR: HM Pap smear: NORMAL

## 2017-01-22 ENCOUNTER — Encounter: Payer: Self-pay | Admitting: Internal Medicine

## 2017-08-26 ENCOUNTER — Encounter: Payer: Self-pay | Admitting: Internal Medicine

## 2017-08-26 ENCOUNTER — Ambulatory Visit: Payer: BC Managed Care – PPO | Admitting: Internal Medicine

## 2017-08-26 VITALS — BP 122/84 | HR 96 | Temp 99.5°F | Ht 66.0 in | Wt 222.0 lb

## 2017-08-26 DIAGNOSIS — R6889 Other general symptoms and signs: Secondary | ICD-10-CM | POA: Diagnosis not present

## 2017-08-26 DIAGNOSIS — J069 Acute upper respiratory infection, unspecified: Secondary | ICD-10-CM | POA: Diagnosis not present

## 2017-08-26 LAB — POC INFLUENZA A&B (BINAX/QUICKVUE)
INFLUENZA A, POC: NEGATIVE
INFLUENZA B, POC: NEGATIVE

## 2017-08-26 MED ORDER — AZITHROMYCIN 250 MG PO TABS: ORAL_TABLET | ORAL | 1 refills | Status: DC

## 2017-08-26 MED ORDER — PROMETHAZINE-CODEINE 6.25-10 MG/5ML PO SYRP: 5.0000 mL | ORAL_SOLUTION | ORAL | 0 refills | Status: DC | PRN

## 2017-08-26 NOTE — Assessment & Plan Note (Signed)
Mild to mod, for antibx course,  to f/u any worsening symptoms or concerns 

## 2017-08-26 NOTE — Patient Instructions (Addendum)
Please take all new medication as prescribed - the antibiotic, and cough medicine if needed  Please continue all other medications as before, and refills have been done if requested.  Please have the pharmacy call with any other refills you may need.  Please keep your appointments with your specialists as you may have planned     

## 2017-08-26 NOTE — Progress Notes (Signed)
   Subjective:    Patient ID: Angel Clayton, female    DOB: [birth year], 36 y.o.   MRN: [id number]  HPI   Here with 2-3 days acute onset fever, facial pain, pressure, headache, general weakness and malaise, and greenish d/c, with mild ST and scant prod cough, but pt denies chest pain, wheezing, increased sob or doe, orthopnea, PND, increased LE swelling, palpitations, dizziness or syncope.  Had coworker with strep throat exposure yesterday Past Medical History:  Diagnosis Date  . ALLERGIC RHINITIS 07/11/2009  . Hx of breast reduction, elective   . OBESITY 05/16/2007  . Pilonidal cyst    Past Surgical History:  Procedure Laterality Date  . BREAST REDUCTION SURGERY  2005    reports that  has never smoked. she has never used smokeless tobacco. She reports that she drinks alcohol. Her drug history is not on file. family history includes Hypertension in her father and mother. No Known Allergies Current Outpatient Medications on File Prior to Visit  Medication Sig Dispense Refill  . acetaminophen (TYLENOL) 500 MG tablet Take 2,000 mg by mouth every 6 (six) hours as needed for mild pain.    . Cholecalciferol (VITAMIN D3) 2000 UNITS capsule Take 1 capsule (2,000 Units total) by mouth daily. 100 capsule 3  . ergocalciferol (VITAMIN D2) 50000 UNITS capsule Take 1 capsule (50,000 Units total) by mouth once a week. 6 capsule 0  . etonogestrel-ethinyl estradiol (NUVARING) 0.12-0.015 MG/24HR vaginal ring USE AS DIRECTED    . triamcinolone (KENALOG) 0.1 % paste Use as directed 1 application in the mouth or throat 2 (two) times daily. 5 g 12  . valACYclovir (VALTREX) 1000 MG tablet Take 1 tablet (1,000 mg total) by mouth 3 (three) times daily. 21 tablet 0   No current facility-administered medications on file prior to visit.    Review of Systems  All other system neg per pt    Objective:   Physical Exam BP 122/84   Pulse 96   Temp 99.5 F (37.5 C) (Oral)   Ht 5\' 6"  (1.676 m)   Wt 222 lb (100.7  kg)   SpO2 100%   BMI 35.83 kg/m  VS noted, mild ill Constitutional: Pt appears in NAD HENT: Head: NCAT.  Right Ear: External ear normal.  Left Ear: External ear normal.  Eyes: . Pupils are equal, round, and reactive to light. Conjunctivae and EOM are normal Bilat tm's with mild erythema.  Max sinus areas mild tender.  Pharynx with severe erythema, slight + exudate Nose: without d/c or deformity Neck: Neck supple. Gross normal ROM with bilat submandib LA Cardiovascular: Normal rate and regular rhythm.   Pulmonary/Chest: Effort normal and breath sounds without rales or wheezing.  Neurological: Pt is alert. At baseline orientation, motor grossly intact Skin: Skin is warm. No rashes, other new lesions, no LE edema Psychiatric: Pt behavior is normal without agitation  No other exam findings  POCT - influenza negative    Assessment & Plan:

## 2017-11-26 ENCOUNTER — Ambulatory Visit: Payer: BC Managed Care – PPO | Admitting: Family

## 2017-11-26 ENCOUNTER — Encounter: Payer: Self-pay | Admitting: Family

## 2017-11-26 VITALS — BP 126/82 | HR 80 | Temp 98.5°F | Ht 66.0 in | Wt 232.0 lb

## 2017-11-26 DIAGNOSIS — J039 Acute tonsillitis, unspecified: Secondary | ICD-10-CM | POA: Diagnosis not present

## 2017-11-26 MED ORDER — AMOXICILLIN 875 MG PO TABS: 875.0000 mg | ORAL_TABLET | Freq: Two times a day (BID) | ORAL | 0 refills | Status: DC

## 2017-11-26 MED ORDER — PROMETHAZINE-CODEINE 6.25-10 MG/5ML PO SYRP: 5.0000 mL | ORAL_SOLUTION | ORAL | 0 refills | Status: DC | PRN

## 2017-11-26 NOTE — Patient Instructions (Signed)
Change your toothbrush after 24 hours and again after finishing the antibiotics; increase fluids, rest;

## 2017-11-26 NOTE — Progress Notes (Signed)
Angel Clayton is a 37 y.o. female with the following history as recorded in [employer]:  Patient Active Problem List   Diagnosis Date Noted  . Acute upper respiratory infection 03/05/2016  . Eustachian tube dysfunction 03/05/2016  . 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 Hypocalcemia 06/02/2015  . Anemia 06/02/2015  . Vitamin D deficiency 06/02/2015  . Preop exam for internal medicine 06/02/2015  . Insomnia 08/24/2013  . Pilonidal cyst with abscess 06/07/2011  . ALLERGIC RHINITIS 07/11/2009  . Obesity 05/16/2007    Current Outpatient Medications  Medication Sig Dispense Refill  . acetaminophen (TYLENOL) 500 MG tablet Take 2,000 mg by mouth every 6 (six) hours as needed for mild pain.    Marland Kitchen amoxicillin (AMOXIL) 875 MG tablet Take 1 tablet (875 mg total) by mouth 2 (two) times daily. 20 tablet 0  . azithromycin (ZITHROMAX Z-PAK) 250 MG tablet As directed 6 each 1  . Cholecalciferol (VITAMIN D3) 2000 UNITS capsule Take 1 capsule (2,000 Units total) by mouth daily. 100 capsule 3  . ergocalciferol (VITAMIN D2) 50000 UNITS capsule Take 1 capsule (50,000 Units total) by mouth once a week. 6 capsule 0  . etonogestrel-ethinyl estradiol (NUVARING) 0.12-0.015 MG/24HR vaginal ring USE AS DIRECTED    . promethazine-codeine (PHENERGAN WITH CODEINE) 6.25-10 MG/5ML syrup Take 5 mLs by mouth every 4 (four) hours as needed. 120 mL 0  . triamcinolone (KENALOG) 0.1 % paste Use as directed 1 application in the mouth or throat 2 (two) times daily. 5 g 12  . valACYclovir (VALTREX) 1000 MG tablet Take 1 tablet (1,000 mg total) by mouth 3 (three) times daily. 21 tablet 0   No current facility-administered medications for this visit.     Allergies: Patient has no known allergies.  Past Medical History:  Diagnosis Date  . ALLERGIC RHINITIS 07/11/2009  . Hx of breast reduction, elective   . OBESITY 05/16/2007  . Pilonidal cyst     Past Surgical History:  Procedure Laterality Date  . BREAST REDUCTION SURGERY  2005     Family History  Problem Relation Age of Onset  . Hypertension Mother   . Hypertension Father     Social History   Tobacco Use  . Smoking status: Never Smoker  . Smokeless tobacco: Never Used  Substance Use Topics  . Alcohol use: Yes    Subjective:  3 day history of cough/ congestion/ sore throat; no known fever; no coughing/ no sneezing; not prone to allergies; has tried OTC warm salt gargles with no benefit; Prone to recurrent strep throat;   Objective:  Vitals:   11/26/17 1637  BP: 126/82  Pulse: 80  Temp: 98.5 F (36.9 C)  TempSrc: Oral  SpO2: 98%  Weight: 232 lb (105.2 kg)  Height: 5\' 6"  (1.676 m)    General: Well developed, well nourished, in no acute distress  Skin : Warm and dry.  Head: Normocephalic and atraumatic  Eyes: Sclera and conjunctiva clear; pupils round and reactive to light; extraocular movements intact  Ears: External normal; canals clear; tympanic membranes normal  Oropharynx: Pink, supple. No suspicious lesions; left tonsil enlarged with exudate Neck: Supple without thyromegaly, adenopathy  Lungs: Respirations unlabored; clear to auscultation bilaterally without wheeze, rales, rhonchi  CVS exam: normal rate and regular rhythm.  Neurologic: Alert and oriented; speech intact; face symmetrical; moves all extremities well; CNII-XII intact without focal deficit   Assessment:  1. Acute tonsillitis, unspecified etiology     Plan:  Rx for Amoxicilllin 875 mg bid x 10  days; refill on Robitussin AC to help with pain; increase fluids, rest and follow-up worse, no better.   No Follow-up on file.  No orders of the defined types were placed in this encounter.   Requested Prescriptions   Signed Prescriptions Disp Refills  . amoxicillin (AMOXIL) 875 MG tablet 20 tablet 0    Sig: Take 1 tablet (875 mg total) by mouth 2 (two) times daily.  . promethazine-codeine (PHENERGAN WITH CODEINE) 6.25-10 MG/5ML syrup 120 mL 0    Sig: Take 5 mLs by mouth every 4  (four) hours as needed.

## 2017-12-07 ENCOUNTER — Other Ambulatory Visit: Payer: Self-pay | Admitting: Family

## 2017-12-10 ENCOUNTER — Other Ambulatory Visit: Payer: Self-pay | Admitting: Internal Medicine

## 2018-06-29 ENCOUNTER — Ambulatory Visit: Payer: BC Managed Care – PPO | Admitting: Internal Medicine

## 2018-06-29 ENCOUNTER — Ambulatory Visit: Payer: Self-pay | Admitting: Internal Medicine

## 2018-06-29 ENCOUNTER — Encounter: Payer: Self-pay | Admitting: Internal Medicine

## 2018-06-29 VITALS — BP 130/82 | HR 87 | Temp 98.5°F | Ht 66.0 in | Wt 228.0 lb

## 2018-06-29 DIAGNOSIS — G8929 Other chronic pain: Secondary | ICD-10-CM | POA: Diagnosis not present

## 2018-06-29 DIAGNOSIS — G4700 Insomnia, unspecified: Secondary | ICD-10-CM | POA: Diagnosis not present

## 2018-06-29 DIAGNOSIS — M25511 Pain in right shoulder: Secondary | ICD-10-CM | POA: Diagnosis not present

## 2018-06-29 DIAGNOSIS — E559 Vitamin D deficiency, unspecified: Secondary | ICD-10-CM | POA: Diagnosis not present

## 2018-06-29 MED ORDER — MELOXICAM 15 MG PO TABS: 15.0000 mg | ORAL_TABLET | Freq: Every day | ORAL | 3 refills | Status: DC | PRN

## 2018-06-29 MED ORDER — FOLIC ACID 400 MCG PO TABS: 400.0000 ug | ORAL_TABLET | Freq: Every day | ORAL | 3 refills | Status: DC

## 2018-06-29 NOTE — Patient Instructions (Signed)
Tylenol PM at night

## 2018-06-29 NOTE — Assessment & Plan Note (Signed)
H/o multiple dislocations Meloxicam Sports Med ref Tylenol PM at hs prn

## 2018-06-29 NOTE — Assessment & Plan Note (Signed)
Tylenol pm prn 

## 2018-06-29 NOTE — Progress Notes (Signed)
Subjective:  Patient ID: Angel Clayton, female    DOB: [birth year]  Age: 37 y.o. MRN: [id number]  CC: No chief complaint on file.   HPI Angel Clayton presents for R shoulder pain - h/o dislocation 6 times. Can't sleep due to pain. F/u Vit D def  Outpatient Medications Prior to Visit  Medication Sig Dispense Refill  . acetaminophen (TYLENOL) 500 MG tablet Take 2,000 mg by mouth every 6 (six) hours as needed for mild pain.    . Cholecalciferol (VITAMIN D3) 2000 UNITS capsule Take 1 capsule (2,000 Units total) by mouth daily. 100 capsule 3  . ergocalciferol (VITAMIN D2) 50000 UNITS capsule Take 1 capsule (50,000 Units total) by mouth once a week. 6 capsule 0  . etonogestrel-ethinyl estradiol (NUVARING) 0.12-0.015 MG/24HR vaginal ring USE AS DIRECTED    . triamcinolone (KENALOG) 0.1 % paste Use as directed 1 application in the mouth or throat 2 (two) times daily. 5 g 12  . amoxicillin (AMOXIL) 875 MG tablet Take 1 tablet (875 mg total) by mouth 2 (two) times daily. 20 tablet 0  . azithromycin (ZITHROMAX Z-PAK) 250 MG tablet As directed 6 each 1  . Promethazine-Codeine 6.25-10 MG/5ML SOLN TAKE 5 ML BY MOUTH EVERY 4 HOURS AS NEEDED 120 mL 0  . valACYclovir (VALTREX) 1000 MG tablet Take 1 tablet (1,000 mg total) by mouth 3 (three) times daily. 21 tablet 0   No facility-administered medications prior to visit.     ROS: Review of Systems  Constitutional: Negative for activity change, appetite change, chills, fatigue and unexpected weight change.  HENT: Negative for congestion, mouth sores and sinus pressure.   Eyes: Negative for visual disturbance.  Respiratory: Negative for cough and chest tightness.   Gastrointestinal: Negative for abdominal pain and nausea.  Genitourinary: Negative for difficulty urinating, frequency and vaginal pain.  Musculoskeletal: Negative for back pain and gait problem.  Skin: Negative for pallor and rash.  Neurological: Negative for dizziness, tremors,  weakness, numbness and headaches.  Psychiatric/Behavioral: Negative for confusion and sleep disturbance.    Objective:  BP 130/82 (BP Location: Left Arm, Patient Position: Sitting, Cuff Size: Large)   Pulse 87   Temp 98.5 F (36.9 C) (Oral)   Ht 5\' 6"  (1.676 m)   Wt 228 lb (103.4 kg)   SpO2 98%   BMI 36.80 kg/m   BP Readings from Last 3 Encounters:  06/29/18 130/82  11/26/17 126/82  08/26/17 122/84    Wt Readings from Last 3 Encounters:  06/29/18 228 lb (103.4 kg)  11/26/17 232 lb (105.2 kg)  08/26/17 222 lb (100.7 kg)    Physical Exam  Constitutional: She appears well-developed. No distress.  HENT:  Head: Normocephalic.  Right Ear: External ear normal.  Left Ear: External ear normal.  Nose: Nose normal.  Mouth/Throat: Oropharynx is clear and moist.  Eyes: Pupils are equal, round, and reactive to light. Conjunctivae are normal. Right eye exhibits no discharge. Left eye exhibits no discharge.  Neck: Normal range of motion. Neck supple. No JVD present. No tracheal deviation present. No thyromegaly present.  Cardiovascular: Normal rate, regular rhythm and normal heart sounds.  Pulmonary/Chest: No stridor. No respiratory distress. She has no wheezes.  Abdominal: Soft. Bowel sounds are normal. She exhibits no distension and no mass. There is tenderness. There is no rebound and no guarding.  Musculoskeletal: She exhibits no edema or tenderness.  Lymphadenopathy:    She has no cervical adenopathy.  Neurological: She displays normal reflexes. No cranial nerve  deficit. She exhibits normal muscle tone. Coordination normal.  Skin: No rash noted. No erythema.  Psychiatric: She has a normal mood and affect. Her behavior is normal. Judgment and thought content normal.   R shoulder is tender w/ROM  Lab Results  Component Value Date   WBC 7.9 08/24/2013   HGB 11.9 (L) 08/24/2013   HCT 35.0 (L) 08/24/2013   PLT 292.0 08/24/2013   GLUCOSE 78 08/24/2013   ALT 19 08/24/2013   AST  21 08/24/2013   NA 135 08/24/2013   K 4.0 08/24/2013   CL 105 08/24/2013   CREATININE 0.7 08/24/2013   BUN 11 08/24/2013   CO2 23 08/24/2013   TSH 0.84 08/24/2013    No results found.  Assessment & Plan:   There are no diagnoses linked to this encounter.   No orders of the defined types were placed in this encounter.    Follow-up: No follow-ups on file.  Sonda PrimesAlex Esmeralda Blanford, MD

## 2018-06-29 NOTE — Telephone Encounter (Signed)
Called in c/o right shoulder pain with shooting pains to elbow.   Also numbness in her right hand at times.    She dislocated this shoulder many years ago and injured her rotator cuff.    She hasn't had problems until 2 months ago she started experiencing pain again.   No new injuries.   She also feels some shifting in her shoulder.  I have scheduled her with Dr. Posey ReaPlotnikov for today at 3:40.   Reason for Disposition . [1] MODERATE pain (e.g., interferes with normal activities) AND [2] present > 3 days  Answer Assessment - Initial Assessment Questions 1. ONSET: "When did the pain start?"     Several years ago I messed up my rotator cuff.   That pain went away but now it has come back.   I dislocated it when I was younger.   2 months the pain has returned.   No new injury. 2. LOCATION: "Where is the pain located?"     Right shoulder shooting down into your right arm to the elbow. 3. PAIN: "How bad is the pain?" (Scale 1-10; or mild, moderate, severe)   - MILD (1-3): doesn't interfere with normal activities   - MODERATE (4-7): interferes with normal activities (e.g., work or school) or awakens from sleep   - SEVERE (8-10): excruciating pain, unable to do any normal activities, unable to move arm at all due to pain     Moderate lately for last 3 weeks.   It's harder to lift things. 4. WORK OR EXERCISE: "Has there been any recent work or exercise that involved this part of the body?"     No new injuries.   Just the dislocation that happened many years ago. 5. CAUSE: "What do you think is causing the shoulder pain?"     I think it's my rotator cuff.   I feel shifting in my shoulder at times. 6. OTHER SYMPTOMS: "Do you have any other symptoms?" (e.g., neck pain, swelling, rash, fever, numbness, weakness)     Having numbness in right hand. 7. PREGNANCY: "Is there any chance you are pregnant?" "When was your last menstrual period?"     No.   2 weeks ago.  Protocols used: SHOULDER PAIN-A-AH

## 2018-06-29 NOTE — Assessment & Plan Note (Signed)
Re-start Vit D Risks associated with treatment noncompliance were discussed. Compliance was encouraged.  

## 2018-07-13 NOTE — Progress Notes (Signed)
[facility] [address] [place], [place] [address] Phone: [phone] Subjective:   Bruce Donath, am serving as a scribe for Dr. Antoine Primas.  I'm seeing this patient by the request  of:  Plotnikov, Georgina Quint, MD   CC: right shoulder pain   [id number]  Angel Clayton is a 37 y.o. female coming in with complaint of right shoulder pain. History of shoulder dislocations. Sharp pain shooting to elbow for 1 month. Pain when sleeping on that side. Does not a slipping or popping. Intermittent pain. Does try to not IR or raise arm up high. Pain is on superior shoulder.       Past Medical History:  Diagnosis Date  . ALLERGIC RHINITIS 07/11/2009  . Hx of breast reduction, elective   . OBESITY 05/16/2007  . Pilonidal cyst    Past Surgical History:  Procedure Laterality Date  . BREAST REDUCTION SURGERY  2005   Social History   Socioeconomic History  . Marital status: Married    Spouse name: Not on file  . Number of children: Not on file  . Years of education: Not on file  . Highest education level: Not on file  Occupational History  . Occupation: Consulting civil engineer  Social Needs  . Financial resource strain: Not on file  . Food insecurity:    Worry: Not on file    Inability: Not on file  . Transportation needs:    Medical: Not on file    Non-medical: Not on file  Tobacco Use  . Smoking status: Never Smoker  . Smokeless tobacco: Never Used  Substance and Sexual Activity  . Alcohol use: Yes  . Drug use: Not on file  . Sexual activity: Not on file  Lifestyle  . Physical activity:    Days per week: Not on file    Minutes per session: Not on file  . Stress: Not on file  Relationships  . Social connections:    Talks on phone: Not on file    Gets together: Not on file    Attends religious service: Not on file    Active member of club or organization: Not on file    Attends meetings of clubs or organizations: Not on file    Relationship status:  Not on file  Other Topics Concern  . Not on file  Social History Narrative  . Not on file   No Known Allergies Family History  Problem Relation Age of Onset  . Hypertension Mother   . Hypertension Father     Current Outpatient Medications (Endocrine & Metabolic):  .  etonogestrel-ethinyl estradiol (NUVARING) 0.12-0.015 MG/24HR vaginal ring, USE AS DIRECTED    Current Outpatient Medications (Analgesics):  .  acetaminophen (TYLENOL) 500 MG tablet, Take 2,000 mg by mouth every 6 (six) hours as needed for mild pain. .  meloxicam (MOBIC) 15 MG tablet, Take 1 tablet (15 mg total) by mouth daily as needed for pain.  Current Outpatient Medications (Hematological):  .  folic acid (V-R FOLIC ACID) 400 MCG tablet, Take 1 tablet (400 mcg total) by mouth daily.  Current Outpatient Medications (Other):  Marland Kitchen  Cholecalciferol (VITAMIN D3) 2000 UNITS capsule, Take 1 capsule (2,000 Units total) by mouth daily. .  Vitamin D, Ergocalciferol, (DRISDOL) 50000 units CAPS capsule, Take 1 capsule (50,000 Units total) by mouth every 7 (seven) days.    Past medical history, social, surgical and family history all reviewed in electronic medical record.  No pertanent information unless  stated regarding to the chief complaint.   Review of Systems:  No headache, visual changes, nausea, vomiting, diarrhea, constipation, dizziness, abdominal pain, skin rash, fevers, chills, night sweats, weight loss, swollen lymph nodes, body aches, joint swelling, , chest pain, shortness of breath, mood changes.  Mild positive muscle aches  Objective  Blood pressure 110/78, pulse 96, height 5\' 6"  (1.676 m), weight 230 lb (104.3 kg).    General: No apparent distress alert and oriented x3 mood and affect normal, dressed appropriately.  HEENT: Pupils equal, extraocular movements intact  Respiratory: Patient's speak in full sentences and does not appear short of breath  Cardiovascular: No lower extremity edema, non tender, no  erythema  Skin: Warm dry intact with no signs of infection or rash on extremities or on axial skeleton.  Abdomen: Soft nontender  Neuro: Cranial nerves II through XII are intact, neurovascularly intact in all extremities with 2+ DTRs and 2+ pulses.  Lymph: No lymphadenopathy of posterior or anterior cervical chain or axillae bilaterally.  Gait normal with good balance and coordination.  MSK:  Non tender with full range of motion and good stability and symmetric strength and tone of  elbows, wrist, hip, knee and ankles bilaterally.  Shoulder: Right Inspection reveals no abnormalities, atrophy or asymmetry. Palpation is normal with no tenderness over AC joint or bicipital groove. ROM is full in all planes passively. Rotator cuff strength normal throughout. signs of impingement with positive Neer and Hawkin's tests, but negative empty can sign. Speeds and Yergason's tests normal. Positive O'Brien's negative crossover Normal scapular function observed. No painful arc and no drop arm sign. No apprehension sign  MSK US performed of: Right This study was ordered, performed, and interpreted by Terrilee Files D.O.  Shoulder:   Supraspinatus:  Appears normal on long and transverse views, Bursal bulge seen with shoulder abduction on impingement view. Infraspinatus:  Appears normal on long and transverse views. Significant increase in Doppler flow Subscapularis:  Appears normal on long and transverse views. Positive bursa Teres Minor:  Appears normal on long and transverse views. AC joint:  Capsule undistended, no geyser sign. Glenohumeral Joint:  Appears normal without effusion. Glenoid Labrum:  Intact without visualized tears. Biceps Tendon:  Appears normal on long and transverse views, no fraying of tendon, tendon located in intertubercular groove, no subluxation with shoulder internal or external rotation.  Impression: Subacromial bursitis  97110; 15 additional minutes spent for Therapeutic  exercises as stated in above notes.  This included exercises focusing on stretching, strengthening, with significant focus on eccentric aspects.   Long term goals include an improvement in range of motion, strength, endurance as well as avoiding reinjury. Patient's frequency would include in 1-2 times a day, 3-5 times a week for a duration of 6-12 weeks.  Shoulder Exercises that included:  Basic scapular stabilization to include adduction and depression of scapula Scaption, focusing on proper movement and good control Internal and External rotation utilizing a theraband, with elbow tucked at side entire time Rows with theraband which was givne  Proper technique shown and discussed handout in great detail with ATC.  All questions were discussed and answered.      Impression and Recommendations:     This case required medical decision making of moderate complexity. The above documentation has been reviewed and is accurate and complete Judi Saa, DO       Note: This dictation was prepared with Dragon dictation along with smaller phrase technology. Any transcriptional errors that result from this process  are unintentional.

## 2018-07-15 ENCOUNTER — Encounter: Payer: Self-pay | Admitting: Family Medicine

## 2018-07-15 ENCOUNTER — Ambulatory Visit: Payer: BC Managed Care – PPO | Admitting: Family Medicine

## 2018-07-15 ENCOUNTER — Ambulatory Visit: Payer: Self-pay

## 2018-07-15 ENCOUNTER — Other Ambulatory Visit: Payer: Self-pay

## 2018-07-15 VITALS — BP 110/78 | HR 96 | Ht 66.0 in | Wt 230.0 lb

## 2018-07-15 DIAGNOSIS — M25511 Pain in right shoulder: Secondary | ICD-10-CM | POA: Diagnosis not present

## 2018-07-15 DIAGNOSIS — G8929 Other chronic pain: Secondary | ICD-10-CM | POA: Diagnosis not present

## 2018-07-15 MED ORDER — DICLOFENAC SODIUM 2 % TD SOLN: 2.0000 g | Freq: Two times a day (BID) | TRANSDERMAL | 3 refills | Status: DC

## 2018-07-15 MED ORDER — VITAMIN D (ERGOCALCIFEROL) 1.25 MG (50000 UNIT) PO CAPS: 50000.0000 [IU] | ORAL_CAPSULE | ORAL | 0 refills | Status: DC

## 2018-07-15 NOTE — Patient Instructions (Addendum)
Good to see you  Ice 20 minutes 2 times daily. Usually after activity and before bed. pennsaid pinkie amount topically 2 times daily as needed.  Exercises 3 times a week.  Keep hands within peripheral vision  pennsaid pinkie amount topically 2 times daily as needed.  Once weekly vitamin D for 12 weeks  Change desk at work  See em again in 4-6 weeks

## 2018-07-15 NOTE — Assessment & Plan Note (Signed)
Patient is a more of a shoulder bursitis.  Concern for possible labral pathology as well.  We discussed with patient doing that I do not feel any significant instability at this moment though.  We discussed different treatment options and patient has elected to try conservative therapy.  Given exercises, icing regimen, topical anti-inflammatories, started once weekly vitamin D for muscle strength and endurance.  Discussed which activities to do which wants to avoid.  Follow-up again in 4 to 8 weeks

## 2018-07-15 NOTE — Progress Notes (Signed)
That

## 2018-08-11 NOTE — Progress Notes (Deleted)
[facility] [address] [place], [place] [address] Phone: [phone] Subjective:    I'm seeing this patient by the request  of:    CC:   BJY:NWGNFAOZHY  CORLETTE Angel Clayton is a 37 y.o. female coming in with complaint of ***  Onset-  Location Duration-  Character- Aggravating factors- Reliving factors-  Therapies tried-  Severity-     Past Medical History:  Diagnosis Date  . ALLERGIC RHINITIS 07/11/2009  . Hx of breast reduction, elective   . OBESITY 05/16/2007  . Pilonidal cyst    Past Surgical History:  Procedure Laterality Date  . BREAST REDUCTION SURGERY  2005   Social History   Socioeconomic History  . Marital status: Married    Spouse name: Not on file  . Number of children: Not on file  . Years of education: Not on file  . Highest education level: Not on file  Occupational History  . Occupation: Consulting civil engineer  Social Needs  . Financial resource strain: Not on file  . Food insecurity:    Worry: Not on file    Inability: Not on file  . Transportation needs:    Medical: Not on file    Non-medical: Not on file  Tobacco Use  . Smoking status: Never Smoker  . Smokeless tobacco: Never Used  Substance and Sexual Activity  . Alcohol use: Yes  . Drug use: Not on file  . Sexual activity: Not on file  Lifestyle  . Physical activity:    Days per week: Not on file    Minutes per session: Not on file  . Stress: Not on file  Relationships  . Social connections:    Talks on phone: Not on file    Gets together: Not on file    Attends religious service: Not on file    Active member of club or organization: Not on file    Attends meetings of clubs or organizations: Not on file    Relationship status: Not on file  Other Topics Concern  . Not on file  Social History Narrative  . Not on file   No Known Allergies Family History  Problem Relation Age of Onset  . Hypertension Mother   . Hypertension Father     Current Outpatient  Medications (Endocrine & Metabolic):  .  etonogestrel-ethinyl estradiol (NUVARING) 0.12-0.015 MG/24HR vaginal ring, USE AS DIRECTED    Current Outpatient Medications (Analgesics):  .  acetaminophen (TYLENOL) 500 MG tablet, Take 2,000 mg by mouth every 6 (six) hours as needed for mild pain. .  meloxicam (MOBIC) 15 MG tablet, Take 1 tablet (15 mg total) by mouth daily as needed for pain.  Current Outpatient Medications (Hematological):  .  folic acid (V-R FOLIC ACID) 400 MCG tablet, Take 1 tablet (400 mcg total) by mouth daily.  Current Outpatient Medications (Other):  Marland Kitchen  Cholecalciferol (VITAMIN D3) 2000 UNITS capsule, Take 1 capsule (2,000 Units total) by mouth daily. .  Diclofenac Sodium 2 % SOLN, Place 2 g onto the skin 2 (two) times daily. .  Vitamin D, Ergocalciferol, (DRISDOL) 50000 units CAPS capsule, Take 1 capsule (50,000 Units total) by mouth every 7 (seven) days.    Past medical history, social, surgical and family history all reviewed in electronic medical record.  No pertanent information unless stated regarding to the chief complaint.   Review of Systems:  No headache, visual changes, nausea, vomiting, diarrhea, constipation, dizziness, abdominal pain, skin rash, fevers, chills, night sweats, weight loss,  swollen lymph nodes, body aches, joint swelling, muscle aches, chest pain, shortness of breath, mood changes.   Objective  There were no vitals taken for this visit. Systems examined below as of    General: No apparent distress alert and oriented x3 mood and affect normal, dressed appropriately.  HEENT: Pupils equal, extraocular movements intact  Respiratory: Patient's speak in full sentences and does not appear short of breath  Cardiovascular: No lower extremity edema, non tender, no erythema  Skin: Warm dry intact with no signs of infection or rash on extremities or on axial skeleton.  Abdomen: Soft nontender  Neuro: Cranial nerves II through XII are intact,  neurovascularly intact in all extremities with 2+ DTRs and 2+ pulses.  Lymph: No lymphadenopathy of posterior or anterior cervical chain or axillae bilaterally.  Gait normal with good balance and coordination.  MSK:  Non tender with full range of motion and good stability and symmetric strength and tone of shoulders, elbows, wrist, hip, knee and ankles bilaterally.     Impression and Recommendations:     This case required medical decision making of moderate complexity. The above documentation has been reviewed and is accurate and complete Judi Saa, DO       Note: This dictation was prepared with Dragon dictation along with smaller phrase technology. Any transcriptional errors that result from this process are unintentional.

## 2018-08-12 ENCOUNTER — Ambulatory Visit: Payer: BC Managed Care – PPO | Admitting: Family Medicine

## 2018-09-01 NOTE — Progress Notes (Signed)
[facility] [address] [place], [place] [address] Phone: [phone] Subjective:    I Angel Clayton am serving as a Neurosurgeon for Dr. Antoine Primas.   CC: Shoulder pain follow-up  BJY:NWGNFAOZHY  Angel Clayton is a 37 y.o. female coming in with complaint of shoulder pain. Still has some pain.  Would state that she is about 85% better.  Has not had any significant subluxation or dislocation.  Doing the exercises occasionally.  Sleeping more comfortably at night.  Denies any numbness or tingling.    Past Medical History:  Diagnosis Date  . ALLERGIC RHINITIS 07/11/2009  . Hx of breast reduction, elective   . OBESITY 05/16/2007  . Pilonidal cyst    Past Surgical History:  Procedure Laterality Date  . BREAST REDUCTION SURGERY  2005   Social History   Socioeconomic History  . Marital status: Married    Spouse name: Not on file  . Number of children: Not on file  . Years of education: Not on file  . Highest education level: Not on file  Occupational History  . Occupation: Consulting civil engineer  Social Needs  . Financial resource strain: Not on file  . Food insecurity:    Worry: Not on file    Inability: Not on file  . Transportation needs:    Medical: Not on file    Non-medical: Not on file  Tobacco Use  . Smoking status: Never Smoker  . Smokeless tobacco: Never Used  Substance and Sexual Activity  . Alcohol use: Yes  . Drug use: Not on file  . Sexual activity: Not on file  Lifestyle  . Physical activity:    Days per week: Not on file    Minutes per session: Not on file  . Stress: Not on file  Relationships  . Social connections:    Talks on phone: Not on file    Gets together: Not on file    Attends religious service: Not on file    Active member of club or organization: Not on file    Attends meetings of clubs or organizations: Not on file    Relationship status: Not on file  Other Topics Concern  . Not on file  Social History Narrative  .  Not on file   No Known Allergies Family History  Problem Relation Age of Onset  . Hypertension Mother   . Hypertension Father     Current Outpatient Medications (Endocrine & Metabolic):  .  etonogestrel-ethinyl estradiol (NUVARING) 0.12-0.015 MG/24HR vaginal ring, USE AS DIRECTED    Current Outpatient Medications (Analgesics):  .  acetaminophen (TYLENOL) 500 MG tablet, Take 2,000 mg by mouth every 6 (six) hours as needed for mild pain. .  meloxicam (MOBIC) 15 MG tablet, Take 1 tablet (15 mg total) by mouth daily as needed for pain.  Current Outpatient Medications (Hematological):  .  folic acid (V-R FOLIC ACID) 400 MCG tablet, Take 1 tablet (400 mcg total) by mouth daily.  Current Outpatient Medications (Other):  Marland Kitchen  Cholecalciferol (VITAMIN D3) 2000 UNITS capsule, Take 1 capsule (2,000 Units total) by mouth daily. .  Diclofenac Sodium 2 % SOLN, Place 2 g onto the skin 2 (two) times daily. .  Vitamin D, Ergocalciferol, (DRISDOL) 50000 units CAPS capsule, Take 1 capsule (50,000 Units total) by mouth every 7 (seven) days.    Past medical history, social, surgical and family history all reviewed in electronic medical record.  No pertanent information unless stated regarding to the  chief complaint.   Review of Systems:  No headache, visual changes, nausea, vomiting, diarrhea, constipation, dizziness, abdominal pain, skin rash, fevers, chills, night sweats, weight loss, swollen lymph nodes, body aches, joint swelling, muscle aches, chest pain, shortness of breath, mood changes.   Objective  There were no vitals taken for this visit. Systems examined below as of    General: No apparent distress alert and oriented x3 mood and affect normal, dressed appropriately.  HEENT: Pupils equal, extraocular movements intact  Respiratory: Patient's speak in full sentences and does not appear short of breath  Cardiovascular: No lower extremity edema, non tender, no erythema  Skin: Warm dry intact  with no signs of infection or rash on extremities or on axial skeleton.  Abdomen: Soft nontender  Neuro: Cranial nerves II through XII are intact, neurovascularly intact in all extremities with 2+ DTRs and 2+ pulses.  Lymph: No lymphadenopathy of posterior or anterior cervical chain or axillae bilaterally.  Gait normal with good balance and coordination.  MSK:  Non tender with full range of motion and good stability and symmetric strength and tone of  elbows, wrist, hip, knee and ankles bilaterally.  Right shoulder exam is fairly unremarkable except for a positive O'Brien sign.  Rotator cuff strength 5 out of 5.  Still does have some increasing range of motion bilaterally with some mild instability.  Patient though does have great grip strength.    Impression and Recommendations:     This case required medical decision making of moderate complexity. The above documentation has been reviewed and is accurate and complete Judi SaaZachary M Linna Thebeau, DO       Note: This dictation was prepared with Dragon dictation along with smaller phrase technology. Any transcriptional errors that result from this process are unintentional.

## 2018-09-02 ENCOUNTER — Ambulatory Visit (INDEPENDENT_AMBULATORY_CARE_PROVIDER_SITE_OTHER)
Admission: RE | Admit: 2018-09-02 | Discharge: 2018-09-02 | Disposition: A | Discharge status: Home / Self Care | Payer: BC Managed Care – PPO | Source: Ambulatory Visit | Attending: Family Medicine | Admitting: Family Medicine

## 2018-09-02 ENCOUNTER — Ambulatory Visit: Payer: BC Managed Care – PPO | Admitting: Family Medicine

## 2018-09-02 ENCOUNTER — Encounter: Payer: Self-pay | Admitting: Family Medicine

## 2018-09-02 VITALS — BP 132/90 | HR 91 | Ht 66.0 in | Wt 235.0 lb

## 2018-09-02 DIAGNOSIS — G8929 Other chronic pain: Secondary | ICD-10-CM

## 2018-09-02 DIAGNOSIS — M25511 Pain in right shoulder: Secondary | ICD-10-CM

## 2018-09-02 MED ORDER — HYDROXYZINE HCL 10 MG PO TABS: 10.0000 mg | ORAL_TABLET | Freq: Three times a day (TID) | ORAL | 0 refills | Status: DC | PRN

## 2018-09-02 NOTE — Assessment & Plan Note (Signed)
Patient is doing relatively well.  Patient has had more of a labral pathology.  Discussed icing regimen and home exercise.  Discussed which activities to do which wants to avoid.  X-rays ordered today.  Patient wants to continue conservative therapy.  Follow-up again in 3 to 4 weeks

## 2018-09-02 NOTE — Patient Instructions (Signed)
Good to see you  Xray downstairs Ice is your friend Stay active Keep doing the exercises  Hydroxyzine up to 3 times a day  See me again in 6-8 weeks if not perfect Happy holidays!

## 2018-09-06 DIAGNOSIS — H4710 Unspecified papilledema: Secondary | ICD-10-CM

## 2018-09-06 HISTORY — DX: Unspecified papilledema: H47.10

## 2018-09-09 ENCOUNTER — Encounter: Payer: Self-pay | Admitting: Internal Medicine

## 2018-09-09 ENCOUNTER — Ambulatory Visit: Payer: BC Managed Care – PPO | Admitting: Internal Medicine

## 2018-09-09 DIAGNOSIS — J029 Acute pharyngitis, unspecified: Secondary | ICD-10-CM | POA: Insufficient documentation

## 2018-09-09 LAB — POCT RAPID STREP A (OFFICE): Rapid Strep A Screen: NEGATIVE

## 2018-09-09 MED ORDER — AMOXICILLIN-POT CLAVULANATE 875-125 MG PO TABS: 1.0000 | ORAL_TABLET | Freq: Two times a day (BID) | ORAL | 0 refills | Status: AC

## 2018-09-09 NOTE — Patient Instructions (Signed)
We have sent in augmentin to take 1 pill twice a day for 1 week for the sore throat.

## 2018-09-09 NOTE — Progress Notes (Signed)
   Subjective:    Patient ID: Angel Clayton, female    DOB: Jun 11, 1981, 37 y.o.   MRN: [id number]  HPI The patient is a 37 YO female coming in for headaches, chills and sore throat for about 1 week. Denies body aches or coughing or nasal drainage. Is not taking anything for this. Overall it is worsening. She has had strep before and this felt similar. She is having pain with swallowing and is not eating as much. Denies fevers. Took tylenol for headaches last night and this helped her to get to sleep but facial pain and headache again today.    Review of Systems  Constitutional: Positive for activity change, appetite change, chills and fatigue. Negative for fever and unexpected weight change.  HENT: Positive for sinus pressure, sinus pain, sore throat and trouble swallowing. Negative for congestion, ear discharge, ear pain, postnasal drip, rhinorrhea, sneezing, tinnitus and voice change.   Eyes: Negative.   Respiratory: Negative for cough, chest tightness, shortness of breath and wheezing.   Cardiovascular: Negative.   Gastrointestinal: Negative.   Neurological: Negative.       Objective:   Physical Exam  Constitutional: She is oriented to person, place, and time. She appears well-developed and well-nourished.  HENT:  Head: Normocephalic and atraumatic.  Oropharynx with redness and drainage, nose with swollen turbinates, TMs normal bilaterally, facial pain with palpation frontal sinuses  Eyes: EOM are normal.  Neck: Normal range of motion. No thyromegaly present.  Some shotty LAD cervical  Cardiovascular: Normal rate and regular rhythm.  Pulmonary/Chest: Effort normal and breath sounds normal. No respiratory distress. She has no wheezes. She has no rales.  Abdominal: Soft.  Musculoskeletal: She exhibits tenderness.  Lymphadenopathy:    She has cervical adenopathy.  Neurological: She is alert and oriented to person, place, and time.  Skin: Skin is warm and dry.   Vitals:   09/09/18  1035  BP: 130/82  Pulse: 83  Temp: 98.2 F (36.8 C)  TempSrc: Oral  SpO2: 97%  Weight: 236 lb (107 kg)  Height: 5\' 6"  (1.676 m)      Assessment & Plan:

## 2018-09-09 NOTE — Assessment & Plan Note (Signed)
Strep test done in the office negative but given length and symptoms will treat with augmentin 1 week.

## 2018-09-16 ENCOUNTER — Emergency Department (HOSPITAL_COMMUNITY)
Admission: EM | Admit: 2018-09-16 | Discharge: 2018-09-16 | Disposition: A | Discharge status: Home / Self Care | Payer: BC Managed Care – PPO | Attending: Emergency Medicine | Admitting: Emergency Medicine

## 2018-09-16 ENCOUNTER — Telehealth: Payer: Self-pay | Admitting: Neurology

## 2018-09-16 ENCOUNTER — Emergency Department (HOSPITAL_COMMUNITY): Payer: BC Managed Care – PPO

## 2018-09-16 ENCOUNTER — Encounter (HOSPITAL_COMMUNITY): Payer: Self-pay | Admitting: Emergency Medicine

## 2018-09-16 DIAGNOSIS — H4710 Unspecified papilledema: Secondary | ICD-10-CM | POA: Diagnosis not present

## 2018-09-16 DIAGNOSIS — Z79899 Other long term (current) drug therapy: Secondary | ICD-10-CM | POA: Insufficient documentation

## 2018-09-16 DIAGNOSIS — R51 Headache: Secondary | ICD-10-CM | POA: Diagnosis present

## 2018-09-16 LAB — CBC
HCT: 37.6 % (ref 36.0–46.0)
Hemoglobin: 11.7 g/dL — ABNORMAL LOW (ref 12.0–15.0)
MCH: 29.1 pg (ref 26.0–34.0)
MCHC: 31.1 g/dL (ref 30.0–36.0)
MCV: 93.5 fL (ref 80.0–100.0)
Platelets: 340 10*3/uL (ref 150–400)
RBC: 4.02 MIL/uL (ref 3.87–5.11)
RDW: 12.3 % (ref 11.5–15.5)
WBC: 9.1 10*3/uL (ref 4.0–10.5)
nRBC: 0 % (ref 0.0–0.2)

## 2018-09-16 LAB — BASIC METABOLIC PANEL
Anion gap: 10 (ref 5–15)
BUN: 11 mg/dL (ref 6–20)
CO2: 22 mmol/L (ref 22–32)
Calcium: 8.9 mg/dL (ref 8.9–10.3)
Chloride: 106 mmol/L (ref 98–111)
Creatinine, Ser: 1.02 mg/dL — ABNORMAL HIGH (ref 0.44–1.00)
GFR calc Af Amer: >60 mL/min (ref >60)
GFR calc non Af Amer: >60 mL/min (ref >60)
Glucose, Bld: 99 mg/dL (ref 70–99)
Potassium: 3.9 mmol/L (ref 3.5–5.1)
SODIUM: 138 mmol/L (ref 135–145)

## 2018-09-16 MED ORDER — LORAZEPAM 2 MG/ML IJ SOLN
1.0000 mg | Freq: Once | INTRAMUSCULAR | Status: AC
  Administered 2018-09-16: 1 mg via INTRAVENOUS
  Filled 2018-09-16: qty 1

## 2018-09-16 MED ORDER — MIDAZOLAM HCL 2 MG/2ML IJ SOLN
4.0000 mg | Freq: Once | INTRAMUSCULAR | Status: AC
  Administered 2018-09-16: 4 mg via INTRAVENOUS
  Filled 2018-09-16: qty 4

## 2018-09-16 MED ORDER — ACETAZOLAMIDE 125 MG PO TABS: 500.0000 mg | ORAL_TABLET | Freq: Two times a day (BID) | ORAL | 0 refills | Status: DC

## 2018-09-16 MED ORDER — HYDROXYZINE HCL 25 MG PO TABS
25.0000 mg | ORAL_TABLET | Freq: Once | ORAL | Status: AC
  Administered 2018-09-16: 25 mg via ORAL
  Filled 2018-09-16: qty 1

## 2018-09-16 MED ORDER — HYDRALAZINE HCL 25 MG PO TABS: 25.0000 mg | ORAL_TABLET | Freq: Once | ORAL | Status: DC

## 2018-09-16 MED ORDER — GADOBUTROL 1 MMOL/ML IV SOLN
10.0000 mL | Freq: Once | INTRAVENOUS | Status: AC | PRN
  Administered 2018-09-16: 10 mL via INTRAVENOUS

## 2018-09-16 MED ORDER — MIDAZOLAM HCL 5 MG/5ML IJ SOLN
4.0000 mg | Freq: Once | INTRAMUSCULAR | Status: DC
  Filled 2018-09-16: qty 4

## 2018-09-16 NOTE — Discharge Instructions (Addendum)
You should receive call from radiology in the morning. We are giving a prescription for acetazolamide.  Please begin this after the lumbar puncture tomorrow Please call Dr. Zannie CoveYan's office for appointment asap Return to the emergency department you have having worsening symptoms such as vision changes or worsening headache

## 2018-09-16 NOTE — ED Provider Notes (Signed)
[facility] EMERGENCY DEPARTMENT Provider Note   CSN: [id number] Arrival date & time: 09/16/18  1615     History   Chief Complaint Chief Complaint  Patient presents with  . Eye Problem    HPI Angel Clayton is a 37 y.o. female.  HPI  37 year old female sent from ophthalmologist office with reports of bilateral papilledema.  States she presented for a standard eye exam.  She has been having some headaches.  These have been mild and diffuse in nature.  She chiefly has been having some right shoulder pain.  She has been seen for this and started on hydralazine.  Noticed some flashing in her eyes.  Other than that she has not noted any real change in her vision.  She has not had any previous similar symptoms. Note from Dr. Cammie Mcgee office says please evaluate for papilledema.  Recommend MRI/MRV with and without chronic contrast and neuro hospitalist consult. Past Medical History:  Diagnosis Date  . ALLERGIC RHINITIS 07/11/2009  . Hx of breast reduction, elective   . OBESITY 05/16/2007  . Pilonidal cyst     Patient Active Problem List   Diagnosis Date Noted  . Sore throat 09/09/2018  . Chronic right shoulder pain 06/29/2018  . Acute upper respiratory infection 03/05/2016  . Eustachian tube dysfunction 03/05/2016  . 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 Hypocalcemia 06/02/2015  . Anemia 06/02/2015  . Vitamin D deficiency 06/02/2015  . Preop exam for internal medicine 06/02/2015  . Insomnia 08/24/2013  . Pilonidal cyst with abscess 06/07/2011  . ALLERGIC RHINITIS 07/11/2009  . Obesity 05/16/2007    Past Surgical History:  Procedure Laterality Date  . BREAST REDUCTION SURGERY  2005     OB History    Gravida  1   Para  0   Term      Preterm      AB  1   Living        SAB      TAB  1   Ectopic      Multiple      Live Births               Home Medications    Prior to Admission medications   Medication Sig Start Date End Date Taking?  Authorizing Provider  acetaminophen (TYLENOL) 500 MG tablet Take 2,000 mg by mouth every 6 (six) hours as needed for mild pain.    [provider]  amoxicillin-clavulanate (AUGMENTIN) 875-125 MG tablet Take 1 tablet by mouth 2 (two) times daily for 7 days. 09/09/18 09/16/18  Myrlene Broker, MD  Cholecalciferol (VITAMIN D3) 2000 UNITS capsule Take 1 capsule (2,000 Units total) by mouth daily. 06/02/15   Plotnikov, Georgina Quint, MD  Diclofenac Sodium 2 % SOLN Place 2 g onto the skin 2 (two) times daily. 07/15/18   Judi Saa, DO  etonogestrel-ethinyl estradiol (NUVARING) 0.12-0.015 MG/24HR vaginal ring USE AS DIRECTED 04/06/15   [provider]  folic acid (V-R FOLIC ACID) 400 MCG tablet Take 1 tablet (400 mcg total) by mouth daily. 06/29/18   Plotnikov, Georgina Quint, MD  hydrOXYzine (ATARAX/VISTARIL) 10 MG tablet Take 1 tablet (10 mg total) by mouth 3 (three) times daily as needed. 09/02/18   Judi Saa, DO  meloxicam (MOBIC) 15 MG tablet Take 1 tablet (15 mg total) by mouth daily as needed for pain. 06/29/18   Plotnikov, Georgina Quint, MD  Vitamin D, Ergocalciferol, (DRISDOL) 50000 units CAPS capsule Take 1 capsule (  50,000 Units total) by mouth every 7 (seven) days. 07/15/18   Judi Saa, DO    Family History Family History  Problem Relation Age of Onset  . Hypertension Mother   . Hypertension Father     Social History Social History   Tobacco Use  . Smoking status: Never Smoker  . Smokeless tobacco: Never Used  Substance Use Topics  . Alcohol use: Yes  . Drug use: Not on file     Allergies   Patient has no known allergies.   Review of Systems Review of Systems  All other systems reviewed and are negative.    Physical Exam Updated Vital Signs BP (!) 141/102 (BP Location: Right Arm)   Pulse 89   Temp 98.9 F (37.2 C) (Oral)   Resp 16   Ht 1.676 m (5\' 6" )   Wt 107 kg   SpO2 99%   BMI 38.09 kg/m   Physical Exam  Constitutional: She is  oriented to person, place, and time. She appears well-developed and well-nourished.  HENT:  Head: Normocephalic.  Right Ear: External ear normal.  Left Ear: External ear normal.  Nose: Nose normal.  Mouth/Throat: Oropharynx is clear and moist.  Eyes: Conjunctivae, EOM and lids are normal.  Pupils dilated from exam Papilledema noted  Neck: Normal range of motion.  Cardiovascular: Normal rate, regular rhythm, normal heart sounds and intact distal pulses.  Pulmonary/Chest: Effort normal and breath sounds normal.  Abdominal: Soft. Bowel sounds are normal.  Musculoskeletal: Normal range of motion.  Neurological: She is alert and oriented to person, place, and time.  Skin: Skin is warm and dry. Capillary refill takes less than 2 seconds.  Psychiatric: She has a normal mood and affect. Her behavior is normal. Judgment and thought content normal.  Nursing note and vitals reviewed.    ED Treatments / Results  Labs (all labs ordered are listed, but only abnormal results are displayed) Labs Reviewed - No data to display  EKG None  Radiology Mr Laqueta Jean And Wo Contrast  Result Date: 09/16/2018 CLINICAL DATA:  Worsening headache, follow-up papilledema. EXAM: MRI HEAD WITH CONTRAST MRV HEAD WITHOUT CONTRAST TECHNIQUE: Multiplanar, multiecho pulse sequences of the brain and surrounding structures were obtained with intravenous contrast. Angiographic images of the intracranial venous structures were obtained using MRV technique without intravenous contrast. COMPARISON:  None. CONTRAST:  10 cc Gadavist FINDINGS: MR HEAD FINDINGS-mild motion degraded examination. INTRACRANIAL CONTENTS: No reduced diffusion to suggest acute ischemia or hyperacute demyelination. No susceptibility artifact to suggest hemorrhage. The ventricles and sulci are normal for patient's age. No suspicious parenchymal signal, masses, mass effect. No abnormal intraparenchymal or extra-axial enhancement. No abnormal extra-axial  fluid collections. No extra-axial masses. VASCULAR: Normal major intracranial vascular flow voids present at skull base. SKULL AND UPPER CERVICAL SPINE: Partially empty sella. No suspicious calvarial bone marrow signal. Craniocervical junction maintained. SINUSES/ORBITS: The mastoid air-cells and included paranasal sinuses are well-aerated.The included ocular globes and orbital contents are non-suspicious. OTHER: None. MRV HEAD FINDINGS Normal flow related enhancement within the superior sagittal sinus, torcula of the Herophili, bilateral transverse, sigmoid sinuses and included internal jugular veins. RIGHT transverse sinus is dominant. Stenotic distal RIGHT transverse sinuses. Normal flow related enhancement of the internal cerebral veins. IMPRESSION: MRI head: 1. Mild motion degraded examination.  No acute intracranial process. 2. Partially empty sella associated with intracranial hypertension. 3. Otherwise unremarkable MRI head with and without contrast. MRV head: 1. No emergent large vessel occlusion. 2. Stenotic dominant RIGHT transverse sinus  seen with intracranial hypertension. Electronically Signed   By: Awilda Metroourtnay  Bloomer M.D.   On: 09/16/2018 19:45   Mr Mrv Head Wo Cm  Result Date: 09/16/2018 CLINICAL DATA:  Worsening headache, follow-up papilledema. EXAM: MRI HEAD WITH CONTRAST MRV HEAD WITHOUT CONTRAST TECHNIQUE: Multiplanar, multiecho pulse sequences of the brain and surrounding structures were obtained with intravenous contrast. Angiographic images of the intracranial venous structures were obtained using MRV technique without intravenous contrast. COMPARISON:  None. CONTRAST:  10 cc Gadavist FINDINGS: MR HEAD FINDINGS-mild motion degraded examination. INTRACRANIAL CONTENTS: No reduced diffusion to suggest acute ischemia or hyperacute demyelination. No susceptibility artifact to suggest hemorrhage. The ventricles and sulci are normal for patient's age. No suspicious parenchymal signal, masses,  mass effect. No abnormal intraparenchymal or extra-axial enhancement. No abnormal extra-axial fluid collections. No extra-axial masses. VASCULAR: Normal major intracranial vascular flow voids present at skull base. SKULL AND UPPER CERVICAL SPINE: Partially empty sella. No suspicious calvarial bone marrow signal. Craniocervical junction maintained. SINUSES/ORBITS: The mastoid air-cells and included paranasal sinuses are well-aerated.The included ocular globes and orbital contents are non-suspicious. OTHER: None. MRV HEAD FINDINGS Normal flow related enhancement within the superior sagittal sinus, torcula of the Herophili, bilateral transverse, sigmoid sinuses and included internal jugular veins. RIGHT transverse sinus is dominant. Stenotic distal RIGHT transverse sinuses. Normal flow related enhancement of the internal cerebral veins. IMPRESSION: MRI head: 1. Mild motion degraded examination.  No acute intracranial process. 2. Partially empty sella associated with intracranial hypertension. 3. Otherwise unremarkable MRI head with and without contrast. MRV head: 1. No emergent large vessel occlusion. 2. Stenotic dominant RIGHT transverse sinus seen with intracranial hypertension. Electronically Signed   By: Awilda Metroourtnay  Bloomer M.D.   On: 09/16/2018 19:45    Procedures Procedures (including critical care time)  Medications Ordered in ED Medications - No data to display   Initial Impression / Assessment and Plan / ED Course  I have reviewed the triage vital signs and the nursing notes.  Pertinent labs & imaging results that were available during my care of the patient were reviewed by me and considered in my medical decision making (see chart for details).     5:21 PM Discussed with Dr. Otelia LimesLindzen 9:47 PM Discussed MRI results with Dr. Amada JupiterKirkpatrick.  Patient is not having significant symptoms.  She was seen today for a standard eye exam.  We will consult interventional radiology for outpatient lumbar  puncture.  Patient was referred to Dr. Terrace ArabiaYan is advised to call tomorrow and she will be given prescription for Acetazolamide to start as soon as LP is done  Discussed with Dr. Fredia SorrowYamagata.  They will call her in the morning.  She is referred to Dr. Terrace ArabiaYan.  We have discussed what she needs to do and she voices understanding of plan. Final Clinical Impressions(s) / ED Diagnoses   Final diagnoses:  Papilledema    ED Discharge Orders    None       Margarita Grizzleay, Moshe Wenger, MD 09/16/18 2201

## 2018-09-16 NOTE — ED Triage Notes (Signed)
Pt reports she was at her annual eye exam and told she had swelling behind her optic nerve. Pt reports frequent HAs for years and has been seeing flashes of lights.

## 2018-09-16 NOTE — ED Notes (Signed)
ED Provider at bedside. 

## 2018-09-16 NOTE — Telephone Encounter (Signed)
I received a phone call from her ophthalmologist Dr. Laruth BouchardGroat's office  Patient was seen for increased headache bilateral papilledema on examination,  I have suggested MRI of the brain without contrast  Please call patient at [phone] for an appointment as soon as possible

## 2018-09-16 NOTE — ED Notes (Signed)
Ativan verbal placed MRI.

## 2018-09-16 NOTE — ED Notes (Signed)
Patient transported to MRI 

## 2018-09-16 NOTE — Telephone Encounter (Signed)
Dr. Terrace ArabiaYan has been notified of this update concerning this patient.

## 2018-09-16 NOTE — Telephone Encounter (Signed)
Jaz with [facility] called stating the pt is going straight to the ED fyi

## 2018-09-17 ENCOUNTER — Ambulatory Visit (HOSPITAL_COMMUNITY)
Admission: RE | Admit: 2018-09-17 | Discharge: 2018-09-17 | Disposition: A | Discharge status: Home / Self Care | Payer: BC Managed Care – PPO | Source: Ambulatory Visit | Attending: Medical | Admitting: Medical

## 2018-09-17 ENCOUNTER — Other Ambulatory Visit (HOSPITAL_COMMUNITY): Payer: Self-pay | Admitting: Medical

## 2018-09-17 DIAGNOSIS — H579 Unspecified disorder of eye and adnexa: Secondary | ICD-10-CM | POA: Insufficient documentation

## 2018-09-17 MED ORDER — ACETAMINOPHEN 325 MG PO TABS: 650.0000 mg | ORAL_TABLET | ORAL | Status: DC | PRN

## 2018-09-17 MED ORDER — LIDOCAINE HCL (PF) 1 % IJ SOLN
5.0000 mL | Freq: Once | INTRAMUSCULAR | Status: AC
  Administered 2018-09-17: 5 mL via INTRADERMAL

## 2018-09-17 NOTE — Progress Notes (Addendum)
No orders for care, Dr Virl Diamondchuck Chestine Sporelark has been paged.

## 2018-09-17 NOTE — Discharge Instructions (Signed)
Lumbar Puncture, Care After °Refer to this sheet in the next few weeks. These instructions provide you with information on caring for yourself after your procedure. Your health care provider may also give you more specific instructions. Your treatment has been planned according to current medical practices, but problems sometimes occur. Call your health care provider if you have any problems or questions after your procedure. °What can I expect after the procedure? °After your procedure, it is typical to have the following sensations: °· Mild discomfort or pain at the insertion site. °· Mild headache that is relieved with pain medicines. ° °Follow these instructions at home: ° °· Avoid lifting anything heavier than 10 lb (4.5 kg) for at least 12 hours after the procedure. °· Drink enough fluids to keep your urine clear or pale yellow. °Contact a health care provider if: °· You have fever or chills. °· You have nausea or vomiting. °· You have a headache that lasts for more than 2 days. °Get help right away if: °· You have any numbness or tingling in your legs. °· You are unable to control your bowel or bladder. °· You have bleeding or swelling in your back at the insertion site. °· You are dizzy or faint. °This information is not intended to replace advice given to you by your health care provider. Make sure you discuss any questions you have with your health care provider. °Document Released: 09/28/2013 Document Revised: 02/29/2016 Document Reviewed: 06/01/2013 °Elsevier Interactive Patient Education © 2017 Elsevier Inc. ° °

## 2018-09-23 ENCOUNTER — Ambulatory Visit: Payer: BC Managed Care – PPO | Admitting: Diagnostic Neuroimaging

## 2018-09-23 ENCOUNTER — Encounter: Payer: Self-pay | Admitting: Diagnostic Neuroimaging

## 2018-09-23 VITALS — BP 144/92 | HR 89 | Ht 66.0 in | Wt 232.0 lb

## 2018-09-23 DIAGNOSIS — G932 Benign intracranial hypertension: Secondary | ICD-10-CM | POA: Diagnosis not present

## 2018-09-23 DIAGNOSIS — G43109 Migraine with aura, not intractable, without status migrainosus: Secondary | ICD-10-CM | POA: Diagnosis not present

## 2018-09-23 MED ORDER — ACETAZOLAMIDE 250 MG PO TABS: 500.0000 mg | ORAL_TABLET | Freq: Two times a day (BID) | ORAL | 12 refills | Status: DC

## 2018-09-23 NOTE — Progress Notes (Signed)
[facility]  PATIENT: Angel Clayton DOB: [birth year]  REFERRING CLINICIAN: ER  HISTORY FROM: patient  REASON FOR VISIT: new consult    HISTORICAL  CHIEF COMPLAINT:  Chief Complaint  Patient presents with  . Bilateral papilledema    rm 6, New Pt, ED referral, "saw eye dr for routine exam, was sent to ED- had MRI, LP, started Diamox"    HISTORY OF PRESENT ILLNESS:   37 year old female here for evaluation of papilledema.  For past 2 years patient has had pressure sensation of her eyes, sharp and throbbing headaches with photophobia and nausea.  No transient visual obscuration.  Sometimes she hears a "whooshing" sound in her ears.  Symptoms seem to be worse with her menstrual cycle.  Recently patient was having "floaters" with bright spots moving in her visual field.  She went to ophthalmologist for evaluation and was diagnosed with severe bilateral papilledema.  She was referred to the emergency room for evaluation.  Patient had imaging studies and lumbar puncture which confirmed slightly elevated opening pressure of 25 cm water.  Patient was started on acetazolamide and referred to outpatient neurology follow-up for new diagnosis of idiopathic intracranial hypertension (pseudotumor cerebri).  She is tolerating acetazolamide well.  No side effects.  Patient's mother also has diagnosis of idiopathic intracranial hypertension (pseudotumor cerebri).  Patient has history of obesity, with maximum weight of 300 pounds several years ago.  She underwent gastric sleeve surgery and lost weight down to 215 pounds.  Over the past 6 months she has regained some weight and now is up to 235 pounds.    REVIEW OF SYSTEMS: Full 14 system review of systems performed and negative with exception of: Shortness of breath headache insomnia.   ALLERGIES: No Known Allergies  HOME MEDICATIONS: Outpatient Medications Prior to Visit  Medication Sig Dispense Refill  . acetaminophen  (TYLENOL) 500 MG tablet Take 1,000 mg by mouth every 6 (six) hours as needed for mild pain.     Marland Kitchen. acetaZOLAMIDE (DIAMOX) 125 MG tablet Take 4 tablets (500 mg total) by mouth 2 (two) times daily. 60 tablet 0  . Diclofenac Sodium 2 % SOLN Place 2 g onto the skin 2 (two) times daily. 112 g 3  . doxylamine, Sleep, (UNISOM) 25 MG tablet Take 25 mg by mouth at bedtime as needed for sleep.    Marland Kitchen. etonogestrel-ethinyl estradiol (NUVARING) 0.12-0.015 MG/24HR vaginal ring Place 1 each vaginally every 28 (twenty-eight) days.     . folic acid (V-R FOLIC ACID) 400 MCG tablet Take 1 tablet (400 mcg total) by mouth daily. 100 tablet 3  . hydrOXYzine (ATARAX/VISTARIL) 10 MG tablet Take 1 tablet (10 mg total) by mouth 3 (three) times daily as needed. (Patient taking differently: Take 10 mg by mouth 3 (three) times daily as needed for anxiety. ) 30 tablet 0  . Vitamin D, Ergocalciferol, (DRISDOL) 50000 units CAPS capsule Take 1 capsule (50,000 Units total) by mouth every 7 (seven) days. 12 capsule 0  . Cholecalciferol (VITAMIN D3) 2000 UNITS capsule Take 1 capsule (2,000 Units total) by mouth daily. 100 capsule 3   No facility-administered medications prior to visit.     PAST MEDICAL HISTORY: Past Medical History:  Diagnosis Date  . ALLERGIC RHINITIS 07/11/2009  . Hx of breast reduction, elective   . OBESITY 05/16/2007  . Papilledema of both eyes 09/2018  . Pilonidal cyst     PAST SURGICAL HISTORY: Past Surgical History:  Procedure Laterality Date  . BREAST REDUCTION SURGERY  2005  . LAPAROSCOPIC GASTRIC SLEEVE RESECTION  2016    FAMILY HISTORY: Family History  Problem Relation Age of Onset  . Hypertension Mother   . Pseudotumor cerebri Mother   . Hypertension Father     SOCIAL HISTORY: Social History   Socioeconomic History  . Marital status: Married    Spouse name: Leonette Most  . Number of children: 0  . Years of education: college  . Highest education level: Not on file  Occupational History    . Occupation: Consulting civil engineer    Comment: [employer]  Social Needs  . Financial resource strain: Not on file  . Food insecurity:    Worry: Not on file    Inability: Not on file  . Transportation needs:    Medical: Not on file    Non-medical: Not on file  Tobacco Use  . Smoking status: Never Clayton  . Smokeless tobacco: Never Used  Substance and Sexual Activity  . Alcohol use: Yes    Comment: socially  . Drug use: Never  . Sexual activity: Not on file  Lifestyle  . Physical activity:    Days per week: Not on file    Minutes per session: Not on file  . Stress: Not on file  Relationships  . Social connections:    Talks on phone: Not on file    Gets together: Not on file    Attends religious service: Not on file    Active member of club or organization: Not on file    Attends meetings of clubs or organizations: Not on file    Relationship status: Not on file  . Intimate partner violence:    Fear of current or ex partner: Not on file    Emotionally abused: Not on file    Physically abused: Not on file    Forced sexual activity: Not on file  Other Topics Concern  . Not on file  Social History Narrative   Lives with husband   Caffeine- 2 teas daily     PHYSICAL EXAM  GENERAL EXAM/CONSTITUTIONAL: Vitals:  Vitals:   09/23/18 1319  BP: (!) 144/92  Pulse: 89  Weight: 232 lb (105.2 kg)  Height: 5\' 6"  (1.676 m)     Body mass index is 37.45 kg/m. Wt Readings from Last 3 Encounters:  09/23/18 232 lb (105.2 kg)  09/16/18 236 lb (107 kg)  09/09/18 236 lb (107 kg)     Patient is in no distress; well developed, nourished and groomed; neck is supple  CARDIOVASCULAR:  Examination of carotid arteries is normal; no carotid bruits  Regular rate and rhythm, no murmurs  Examination of peripheral vascular system by observation and palpation is normal  EYES:  Ophthalmoscopic exam of optic discs and posterior segments is normal; no papilledema or hemorrhages   Visual Acuity Screening   Right eye Left eye Both eyes  Without correction: 20/30 20/20   With correction:        MUSCULOSKELETAL:  Gait, strength, tone, movements noted in Neurologic exam below  NEUROLOGIC: MENTAL STATUS:  No flowsheet data found.  awake, alert, oriented to person, place and time  recent and remote memory intact  normal attention and concentration  language fluent, comprehension intact, naming intact  fund of knowledge appropriate  CRANIAL NERVE:   2nd - no papilledema on fundoscopic exam  2nd, 3rd, 4th, 6th - pupils equal and reactive to light, visual fields full to confrontation, extraocular muscles intact, no nystagmus  5th - facial sensation symmetric  7th - facial strength symmetric  8th - hearing intact  9th - palate elevates symmetrically, uvula midline  11th - shoulder shrug symmetric  12th - tongue protrusion midline  MOTOR:   normal bulk and tone, full strength in the BUE, BLE  SENSORY:   normal and symmetric to light touch, temperature, vibration  COORDINATION:   finger-nose-finger, fine finger movements normal  REFLEXES:   deep tendon reflexes present and symmetric  GAIT/STATION:   narrow based gait; romberg is negative     DIAGNOSTIC DATA (LABS, IMAGING, TESTING) - I reviewed patient records, labs, notes, testing and imaging myself where available.  Lab Results  Component Value Date   WBC 9.1 09/16/2018   HGB 11.7 (L) 09/16/2018   HCT 37.6 09/16/2018   MCV 93.5 09/16/2018   PLT 340 09/16/2018      Component Value Date/Time   NA 138 09/16/2018 1731   K 3.9 09/16/2018 1731   CL 106 09/16/2018 1731   CO2 22 09/16/2018 1731   GLUCOSE 99 09/16/2018 1731   BUN 11 09/16/2018 1731   CREATININE 1.02 (H) 09/16/2018 1731   CALCIUM 8.9 09/16/2018 1731   PROT 7.8 08/24/2013 1532   ALBUMIN 3.9 08/24/2013 1532   AST 21 08/24/2013 1532   ALT 19 08/24/2013 1532   ALKPHOS 55 08/24/2013 1532   BILITOT 0.5  08/24/2013 1532   GFRNONAA >60 09/16/2018 1731   GFRAA >60 09/16/2018 1731   No results found for: CHOL, HDL, LDLCALC, LDLDIRECT, TRIG, CHOLHDL No results found for: ZOXW9U Lab Results  Component Value Date   VITAMINB12 661 08/24/2013   Lab Results  Component Value Date   TSH 0.84 08/24/2013    09/16/18 MRI head [I reviewed images myself and agree with interpretation. -VRP] 1. Mild motion degraded examination.  No acute intracranial process. 2. Partially empty sella associated with intracranial hypertension. 3. Otherwise unremarkable MRI head with and without contrast.  09/16/18 MRV head [I reviewed images myself and agree with interpretation. -VRP] 1. No emergent large vessel occlusion. 2. Stenotic dominant RIGHT transverse sinus seen with intracranial hypertension.  09/17/18 LP - Successful lumbar puncture in fluoroscopy. Fluid sent to the lab pending orders. - Opening pressure of 25 cm water.     ASSESSMENT AND PLAN  37 y.o. year old female here with headaches, blurred vision, papilledema.  Patient has headaches with features for migraine with aura as well as headaches and tinnitus consistent with IIH.  We will proceed with treatment for idiopathic intracranial hypertension (pseudotumor cerebri) for now, monitor symptoms and eye clinic funduscopic examination.  In future may also consider adding migraine specific medication treatments.  Dx:  1. IIH (idiopathic intracranial hypertension)   2. Migraine with aura and without status migrainosus, not intractable       PLAN:  - continue acetazolamide 500mg  twice a day - monitor HA, vision, and eye clinic exam - encouraged mild gradual weight loss  Meds ordered this encounter  Medications  . acetaZOLAMIDE (DIAMOX) 250 MG tablet    Sig: Take 2 tablets (500 mg total) by mouth 2 (two) times daily.    Dispense:  120 tablet    Refill:  12   Return in about 3 months (around 12/23/2018).    Suanne Marker, MD  09/23/2018, 1:59 PM Certified in Neurology, Neurophysiology and Neuroimaging  [facility] [address] [place], [place] [address] [phone]

## 2018-09-23 NOTE — Patient Instructions (Signed)
-   continue acetazolamide 500mg  twice a day  - monitor headache, vision, and eye clinic exam

## 2018-10-07 NOTE — L&D Delivery Note (Addendum)
Delivery Note At 10:50 AM a non-viable female was delivered via Vaginal, Spontaneous (Presentation: breech ;  ).  APGAR: 0, 0; weight 4.2 oz (119 g).   Placenta status: delivered after an hour; intact, .  Cord:3vc  with the following complications:? abruption .  Cord pH:n/a I was not present for delivery of baby but was present for delivery of placenta  Anesthesia:  IV pain control Episiotomy: None Lacerations: None Est. Blood Loss (mL): 101  Mom to AICU.  Baby to [place]. Pt is deciding if wants to stay or go home Anora done  Isaiah Serge 05/17/2019, 12:56 PM

## 2018-11-30 DIAGNOSIS — H4711 Papilledema associated with increased intracranial pressure: Secondary | ICD-10-CM | POA: Insufficient documentation

## 2018-11-30 DIAGNOSIS — G932 Benign intracranial hypertension: Secondary | ICD-10-CM | POA: Insufficient documentation

## 2019-03-30 DIAGNOSIS — O09529 Supervision of elderly multigravida, unspecified trimester: Secondary | ICD-10-CM | POA: Insufficient documentation

## 2019-05-17 ENCOUNTER — Inpatient Hospital Stay (HOSPITAL_COMMUNITY): Payer: BC Managed Care – PPO

## 2019-05-17 ENCOUNTER — Other Ambulatory Visit: Payer: Self-pay

## 2019-05-17 ENCOUNTER — Inpatient Hospital Stay (HOSPITAL_COMMUNITY)
Admission: EM | Admit: 2019-05-17 | Discharge: 2019-05-18 | DRG: 779 | Disposition: A | Discharge status: Home / Self Care | Payer: BC Managed Care – PPO | Attending: Obstetrics and Gynecology | Admitting: Obstetrics and Gynecology

## 2019-05-17 ENCOUNTER — Encounter (HOSPITAL_COMMUNITY): Payer: Self-pay

## 2019-05-17 DIAGNOSIS — O021 Missed abortion: Principal | ICD-10-CM | POA: Diagnosis present

## 2019-05-17 DIAGNOSIS — O3432 Maternal care for cervical incompetence, second trimester: Secondary | ICD-10-CM

## 2019-05-17 DIAGNOSIS — Z3A16 16 weeks gestation of pregnancy: Secondary | ICD-10-CM

## 2019-05-17 DIAGNOSIS — O9989 Other specified diseases and conditions complicating pregnancy, childbirth and the puerperium: Secondary | ICD-10-CM | POA: Diagnosis not present

## 2019-05-17 DIAGNOSIS — Z20828 Contact with and (suspected) exposure to other viral communicable diseases: Secondary | ICD-10-CM | POA: Diagnosis present

## 2019-05-17 LAB — CBC
HCT: 30.5 % — ABNORMAL LOW (ref 36.0–46.0)
Hemoglobin: 10.5 g/dL — ABNORMAL LOW (ref 12.0–15.0)
MCH: 32.2 pg (ref 26.0–34.0)
MCHC: 34.4 g/dL (ref 30.0–36.0)
MCV: 93.6 fL (ref 80.0–100.0)
Platelets: 252 10*3/uL (ref 150–400)
RBC: 3.26 MIL/uL — ABNORMAL LOW (ref 3.87–5.11)
RDW: 12.4 % (ref 11.5–15.5)
WBC: 10.5 10*3/uL (ref 4.0–10.5)
nRBC: 0 % (ref 0.0–0.2)

## 2019-05-17 LAB — TYPE AND SCREEN
ABO/RH(D): B POS
Antibody Screen: NEGATIVE

## 2019-05-17 LAB — SARS CORONAVIRUS 2 BY RT PCR (HOSPITAL ORDER, PERFORMED IN ~~LOC~~ HOSPITAL LAB): SARS Coronavirus 2: NEGATIVE

## 2019-05-17 LAB — RPR: RPR Ser Ql: NONREACTIVE

## 2019-05-17 LAB — ABO/RH: ABO/RH(D): B POS

## 2019-05-17 MED ORDER — BUTORPHANOL TARTRATE 1 MG/ML IJ SOLN
1.0000 mg | INTRAMUSCULAR | Status: DC | PRN
  Administered 2019-05-17: 1 mg via INTRAVENOUS
  Filled 2019-05-17: qty 1

## 2019-05-17 MED ORDER — SODIUM CHLORIDE 0.9% FLUSH: 9.0000 mL | INTRAVENOUS | Status: DC | PRN

## 2019-05-17 MED ORDER — MISOPROSTOL 200 MCG PO TABS
400.0000 ug | ORAL_TABLET | ORAL | Status: DC | PRN
  Administered 2019-05-17: 400 ug via ORAL
  Filled 2019-05-17: qty 2

## 2019-05-17 MED ORDER — ONDANSETRON HCL 4 MG/2ML IJ SOLN: 4.0000 mg | INTRAMUSCULAR | Status: DC | PRN

## 2019-05-17 MED ORDER — ONDANSETRON HCL 4 MG/2ML IJ SOLN: 4.0000 mg | Freq: Four times a day (QID) | INTRAMUSCULAR | Status: DC | PRN

## 2019-05-17 MED ORDER — HYDROXYZINE HCL 50 MG PO TABS
50.0000 mg | ORAL_TABLET | Freq: Four times a day (QID) | ORAL | Status: DC | PRN
  Filled 2019-05-17: qty 1

## 2019-05-17 MED ORDER — HYDROMORPHONE 1 MG/ML IV SOLN: INTRAVENOUS | Status: DC

## 2019-05-17 MED ORDER — ONDANSETRON HCL 4 MG/2ML IJ SOLN
4.0000 mg | Freq: Once | INTRAMUSCULAR | Status: AC
  Administered 2019-05-17: 4 mg via INTRAVENOUS

## 2019-05-17 MED ORDER — LACTATED RINGERS IV BOLUS
1000.0000 mL | Freq: Once | INTRAVENOUS | Status: AC
  Administered 2019-05-17: 1000 mL via INTRAVENOUS

## 2019-05-17 MED ORDER — COCONUT OIL OIL: 1.0000 "application " | TOPICAL_OIL | Status: DC | PRN

## 2019-05-17 MED ORDER — IBUPROFEN 600 MG PO TABS
600.0000 mg | ORAL_TABLET | Freq: Four times a day (QID) | ORAL | Status: DC
  Administered 2019-05-17 – 2019-05-18 (×3): 600 mg via ORAL
  Filled 2019-05-17 (×3): qty 1

## 2019-05-17 MED ORDER — DIBUCAINE (PERIANAL) 1 % EX OINT: 1.0000 "application " | TOPICAL_OINTMENT | CUTANEOUS | Status: DC | PRN

## 2019-05-17 MED ORDER — BENZOCAINE-MENTHOL 20-0.5 % EX AERO: 1.0000 "application " | INHALATION_SPRAY | CUTANEOUS | Status: DC | PRN

## 2019-05-17 MED ORDER — PRENATAL MULTIVITAMIN CH: 1.0000 | ORAL_TABLET | Freq: Every day | ORAL | Status: DC

## 2019-05-17 MED ORDER — ACETAMINOPHEN 325 MG PO TABS: 650.0000 mg | ORAL_TABLET | ORAL | Status: DC | PRN

## 2019-05-17 MED ORDER — FENTANYL CITRATE (PF) 100 MCG/2ML IJ SOLN
50.0000 ug | INTRAMUSCULAR | Status: DC | PRN
  Administered 2019-05-17: 100 ug via INTRAVENOUS
  Administered 2019-05-17: 50 ug via INTRAVENOUS
  Filled 2019-05-17 (×3): qty 2

## 2019-05-17 MED ORDER — ZOLPIDEM TARTRATE 5 MG PO TABS: 5.0000 mg | ORAL_TABLET | Freq: Every evening | ORAL | Status: DC | PRN

## 2019-05-17 MED ORDER — FENTANYL CITRATE (PF) 100 MCG/2ML IJ SOLN
100.0000 ug | INTRAMUSCULAR | Status: DC | PRN
  Administered 2019-05-17 (×3): 100 ug via INTRAVENOUS
  Filled 2019-05-17 (×2): qty 2

## 2019-05-17 MED ORDER — MORPHINE SULFATE (PF) 4 MG/ML IV SOLN: 4.0000 mg | Freq: Once | INTRAVENOUS | Status: DC

## 2019-05-17 MED ORDER — FENTANYL CITRATE (PF) 100 MCG/2ML IJ SOLN
INTRAMUSCULAR | Status: AC
  Filled 2019-05-17: qty 2

## 2019-05-17 MED ORDER — SOD CITRATE-CITRIC ACID 500-334 MG/5ML PO SOLN: 30.0000 mL | ORAL | Status: DC | PRN

## 2019-05-17 MED ORDER — DIPHENHYDRAMINE HCL 12.5 MG/5ML PO ELIX
12.5000 mg | ORAL_SOLUTION | Freq: Four times a day (QID) | ORAL | Status: DC | PRN
  Filled 2019-05-17: qty 5

## 2019-05-17 MED ORDER — WITCH HAZEL-GLYCERIN EX PADS: 1.0000 "application " | MEDICATED_PAD | CUTANEOUS | Status: DC | PRN

## 2019-05-17 MED ORDER — DIPHENHYDRAMINE HCL 25 MG PO CAPS: 25.0000 mg | ORAL_CAPSULE | Freq: Four times a day (QID) | ORAL | Status: DC | PRN

## 2019-05-17 MED ORDER — NALOXONE HCL 0.4 MG/ML IJ SOLN: 0.4000 mg | INTRAMUSCULAR | Status: DC | PRN

## 2019-05-17 MED ORDER — TETANUS-DIPHTH-ACELL PERTUSSIS 5-2.5-18.5 LF-MCG/0.5 IM SUSP: 0.5000 mL | Freq: Once | INTRAMUSCULAR | Status: DC

## 2019-05-17 MED ORDER — SENNOSIDES-DOCUSATE SODIUM 8.6-50 MG PO TABS
2.0000 | ORAL_TABLET | ORAL | Status: DC
  Administered 2019-05-17: 2 via ORAL
  Filled 2019-05-17: qty 2

## 2019-05-17 MED ORDER — OXYCODONE-ACETAMINOPHEN 5-325 MG PO TABS: 2.0000 | ORAL_TABLET | ORAL | Status: DC | PRN

## 2019-05-17 MED ORDER — DIPHENHYDRAMINE HCL 50 MG/ML IJ SOLN: 12.5000 mg | Freq: Four times a day (QID) | INTRAMUSCULAR | Status: DC | PRN

## 2019-05-17 MED ORDER — ONDANSETRON HCL 4 MG PO TABS: 4.0000 mg | ORAL_TABLET | ORAL | Status: DC | PRN

## 2019-05-17 MED ORDER — ONDANSETRON HCL 4 MG/2ML IJ SOLN
INTRAMUSCULAR | Status: AC
  Filled 2019-05-17: qty 2

## 2019-05-17 MED ORDER — OXYTOCIN BOLUS FROM INFUSION
500.0000 mL | Freq: Once | INTRAVENOUS | Status: AC
  Administered 2019-05-17: 12:00:00 500 mL via INTRAVENOUS

## 2019-05-17 MED ORDER — OXYCODONE HCL 5 MG PO TABS: 5.0000 mg | ORAL_TABLET | ORAL | Status: DC | PRN

## 2019-05-17 MED ORDER — MISOPROSTOL 200 MCG PO TABS: 200.0000 ug | ORAL_TABLET | Freq: Once | ORAL | Status: DC

## 2019-05-17 MED ORDER — FENTANYL CITRATE (PF) 100 MCG/2ML IJ SOLN: 100.0000 ug | INTRAMUSCULAR | Status: DC | PRN

## 2019-05-17 MED ORDER — OXYCODONE HCL 5 MG PO TABS: 10.0000 mg | ORAL_TABLET | ORAL | Status: DC | PRN

## 2019-05-17 MED ORDER — OXYCODONE-ACETAMINOPHEN 5-325 MG PO TABS: 1.0000 | ORAL_TABLET | ORAL | Status: DC | PRN

## 2019-05-17 MED ORDER — SIMETHICONE 80 MG PO CHEW: 80.0000 mg | CHEWABLE_TABLET | ORAL | Status: DC | PRN

## 2019-05-17 MED ORDER — OXYTOCIN 40 UNITS IN NORMAL SALINE INFUSION - SIMPLE MED
2.5000 [IU]/h | INTRAVENOUS | Status: DC
  Filled 2019-05-17: qty 1000

## 2019-05-17 MED ORDER — LACTATED RINGERS IV SOLN
INTRAVENOUS | Status: DC
  Administered 2019-05-17: 12:00:00 via INTRAVENOUS

## 2019-05-17 MED ORDER — LACTATED RINGERS IV SOLN: 500.0000 mL | INTRAVENOUS | Status: DC | PRN

## 2019-05-17 MED ORDER — LIDOCAINE HCL (PF) 1 % IJ SOLN: 30.0000 mL | INTRAMUSCULAR | Status: DC | PRN

## 2019-05-17 NOTE — H&P (Signed)
Angel Clayton is a 38 y.o. female G2P0010 at 7116+ with increased risk of Trisomy 221 by Panorama and IUFD.  D/W pt IOL for IUFD including r/b/a.  [place] reveals no FHTs and no measurable cervix - awaiting final read.  D/W pt can send Anora for chromosomes (was scheduled for Amniocentesis 8/13) OB History    Gravida  2   Para  0   Term      Preterm      AB  1   Living        SAB      TAB  1   Ectopic      Multiple      Live Births            G1 TAB G2 present  No abn pap - last 4/18 HR HPV neg No STD  Past Medical History:  Diagnosis Date  . ALLERGIC RHINITIS 07/11/2009  . Hx of breast reduction, elective   . OBESITY 05/16/2007  . Papilledema of both eyes 09/2018  . Pilonidal cyst    Past Surgical History:  Procedure Laterality Date  . BREAST REDUCTION SURGERY  2005  . LAPAROSCOPIC GASTRIC SLEEVE RESECTION  2016   Family History: family history includes Hypertension in her father and mother; Pseudotumor cerebri in her mother. Social History:  reports that she has never smoked. She has never used smokeless tobacco. She reports previous alcohol use. She reports that she does not use drugs.married, Merchandiser, retailsupervisor of print services  Meds PNV, B6, Unisom All NKDA     Maternal Diabetes: No Genetic Screening: Abnormal:  Results: Elevated risk of Trisomy 21 Maternal Ultrasounds/Referrals: Normal Fetal Ultrasounds or other Referrals:  None Maternal Substance Abuse:  No Significant Maternal Medications:  None Significant Maternal Lab Results:  None Other Comments:  None  Review of Systems  Constitutional: Negative.   HENT: Negative.   Eyes: Negative.   Respiratory: Negative.   Cardiovascular: Negative.   Gastrointestinal: Negative.   Genitourinary: Negative.   Musculoskeletal: Negative.   Skin: Negative.   Neurological: Negative.   Psychiatric/Behavioral: Negative.    Maternal Medical History:  Prenatal complications: Panorama high risk 90% chance  Tri21  Prenatal Complications - Diabetes: none.      Blood pressure (!) 150/88, pulse 90, resp. rate 19, last menstrual period 09/10/2018. Maternal Exam:  Uterine Assessment: Contraction frequency is irregular.   Abdomen: Patient reports the following abdominal tenderness: RLQ.  Fundal height is appropriate for gestation.   Fetal presentation: breech  Introitus: Normal vulva. Normal vagina.    Physical Exam  Constitutional: She is oriented to person, place, and time. She appears well-developed and well-nourished.  obese  HENT:  Head: Normocephalic and atraumatic.  Cardiovascular: Normal rate and regular rhythm.  Respiratory: Effort normal and breath sounds normal.  GI: Soft. Bowel sounds are normal. She exhibits no distension. There is abdominal tenderness in the right lower quadrant.  Genitourinary:    Vulva normal.   Musculoskeletal: Normal range of motion.  Neurological: She is alert and oriented to person, place, and time.  Skin: Skin is warm and dry.  Psychiatric: She has a normal mood and affect. Her behavior is normal.    Prenatal labs: ABO, Rh:  B+ Antibody:  neg Rubella:  immune RPR:   NR HBsAg:   NR HIV:   neg GBS:   unknown  Dated by LMP  Panorama hign risk 90% - Tri 21  Hgb 10.5/Plt 286/Ur Cx neg/GC neg/Chl neg/Varicella immune/CF neg/Hgb electro WNL  Assessment/Plan: 38yo G2P0010 at 16+ w IUFD (inc risk Tri21) Cytotec for IOL Pain control - PCA vs epidural - depends where gets admitted Anora for chromosomes.     Angel Clayton 05/17/2019, 6:22 AM

## 2019-05-17 NOTE — Progress Notes (Signed)
Spent time with Angel Clayton and Angel Clayton throughout the day today as they awaited the delivery of baby Chloe.  Angel Clayton shared that over the course of the last week, they learned that their might be some complications with their baby and then today learned that her heart had stopped.  Angel Clayton shared the journey of their pregnancy and hopes for the baby's delivery.  I offered emotional support and space to process their feelings as they prepared for delivery.  After delivery, Angel Clayton shared that she is feeling much better physically and feels a sense of "peace that passes all understanding."  She plans to have the hospital dispose of Chloe's remains and says that they feel like they got the time they needed with their little one-they recognized features in her that they both carry and got some photos and foot prints.  They plan to allow their family to visit once they get home and are thinking they may stay over night to be able to rest a bit before facing everyone.  We began discussing ways to share the news with others, managing expectations and boundaries, and communicating needs with people who want to help.  I will continue to follow and they are aware that spiritual care is available to them after discharge as well.  Please page as further needs arise.  Donald Prose. Elyn Peers, M.Div. [facility] Chaplain Pager [phone] Office [phone]

## 2019-05-17 NOTE — Progress Notes (Signed)
Awaiting placenta 

## 2019-05-17 NOTE — Progress Notes (Signed)
Placenta not delivered.  Dr Terri Piedra at bedside. Pitocin started

## 2019-05-17 NOTE — MAU Note (Signed)
Started having intermittent abdominal pain around 2100.  No VB but feels a lot of pressure as if something is going to come out.  Also reports a thin white, milky discharge.

## 2019-05-17 NOTE — Progress Notes (Signed)
Had discussions with patient regarding seeing/holding infant, disposition of the body, ANora testing, and spiritual care.

## 2019-05-17 NOTE — Progress Notes (Signed)
Patient ID: Angel Clayton, female   DOB: [birth year], 38 y.o.   MRN: [id number] Pt beginning to appreciate some discomfort with contractions. No LOF or bleeding. Not ready for pain medication at this time VSS SVE - bulging bag; high station  A/P: 38yo G2P0010 with 16 week IUFD s/p cytotec x 1         AROM performed with copious clear fluid noted         Pain control per pt request         Discussed some considerations eg holding baby, cremation/funeral home vs hospital etc.

## 2019-05-17 NOTE — MAU Provider Note (Addendum)
History     CSN: [id number]  Arrival date and time: 05/17/19 2458   First Provider Initiated Contact with Patient 05/17/19 0413      Chief Complaint  Patient presents with  . Abdominal Pain   Angel Clayton is a 38 y.o. G2P0010 at [redacted]w[redacted]d who receives care at [facility].  She presents today for Abdominal Pain.  Patient states she started having abdominal pain at 0130 that has been increasingly more intense throughout the night.  Patient reports the pain is on the right side, is sharp, and intermittent but lasts "a while" when it occurs.  She states that while on the toilet she felt like she had something in her vagina and upon checking felt something "that felt like the outside of a hard boiled egg."  She states that now when she has the pain she feels the urge to push.  Patient rates the pain a 8/10 and states the pain is also in her back.  Patient states she has taken tylenol pm, but primarily for back pain.      OB History    Gravida  2   Para  0   Term      Preterm      AB  1   Living        SAB      TAB  1   Ectopic      Multiple      Live Births              Past Medical History:  Diagnosis Date  . ALLERGIC RHINITIS 07/11/2009  . Hx of breast reduction, elective   . OBESITY 05/16/2007  . Papilledema of both eyes 09/2018  . Pilonidal cyst     Past Surgical History:  Procedure Laterality Date  . BREAST REDUCTION SURGERY  2005  . LAPAROSCOPIC GASTRIC SLEEVE RESECTION  2016    Family History  Problem Relation Age of Onset  . Hypertension Mother   . Pseudotumor cerebri Mother   . Hypertension Father     Social History   Tobacco Use  . Smoking status: Never Smoker  . Smokeless tobacco: Never Used  Substance Use Topics  . Alcohol use: Not Currently  . Drug use: Never    Allergies: No Known Allergies  Medications Prior to Admission  Medication Sig Dispense Refill Last Dose  . acetaminophen (TYLENOL) 500 MG tablet Take 1,000 mg by mouth  every 6 (six) hours as needed for mild pain.    05/16/2019 at Unknown time  . doxylamine, Sleep, (UNISOM) 25 MG tablet Take 25 mg by mouth at bedtime as needed for sleep.   05/16/2019 at Unknown time  . acetaZOLAMIDE (DIAMOX) 250 MG tablet Take 2 tablets (500 mg total) by mouth 2 (two) times daily. 120 tablet 12   . Diclofenac Sodium 2 % SOLN Place 2 g onto the skin 2 (two) times daily. 112 g 3   . etonogestrel-ethinyl estradiol (NUVARING) 0.12-0.015 MG/24HR vaginal ring Place 1 each vaginally every 28 (twenty-eight) days.      . folic acid (V-R FOLIC ACID) 099 MCG tablet Take 1 tablet (400 mcg total) by mouth daily. 100 tablet 3   . hydrOXYzine (ATARAX/VISTARIL) 10 MG tablet Take 1 tablet (10 mg total) by mouth 3 (three) times daily as needed. (Patient taking differently: Take 10 mg by mouth 3 (three) times daily as needed for anxiety. ) 30 tablet 0   . Vitamin D, Ergocalciferol, (DRISDOL) 50000 units CAPS capsule  Take 1 capsule (50,000 Units total) by mouth every 7 (seven) days. 12 capsule 0     Review of Systems  Gastrointestinal: Positive for abdominal pain and constipation. Negative for diarrhea, nausea and vomiting.  Genitourinary: Positive for vaginal discharge.   Physical Exam   Blood pressure (!) 150/88, pulse 90, resp. rate 19, last menstrual period 09/10/2018.  Physical Exam  Constitutional: She is oriented to person, place, and time. She appears well-developed and well-nourished.  HENT:  Head: Normocephalic and atraumatic.  Eyes: Conjunctivae are normal.  Neck: Normal range of motion.  Cardiovascular: Normal rate.  Respiratory: Effort normal.  Genitourinary:    Vaginal discharge present.     No vaginal bleeding.  No bleeding in the vagina.    Genitourinary Comments: Sterile Speculum Exam: -Normal External Genitalia: Non tender, Apparent milky white discharge at introitus.  -Vaginal Vault: Pink mucosa with good rugae. Membranes noted in vagina.  -Cervix: Unable to visualize due  to membranes in vault. -Bimanual Exam: Membranes palpated.  Unable to evaluate cervix.    Musculoskeletal: Normal range of motion.  Neurological: She is alert and oriented to person, place, and time.  Skin: Skin is warm and dry.  Psychiatric: She has a normal mood and affect. Her behavior is normal.    MAU Course  Procedures  MDM Pelvic Exam Start IV Give Pain Medication  Assessment and Plan  38 year old, G2P0010  SIUP at 16.2weeks Prolapsed Membranes   -Exam findings discussed. -Discussed how if membranes rupture, infant lung development is immature and not compatible with life.  -Informed that primary ob will be contacted and updated on patient status.  -Patient and husband without questions or concerns.  -Dr. Austin MilesM. Ervin consulted and confirms that provider should contact primary ob for further management. -Dr. Carmelina NounJ. Bovard in surgery currently, but states will be in contact  provider once available.  -Patient placed in Trendelenburg's -Will start IV and give pain medication. -Nurse instructed to collect and hold admit labs.   [name] MSN, CNM 05/17/2019, 4:13 AM   Reassessment (5:03 AM)  -Dr. Ellyn HackBovard returns call and states will be down to assess. Agrees with plan thus far.  Requests BSUS to determine amount of prolapse and cervical length.   -Further requests provider go to bedside and assess patient's desire for management.  -Patient updated on POC. -Discussion regarding management options including expectant/observation with bed rest, possible cerclage placement if membranes hourglass back into uterus, or pregnancy termination. -Patient questions and concerns addressed including risks to fetus and personal health.  Discussed possibility of loss, inability to rescue if SROM occurs, and infection. -Patient unsure and reassured that no decisions have to be made in this moment.  Encouraged to discuss with SO. -No further questions or concerns.  -Nurse reports unable to  doppler FHTs upon arrival.  -Will order limited BS OB US ordered  Reassessment (5:42 AM) IUFD  -Preliminary report confirms no measurable cervix, No FHR, breech presentation. -Dr. Ellyn HackBovard to be notified.  Reassessment (6:18 AM) Dr. Ellyn HackBovard on unit and assumes care.  Cherre RobinsJessica L Sabastian Raimondi MSN, CNM 05/17/2019

## 2019-05-18 LAB — CBC
HCT: 29 % — ABNORMAL LOW (ref 36.0–46.0)
Hemoglobin: 9.7 g/dL — ABNORMAL LOW (ref 12.0–15.0)
MCH: 32 pg (ref 26.0–34.0)
MCHC: 33.4 g/dL (ref 30.0–36.0)
MCV: 95.7 fL (ref 80.0–100.0)
Platelets: 257 10*3/uL (ref 150–400)
RBC: 3.03 MIL/uL — ABNORMAL LOW (ref 3.87–5.11)
RDW: 12.7 % (ref 11.5–15.5)
WBC: 11.5 10*3/uL — ABNORMAL HIGH (ref 4.0–10.5)
nRBC: 0 % (ref 0.0–0.2)

## 2019-05-18 MED ORDER — HYDROXYZINE PAMOATE 25 MG PO CAPS: 25.0000 mg | ORAL_CAPSULE | Freq: Every evening | ORAL | 0 refills | Status: DC | PRN

## 2019-05-18 MED ORDER — IBUPROFEN 600 MG PO TABS: 600.0000 mg | ORAL_TABLET | Freq: Four times a day (QID) | ORAL | 0 refills | Status: DC

## 2019-05-18 NOTE — Progress Notes (Signed)
Patient ID: Angel Clayton, female   DOB: [birth year], 38 y.o.   MRN: [id number]  S/p 16 week demise and delivery  Pt reports minimal bleeding, no pain.  Ready to go home  afeb vss Fundus firm  D/w pt and husband normal grieving and to call if feel things are not going well.  They have a good support network at home. Will have f/u in 2-3 weeks for f/u.

## 2019-05-18 NOTE — Discharge Summary (Signed)
Physician Discharge Summary  Patient ID: Angel Clayton MRN: [id number] DOB/AGE: 1980/10/22 38 y.o.  Admit date: 05/17/2019 Discharge date: 05/18/2019  Admission Diagnoses: 16 week fetal demise Probable chromosomal abnormality --anora pending S/P vaginal delivery  Discharge Diagnoses:  Active Problems:   IUFD at less than 20 weeks of gestation   Discharged Condition: good  Hospital Course: Pt was admitted with abdominal pain and found to have membranes in vagina and baby had no heartbeat c/w fetal demise.  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  She had a cytotec induction and delivered both baby and placenta.  On the day after delivery, she had minimal bleeding and was ready to go home.  We discussed normal grieving and coping mechanisms. She requested something for sleep and was prescribed hydroxyzine.   Consults: None  Significant Diagnostic Studies: [place]  Treatments: cytotec induction  Discharge Exam: Blood pressure 132/70, pulse 82, temperature 98.7 F (37.1 C), temperature source Oral, resp. rate 18, height 5\' 6"  (1.676 m), weight 111.1 kg, last menstrual period 09/10/2018, SpO2 98 %. General appearance: alert and cooperative GI: soft NT, fundus firm  Disposition: Discharge disposition: 01-Home or Self Care       Discharge Instructions    Diet - low sodium heart healthy   Complete by: As directed    Discharge instructions   Complete by: As directed    Nothing in vagina for 3 weeks.  No sex, tampons or douching.  May shower or take a bath with no bubblebath or soap in water.   Call with persistent heavy bleeding (a few clots is ok) or fever.     Allergies as of 05/18/2019   No Known Allergies     Medication List    STOP taking these medications   doxylamine (Sleep) 25 MG tablet Commonly known as: UNISOM   folic acid 419 MCG tablet Commonly known as: V-R FOLIC ACID   hydrOXYzine 10 MG tablet Commonly known as:  ATARAX/VISTARIL   NuvaRing 0.12-0.015 MG/24HR vaginal ring Generic drug: etonogestrel-ethinyl estradiol     TAKE these medications   acetaminophen 500 MG tablet Commonly known as: TYLENOL Take 1,000 mg by mouth every 6 (six) hours as needed for mild pain.   acetaZOLAMIDE 250 MG tablet Commonly known as: DIAMOX Take 2 tablets (500 mg total) by mouth 2 (two) times daily.   Diclofenac Sodium 2 % Soln Place 2 g onto the skin 2 (two) times daily.   hydrOXYzine 25 MG capsule Commonly known as: Vistaril Take 1 capsule (25 mg total) by mouth at bedtime and may repeat dose one time if needed.   ibuprofen 600 MG tablet Commonly known as: ADVIL Take 1 tablet (600 mg total) by mouth every 6 (six) hours.   Vitamin D (Ergocalciferol) 1.25 MG (50000 UT) Caps capsule Commonly known as: DRISDOL Take 1 capsule (50,000 Units total) by mouth every 7 (seven) days.      Follow-up Information    Sherlyn Hay, DO. Schedule an appointment as soon as possible for a visit in 3 week(s).   Specialty: Obstetrics and Gynecology Why: followup from delivery Contact information: [place] [address] [phone]           Signed: Logan Bores 05/18/2019, 9:24 AM

## 2019-05-18 NOTE — Progress Notes (Signed)
Discharge instructions given to patient. Discussed medication changes, follow up appointment and postpartum care. Emotional support given.

## 2019-06-16 ENCOUNTER — Ambulatory Visit (HOSPITAL_COMMUNITY): Payer: BC Managed Care – PPO

## 2019-06-17 ENCOUNTER — Ambulatory Visit (HOSPITAL_COMMUNITY): Payer: Self-pay | Admitting: Genetic Counselor

## 2019-06-17 ENCOUNTER — Other Ambulatory Visit: Payer: Self-pay

## 2019-06-17 ENCOUNTER — Ambulatory Visit (HOSPITAL_COMMUNITY): Payer: BC Managed Care – PPO | Attending: Obstetrics and Gynecology | Admitting: Genetic Counselor

## 2019-06-17 DIAGNOSIS — O3510X0 Maternal care for (suspected) chromosomal abnormality in fetus, unspecified, not applicable or unspecified: Secondary | ICD-10-CM

## 2019-06-17 DIAGNOSIS — Z315 Encounter for genetic counseling: Secondary | ICD-10-CM | POA: Diagnosis not present

## 2019-06-17 DIAGNOSIS — Z3169 Encounter for other general counseling and advice on procreation: Secondary | ICD-10-CM | POA: Diagnosis not present

## 2019-06-17 DIAGNOSIS — Z3A16 16 weeks gestation of pregnancy: Secondary | ICD-10-CM

## 2019-06-17 DIAGNOSIS — O351XX0 Maternal care for (suspected) chromosomal abnormality in fetus, not applicable or unspecified: Secondary | ICD-10-CM

## 2019-06-17 NOTE — Progress Notes (Signed)
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 Angel Clayton came to her appointment alone due to COVID-19 visitor restrictions.   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 female. She had a pregnancy that had completed [redacted]w[redacted]d before a fetal demise was noted on 8/10. Per records, she underwent induction of labor at that time.  Angel Clayton denied exposure to environmental toxins or chemical agents. She denied the use of alcohol, tobacco or street drugs. She denied significant viral illnesses, fevers, and bleeding during the course of her pregnancy. Her medical and surgical histories were noncontributory.  Family History  A three generation pedigree was drafted and reviewed. The family history is remarkable for the following:  - Angel Clayton's brother and her brother's daughter both have hypercalcemia. Hypercalcemia is a condition in which the calcium level in an individual's blood is above normal. An excess of calcium can weaken bones, create kidney stones, and cause complications in the heart and brain. Hypercalcemia is usually the result of overactive parathyroid glands. However, there is a rare genetic disorder called familial hypocalciuric hypercalcemia ([place]) that causes hypercalcemia due to faulty calcium receptors in the body. If [place] is the cause of hypercalcemia in Angel Clayton's family, her brother and her niece have a 1 in 2 (50%) chance of passing it on to future children. Given that Angel Clayton does not have hypercalcemia herself, risk of recurrence in her children is unlikely.  - Angel Clayton had a paternal aunt with mild intellectual disability and short stature. This aunt has passed away, but Angel Clayton was not sure of her cause of death. Angel Clayton  reported that another paternal aunt was the primary caretaker for her aunt who needed assistance with activities of daily living. She is not sure of the etiology of her aunt's condition. Thus, risk assessment was limited.  - Angel Clayton's partner, Angel Clayton, has a sister with sickle cell trait. Angel Clayton's sister's daughter also has sickle cell trait. Individuals with sickle cell trait are carriers of a condition called sickle cell disease, AKA sickle cell anemia (SCA). SCA is inherited in an autosomal recessive pattern in which both parents must be carriers of the condition to be at risk of having an affected child. Given that Angel Clayton's sister is known to carry sickle cell trait, he has a 50% chance of also being a carrier for SCA. However, Angel Clayton had a normal hemoglobin electrophoresis that did not identify her as being a carrier for SCA. This significantly decreases the risk of the couple having a child affected by SCA.  The remaining family histories were reviewed and found to be noncontributory for birth defects, intellectual disability, recurrent pregnancy loss, and known genetic conditions.    The patient's ethnicity is [place] and [place]. The father of the pregnancy's ethnicity is African American. Ashkenazi Jewish ancestry and consanguinity were denied. Pedigree will be scanned under Media.  Discussion  Angel Clayton was referred for genetic counseling as she had a miscarriage in August with abnormal Anora testing results. 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  Down syndrome is one of the most common extra chromosome conditions, as approximately 1 in 800 babies are born with this condition. Down syndrome is characterized by a  characteristic facial appearance, mild to moderate mental retardation, and an increased chance for a heart defect. Approximately half of babies with Down syndrome are born with a heart defect that may require surgery after birth.  Children with Down syndrome also have an increased chance for thyroid problems, which can range from an underactive to an overactive thyroid. All newborns in [place] are screened for thyroid function at birth. Also, low muscle tone, vision problems, and respiratory and ear infections are more common among babies with Down syndrome. We discussed that there are additional features associated with Down syndrome. However, prenatally we cannot accurately predict all features that would be present in an individual with Down syndrome.   We discussed that there are different types of Down syndrome caused by different chromosomal arrangements. Approximately 95% of cases of Down syndrome are caused by an entire extra copy of chromosome 21. This form of Down syndrome is non-familial, generally occurring randomly dueto an error in chromosomal division during the formation of sperm and egg cells in a process called nondisjunction. Approximately 2-4% of cases of Down syndrome are caused by a translocation involving chromosome 21. Down syndrome due to translocations can be inherited. If a parent carries a balanced translocation, it can become unbalanced when passing it on to a child. If a child inherits an unbalanced translocation in which they have two normal copies of chromosome 21 plus extra genetic material from chromosome 21 attached to another chromosome, they will have three copies of genetic material from chromosome 21 and have features of Down syndrome.   It is known that egg cells are more prone to nondisjunction as a woman ages. Since Angel Clayton was considered to be of advanced maternal age at the time of her pregnancy, it is likely that Down syndrome occurred due to chance. However, without having a karyotype for the previous affected pregnancy, Down syndrome due to a translocation cannot be ruled out.  We reviewed thatthe risk of having a child with a chromosome condition increases after having a previous  pregnancy affected by a chromosomal aneuploidy such as Down syndrome, even if Down syndrome occurred by chance in the previous pregnancy. Since Angel Clayton was over the age of 21 during her previous affected pregnancy, and since she will be over the age of 11 at the time of delivery for future pregnancies, her risk of having another pregnancy affected by Down syndrome increases 1.6-fold. Based on her age, Angel Clayton has a 1 in 176 (~0.6%) chance of a future pregnancy having Down syndrome. Her adjusted risk thus increases to approximately 0.9%. Ms. [place] also has a 1.7-fold increased risk to have a pregnancy affected by a chromosomal aneuploidy other than Down syndrome. Thus, Ms. [place] has a 2% chance of having a future pregnancy affected by a different chromosomal aneuploidy, such as trisomy 12 or trisomy 59.  We reviewed that there are several options to determine whether or not a pregnancy is affected by a chromosomal aneuploidy, either prior to conception or during pregnancy. Firstly, preimplantation genetic testing for aneuploidies (PGT-A) is possible for future pregnancies. PGT-A utilizes in vitro fertilization, with genetic testing for chromosomal aneuploidiesperformed prior to embryoimplantation. Embryos that are unaffected are then transferred to the uterus for implantation. We discussed that this technology is relatively new, and thus the process can be costly.Insurance oftentimes does not cover the entire cost of the procedure.  We also discussed options for aneuploidy screening and diagnostic testing in future pregnancies. Firstly, approximately half (50%) of fetuses  with Down syndrome and 90% of fetuses with a chromosomal aneuploidy such as trisomy 7613 or trisomy 4618 show signs of the condition on ultrasound. Secondly, noninvasive prenatal screening (NIPS) platforms are able to detect pregnancies affected by Down syndrome and other chromosomal aneuploidies with 94-99% sensitivity. We reviewed that this  test is not diagnostic for chromosome conditions, but can provide information regarding the presence or absence of extra fetal DNA. Finally, we discussed the option of diagnostic testing viachorionic villus sampling (CVS) oramniocentesis. We discussed the technical aspects of each procedure and quoted up to a 1 in 500 (0.2%) risk for spontaneous pregnancy loss or other adverse pregnancy outcomes as a result of either procedure. Cultured cells from either a placental or amniotic fluid sample allow for the visualization of a fetal karyotype, which can detect >99% of chromosomal aberrations.   Angel Clayton was interested in learning if there were any preconception blood testing options that could assess the risk for her future pregnancies to be affected by Down syndrome. We discussed that it is possible to perform parental karyotypes to determine if she or her partner carries a balanced translocation that could be passed on to future children to attempt to rule out Down syndrome due to a translocation. However, it is unlikely that insurance would cover the cost of testing given that a translocation was not determined to be the etiology of Down syndrome in the previous pregnancy. Additionally, normal karyotypes would not eliminate the risk of Down syndrome in future pregnancies given Angel Clayton's age-related and history-related risks. Angel Clayton understood the limitations of parental karyotypes and declined undergoing testing at this time.   Angel Clayton had several thoughtful questions that were answered during the session. Her feelings of loss were validated and she was reassured that there was nothing she did or did not do to cause this. She is eager to begin trying for another pregnancy, as she believes that will assist her in her healing process. She reported feeling well supported by her husband and her church community. However, she expressed interest in connecting with other women who have experienced pregnancy  loss. I provided Angel Clayton with information about the [employer] [facility] organization. [employer] offers in-person and virtual support groups, resource packets, private online communities, and memorial events for families who have experienced pregnancy loss. Angel Clayton was grateful for this resource. Angel Clayton was also provided with my business card should she or her partner think of any additional questions, or if they want to discuss testing options in future pregnancies.   I counseled Angel Clayton regarding the above risks and available options. The approximate face-to-face time with the genetic counselor was 45 minutes.  In summary:  Discussed Anora testing result ? 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 In vitro fertilization with preimplantation genetic testing vs. natural conception ? Screen for chromosomal aneuploidies via ultrasounds or NIPS ? Diagnostic testing via CVS/amniocentesis  Reviewed family history concerns   Angel CraneHaley E Bryanna Yim, MS Genetic Counselor

## 2019-07-20 ENCOUNTER — Ambulatory Visit: Payer: BC Managed Care – PPO | Admitting: Registered"

## 2019-08-05 ENCOUNTER — Ambulatory Visit: Payer: BC Managed Care – PPO | Admitting: Registered"

## 2019-08-16 ENCOUNTER — Other Ambulatory Visit: Payer: Self-pay

## 2019-08-16 ENCOUNTER — Encounter (HOSPITAL_COMMUNITY): Payer: Self-pay

## 2019-08-16 ENCOUNTER — Ambulatory Visit (HOSPITAL_COMMUNITY)
Admission: EM | Admit: 2019-08-16 | Discharge: 2019-08-16 | Disposition: A | Discharge status: Home / Self Care | Payer: BC Managed Care – PPO | Attending: Family Medicine | Admitting: Family Medicine

## 2019-08-16 DIAGNOSIS — Z20828 Contact with and (suspected) exposure to other viral communicable diseases: Secondary | ICD-10-CM | POA: Insufficient documentation

## 2019-08-16 DIAGNOSIS — G8929 Other chronic pain: Secondary | ICD-10-CM | POA: Insufficient documentation

## 2019-08-16 DIAGNOSIS — E559 Vitamin D deficiency, unspecified: Secondary | ICD-10-CM | POA: Diagnosis not present

## 2019-08-16 DIAGNOSIS — Z79899 Other long term (current) drug therapy: Secondary | ICD-10-CM | POA: Diagnosis not present

## 2019-08-16 DIAGNOSIS — J029 Acute pharyngitis, unspecified: Secondary | ICD-10-CM | POA: Diagnosis not present

## 2019-08-16 DIAGNOSIS — Z791 Long term (current) use of non-steroidal anti-inflammatories (NSAID): Secondary | ICD-10-CM | POA: Insufficient documentation

## 2019-08-16 DIAGNOSIS — E669 Obesity, unspecified: Secondary | ICD-10-CM | POA: Diagnosis not present

## 2019-08-16 LAB — POCT RAPID STREP A: Streptococcus, Group A Screen (Direct): NEGATIVE

## 2019-08-16 NOTE — ED Triage Notes (Signed)
Pt presents with sore throat and bilateral ear pain since Friday.

## 2019-08-16 NOTE — ED Provider Notes (Signed)
[facility]    CSN: [id number] Arrival date & time: 08/16/19  1407      History   Chief Complaint Chief Complaint  Patient presents with  . Appointment  . (2:10) Ear Pain & Sore Throat    HPI Angel Clayton is a 38 y.o. female.   Patient is a 38 year old female presents today with bilateral ear pressure, sore throat for the past 3 days.  Symptoms been constant, waxing waning.  She has been taking ibuprofen with some relief.  No associated fever, chills, cough, congestion.  Does have a history of allergies.  Currently not taking any allergy medicine. COVID exposire.   ROS per HPI    Sore Throat This is a new problem. The current episode started more than 2 days ago. The problem occurs constantly. The problem has not changed since onset.Pertinent negatives include no chest pain, no headaches and no shortness of breath.    Past Medical History:  Diagnosis Date  . ALLERGIC RHINITIS 07/11/2009  . Hx of breast reduction, elective   . OBESITY 05/16/2007  . Papilledema of both eyes 09/2018  . Pilonidal cyst     Patient Active Problem List   Diagnosis Date Noted  . IUFD at less than 20 weeks of gestation 05/17/2019  . Sore throat 09/09/2018  . Chronic right shoulder pain 06/29/2018  . Acute upper respiratory infection 03/05/2016  . Eustachian tube dysfunction 03/05/2016  . 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 Hypocalcemia 06/02/2015  . Anemia 06/02/2015  . Vitamin D deficiency 06/02/2015  . Preop exam for internal medicine 06/02/2015  . Insomnia 08/24/2013  . Pilonidal cyst with abscess 06/07/2011  . ALLERGIC RHINITIS 07/11/2009  . Obesity 05/16/2007    Past Surgical History:  Procedure Laterality Date  . BREAST REDUCTION SURGERY  2005  . LAPAROSCOPIC GASTRIC SLEEVE RESECTION  2016    OB History    Gravida  2   Para  1   Term      Preterm      AB  1   Living        SAB      TAB  1   Ectopic      Multiple  0   Live Births                Home Medications    Prior to Admission medications   Medication Sig Start Date End Date Taking? Authorizing Provider  acetaminophen (TYLENOL) 500 MG tablet Take 1,000 mg by mouth every 6 (six) hours as needed for mild pain.     [provider]  acetaZOLAMIDE (DIAMOX) 250 MG tablet Take 2 tablets (500 mg total) by mouth 2 (two) times daily. 09/23/18   Penumalli, Glenford BayleyVikram R, MD  Diclofenac Sodium 2 % SOLN Place 2 g onto the skin 2 (two) times daily. 07/15/18   Judi SaaSmith, Zachary M, DO  hydrOXYzine (VISTARIL) 25 MG capsule Take 1 capsule (25 mg total) by mouth at bedtime and may repeat dose one time if needed. 05/18/19   Huel Coteichardson, Kathy, MD  ibuprofen (ADVIL) 600 MG tablet Take 1 tablet (600 mg total) by mouth every 6 (six) hours. 05/18/19   Huel Coteichardson, Kathy, MD  Vitamin D, Ergocalciferol, (DRISDOL) 50000 units CAPS capsule Take 1 capsule (50,000 Units total) by mouth every 7 (seven) days. 07/15/18   Judi SaaSmith, Zachary M, DO    Family History Family History  Problem Relation Age of Onset  . Hypertension Mother   . Pseudotumor cerebri Mother   .  Hypertension Father     Social History Social History   Tobacco Use  . Smoking status: Never Smoker  . Smokeless tobacco: Never Used  Substance Use Topics  . Alcohol use: Not Currently  . Drug use: Never     Allergies   Patient has no known allergies.   Review of Systems Review of Systems  Respiratory: Negative for shortness of breath.   Cardiovascular: Negative for chest pain.  Neurological: Negative for headaches.     Physical Exam Triage Vital Signs ED Triage Vitals [08/16/19 1507]  Enc Vitals Group     BP (!) 143/99     Pulse Rate 89     Resp 17     Temp 98.3 F (36.8 C)     Temp Source Oral     SpO2 100 %     Weight      Height      Head Circumference      Peak Flow      Pain Score 6     Pain Loc      Pain Edu?      Excl. in GC?    No data found.  Updated Vital Signs BP (!) 143/99 (BP Location: Right  Arm)   Pulse 89   Temp 98.3 F (36.8 C) (Oral)   Resp 17   LMP 08/12/2019   SpO2 100%   Visual Acuity Right Eye Distance:   Left Eye Distance:   Bilateral Distance:    Right Eye Near:   Left Eye Near:    Bilateral Near:     Physical Exam Vitals signs and nursing note reviewed.  Constitutional:      General: She is not in acute distress.    Appearance: She is well-developed. She is not ill-appearing, toxic-appearing or diaphoretic.  HENT:     Head: Normocephalic and atraumatic.     Right Ear: Tympanic membrane and ear canal normal.     Left Ear: Tympanic membrane and ear canal normal.     Nose: Congestion present.     Mouth/Throat:     Pharynx: Posterior oropharyngeal erythema present.  Eyes:     Conjunctiva/sclera: Conjunctivae normal.  Neck:     Musculoskeletal: Normal range of motion and neck supple.  Cardiovascular:     Rate and Rhythm: Normal rate and regular rhythm.     Heart sounds: No murmur.  Pulmonary:     Effort: Pulmonary effort is normal. No respiratory distress.     Breath sounds: Normal breath sounds.  Musculoskeletal: Normal range of motion.  Lymphadenopathy:     Cervical: No cervical adenopathy.  Skin:    General: Skin is warm and dry.  Neurological:     Mental Status: She is alert.  Psychiatric:        Mood and Affect: Mood normal.      UC Treatments / Results  Labs (all labs ordered are listed, but only abnormal results are displayed) Labs Reviewed  CULTURE, GROUP A STREP ([facility])  NOVEL CORONAVIRUS, NAA (HOSPITAL ORDER, SEND-OUT TO REF LAB)  POCT RAPID STREP A    EKG   Radiology No results found.  Procedures Procedures (including critical care time)  Medications Ordered in UC Medications - No data to display  Initial Impression / Assessment and Plan / UC Course  I have reviewed the triage vital signs and the nursing notes.  Pertinent labs & imaging results that were available during my care of the patient were reviewed by me  and  considered in my medical decision making (see chart for details).     Sore throat-rapid strep test negative. Most likely some sort of viral illness or allergy related.  She can take over-the-counter medications as needed for symptoms. Covid pending and precautions given. Follow up as needed for continued or worsening symptoms  Final Clinical Impressions(s) / UC Diagnoses   Final diagnoses:  Sore throat     Discharge Instructions     Your strep test was negative.  We will send for culture This is most likely some sort of viral illness.  You can take over-the-counter medications as needed for your symptoms We will call you if your Covid test is positive Make sure you are quarantining and taking precautions until we get your results. Follow up as needed for continued or worsening symptoms     ED Prescriptions    None     PDMP not reviewed this encounter.   Orvan July, NP 08/17/19 [phone]

## 2019-08-16 NOTE — Discharge Instructions (Signed)
Your strep test was negative.  We will send for culture This is most likely some sort of viral illness.  You can take over-the-counter medications as needed for your symptoms We will call you if your Covid test is positive Make sure you are quarantining and taking precautions until we get your results. Follow up as needed for continued or worsening symptoms

## 2019-08-18 LAB — CULTURE, GROUP A STREP (THRC)

## 2019-08-18 LAB — NOVEL CORONAVIRUS, NAA (HOSP ORDER, SEND-OUT TO REF LAB; TAT 18-24 HRS): SARS-CoV-2, NAA: NOT DETECTED

## 2019-09-07 ENCOUNTER — Ambulatory Visit: Payer: BC Managed Care – PPO | Admitting: Registered"

## 2019-09-07 HISTORY — PX: WISDOM TOOTH EXTRACTION: SHX21

## 2019-09-17 ENCOUNTER — Other Ambulatory Visit (HOSPITAL_COMMUNITY)
Admission: RE | Admit: 2019-09-17 | Discharge: 2019-09-17 | Disposition: A | Discharge status: Home / Self Care | Payer: BC Managed Care – PPO | Source: Ambulatory Visit | Attending: Obstetrics and Gynecology | Admitting: Obstetrics and Gynecology

## 2019-09-17 DIAGNOSIS — Z20828 Contact with and (suspected) exposure to other viral communicable diseases: Secondary | ICD-10-CM | POA: Diagnosis not present

## 2019-09-17 DIAGNOSIS — Z01812 Encounter for preprocedural laboratory examination: Secondary | ICD-10-CM | POA: Diagnosis present

## 2019-09-18 LAB — NOVEL CORONAVIRUS, NAA (HOSP ORDER, SEND-OUT TO REF LAB; TAT 18-24 HRS): SARS-CoV-2, NAA: NOT DETECTED

## 2019-09-19 NOTE — H&P (Signed)
Angel Clayton is an 38 y.[id number] female diagnosed with ectopic pregnancy opting for surgical management. She denies pain or vaginal bleeding at this time.   Pertinent Gynecological History: Bleeding: none DES exposure: unknown Blood transfusions: none Sexually transmitted diseases: none Previous GYN Procedures: none  Last pap: normal Date: 01/2017 OB History: G2, P0110   Menstrual History: Menarche age:38 No LMP recorded.    Past Medical History:  Diagnosis Date  . ALLERGIC RHINITIS 07/11/2009  . Hx of breast reduction, elective   . OBESITY 05/16/2007  . Papilledema of both eyes 09/2018  . Pilonidal cyst     Past Surgical History:  Procedure Laterality Date  . BREAST REDUCTION SURGERY  2005  . LAPAROSCOPIC GASTRIC SLEEVE RESECTION  2016    Family History  Problem Relation Age of Onset  . Hypertension Mother   . Pseudotumor cerebri Mother   . Hypertension Father     Social History:  reports that she has never smoked. She has never used smokeless tobacco. She reports previous alcohol use. She reports that she does not use drugs.  Allergies: No Known Allergies  No medications prior to admission.    Review of Systems  Constitutional: Negative for fatigue and fever.  Eyes: Negative for visual disturbance.  Respiratory: Negative for shortness of breath.   Cardiovascular: Negative for palpitations and leg swelling.  Gastrointestinal: Negative for abdominal distention, abdominal pain, constipation, nausea and vomiting.  Genitourinary: Negative for pelvic pain, vaginal bleeding and vaginal pain.  Allergic/Immunologic: Negative for environmental allergies.  Neurological: Negative for dizziness.  Psychiatric/Behavioral: The patient is not nervous/anxious.     There were no vitals taken for this visit. Physical Exam  Constitutional: She appears well-developed.  Respiratory: Effort normal.  GI: Soft.  Musculoskeletal:        General: Normal range of motion.   Cervical back: Normal range of motion.  Neurological: She is alert.  Skin: Skin is warm.  Psychiatric: She has a normal mood and affect. Her behavior is normal. Judgment and thought content normal.    No results found for this or any previous visit (from the past 24 hour(s)).  No results found.  Assessment/Plan: 38yo G2P0110 female with ectopic pregnancy for surgical management via laparoscopic removal of ectopic pregnancy, possible salpingectomy, possible oopherectomy, possible open - NPO per ERAS - Consent verified - Pt to OR when ready  Isaiah Serge 09/19/2019, 4:53 PM

## 2019-09-20 ENCOUNTER — Encounter (HOSPITAL_COMMUNITY): Payer: Self-pay | Admitting: Obstetrics and Gynecology

## 2019-09-20 ENCOUNTER — Other Ambulatory Visit: Payer: Self-pay

## 2019-09-20 NOTE — Progress Notes (Signed)
Patient denies shortness of breath, fever, cough and chest pain.  PCP - Dr Alain Marion Cardiologist - denies  Chest x-ray - denies EKG - denies Stress Test - denies ECHO - denies Cardiac Cath - denies  ERAS: Clears til 10 am DOS, no drink.  Anesthesia review: No  STOP now taking any Aspirin (unless otherwise instructed by your surgeon), Aleve, Naproxen, Ibuprofen, Motrin, Advil, Goody's, BC's, all herbal medications, fish oil, and all vitamins.   Coronavirus Screening Covid test on 12/12 was negative.  Patient verbalized understanding of instructions that were given via phone.

## 2019-09-21 ENCOUNTER — Ambulatory Visit (HOSPITAL_COMMUNITY): Payer: BC Managed Care – PPO | Admitting: Certified Registered Nurse Anesthetist

## 2019-09-21 ENCOUNTER — Ambulatory Visit (HOSPITAL_COMMUNITY)
Admission: RE | Admit: 2019-09-21 | Discharge: 2019-09-21 | Disposition: A | Discharge status: Home / Self Care | Payer: BC Managed Care – PPO | Attending: Obstetrics and Gynecology | Admitting: Obstetrics and Gynecology

## 2019-09-21 ENCOUNTER — Encounter (HOSPITAL_COMMUNITY)
Admission: RE | Disposition: A | Discharge status: Home / Self Care | Payer: Self-pay | Source: Home / Self Care | Attending: Obstetrics and Gynecology

## 2019-09-21 ENCOUNTER — Encounter (HOSPITAL_COMMUNITY): Payer: Self-pay | Admitting: Obstetrics and Gynecology

## 2019-09-21 ENCOUNTER — Other Ambulatory Visit: Payer: Self-pay

## 2019-09-21 DIAGNOSIS — O00202 Left ovarian pregnancy without intrauterine pregnancy: Secondary | ICD-10-CM

## 2019-09-21 DIAGNOSIS — Z3A00 Weeks of gestation of pregnancy not specified: Secondary | ICD-10-CM | POA: Diagnosis not present

## 2019-09-21 DIAGNOSIS — Z9884 Bariatric surgery status: Secondary | ICD-10-CM | POA: Insufficient documentation

## 2019-09-21 DIAGNOSIS — O99210 Obesity complicating pregnancy, unspecified trimester: Secondary | ICD-10-CM | POA: Diagnosis not present

## 2019-09-21 DIAGNOSIS — O009 Unspecified ectopic pregnancy without intrauterine pregnancy: Secondary | ICD-10-CM | POA: Diagnosis present

## 2019-09-21 HISTORY — PX: LAPAROSCOPY: SHX197

## 2019-09-21 LAB — HEMOGLOBIN: Hemoglobin: 10.7 g/dL — ABNORMAL LOW (ref 12.0–15.0)

## 2019-09-21 LAB — TYPE AND SCREEN
ABO/RH(D): B POS
Antibody Screen: NEGATIVE

## 2019-09-21 SURGERY — LAPAROSCOPY, DIAGNOSTIC
Anesthesia: General

## 2019-09-21 MED ORDER — KETOROLAC TROMETHAMINE 30 MG/ML IJ SOLN
INTRAMUSCULAR | Status: AC
  Filled 2019-09-21: qty 1

## 2019-09-21 MED ORDER — KETOROLAC TROMETHAMINE 30 MG/ML IJ SOLN: 30.0000 mg | Freq: Once | INTRAMUSCULAR | Status: DC

## 2019-09-21 MED ORDER — LACTATED RINGERS IV SOLN: INTRAVENOUS | Status: DC

## 2019-09-21 MED ORDER — OXYCODONE HCL 5 MG/5ML PO SOLN: 5.0000 mg | Freq: Once | ORAL | Status: DC | PRN

## 2019-09-21 MED ORDER — IBUPROFEN 600 MG PO TABS: 600.0000 mg | ORAL_TABLET | Freq: Four times a day (QID) | ORAL | 1 refills | Status: DC | PRN

## 2019-09-21 MED ORDER — LIDOCAINE 2% (20 MG/ML) 5 ML SYRINGE
INTRAMUSCULAR | Status: DC | PRN
  Administered 2019-09-21: 100 mg via INTRAVENOUS

## 2019-09-21 MED ORDER — OXYCODONE-ACETAMINOPHEN 5-325 MG PO TABS
1.0000 | ORAL_TABLET | Freq: Once | ORAL | Status: AC
  Administered 2019-09-21: 16:00:00 1 via ORAL

## 2019-09-21 MED ORDER — DEXAMETHASONE SODIUM PHOSPHATE 10 MG/ML IJ SOLN
INTRAMUSCULAR | Status: AC
  Filled 2019-09-21: qty 1

## 2019-09-21 MED ORDER — BUPIVACAINE HCL (PF) 0.25 % IJ SOLN
INTRAMUSCULAR | Status: AC
  Filled 2019-09-21: qty 30

## 2019-09-21 MED ORDER — ACETAMINOPHEN 500 MG PO TABS
1000.0000 mg | ORAL_TABLET | ORAL | Status: AC
  Administered 2019-09-21: 1000 mg via ORAL
  Filled 2019-09-21: qty 2

## 2019-09-21 MED ORDER — FENTANYL CITRATE (PF) 100 MCG/2ML IJ SOLN
INTRAMUSCULAR | Status: DC | PRN
  Administered 2019-09-21: 50 ug via INTRAVENOUS
  Administered 2019-09-21: 150 ug via INTRAVENOUS
  Administered 2019-09-21 (×2): 100 ug via INTRAVENOUS
  Administered 2019-09-21 (×2): 50 ug via INTRAVENOUS

## 2019-09-21 MED ORDER — ONDANSETRON HCL 4 MG/2ML IJ SOLN
INTRAMUSCULAR | Status: DC | PRN
  Administered 2019-09-21: 4 mg via INTRAVENOUS

## 2019-09-21 MED ORDER — MIDAZOLAM HCL 2 MG/2ML IJ SOLN
INTRAMUSCULAR | Status: AC
  Filled 2019-09-21: qty 2

## 2019-09-21 MED ORDER — FENTANYL CITRATE (PF) 100 MCG/2ML IJ SOLN
INTRAMUSCULAR | Status: AC
  Filled 2019-09-21: qty 2

## 2019-09-21 MED ORDER — OXYCODONE-ACETAMINOPHEN 5-325 MG PO TABS
ORAL_TABLET | ORAL | Status: AC
  Filled 2019-09-21: qty 1

## 2019-09-21 MED ORDER — DEXAMETHASONE SODIUM PHOSPHATE 10 MG/ML IJ SOLN
INTRAMUSCULAR | Status: DC | PRN
  Administered 2019-09-21: 4 mg via INTRAVENOUS

## 2019-09-21 MED ORDER — SUGAMMADEX SODIUM 200 MG/2ML IV SOLN
INTRAVENOUS | Status: DC | PRN
  Administered 2019-09-21: 200 mg via INTRAVENOUS

## 2019-09-21 MED ORDER — ONDANSETRON HCL 4 MG/2ML IJ SOLN
INTRAMUSCULAR | Status: AC
  Filled 2019-09-21: qty 2

## 2019-09-21 MED ORDER — ROCURONIUM BROMIDE 10 MG/ML (PF) SYRINGE
PREFILLED_SYRINGE | INTRAVENOUS | Status: DC | PRN
  Administered 2019-09-21: 60 mg via INTRAVENOUS
  Administered 2019-09-21: 10 mg via INTRAVENOUS

## 2019-09-21 MED ORDER — OXYCODONE-ACETAMINOPHEN 5-325 MG PO TABS: 1.0000 | ORAL_TABLET | ORAL | 0 refills | Status: AC | PRN

## 2019-09-21 MED ORDER — SCOPOLAMINE 1 MG/3DAYS TD PT72
MEDICATED_PATCH | TRANSDERMAL | Status: DC | PRN
  Administered 2019-09-21: 1 via TRANSDERMAL

## 2019-09-21 MED ORDER — FENTANYL CITRATE (PF) 100 MCG/2ML IJ SOLN
25.0000 ug | INTRAMUSCULAR | Status: DC | PRN
  Administered 2019-09-21: 50 ug via INTRAVENOUS

## 2019-09-21 MED ORDER — BUPIVACAINE HCL (PF) 0.25 % IJ SOLN
INTRAMUSCULAR | Status: DC | PRN
  Administered 2019-09-21: 16 mL

## 2019-09-21 MED ORDER — KETOROLAC TROMETHAMINE 30 MG/ML IJ SOLN
30.0000 mg | Freq: Once | INTRAMUSCULAR | Status: AC | PRN
  Administered 2019-09-21: 30 mg via INTRAVENOUS

## 2019-09-21 MED ORDER — MIDAZOLAM HCL 5 MG/5ML IJ SOLN
INTRAMUSCULAR | Status: DC | PRN
  Administered 2019-09-21: 2 mg via INTRAVENOUS

## 2019-09-21 MED ORDER — PROPOFOL 10 MG/ML IV BOLUS
INTRAVENOUS | Status: AC
  Filled 2019-09-21: qty 20

## 2019-09-21 MED ORDER — SODIUM CHLORIDE 0.9 % IR SOLN
Status: DC | PRN
  Administered 2019-09-21: 1000 mL

## 2019-09-21 MED ORDER — PROMETHAZINE HCL 25 MG/ML IJ SOLN: 6.2500 mg | INTRAMUSCULAR | Status: DC | PRN

## 2019-09-21 MED ORDER — OXYCODONE HCL 5 MG PO TABS: 5.0000 mg | ORAL_TABLET | Freq: Once | ORAL | Status: DC | PRN

## 2019-09-21 MED ORDER — 0.9 % SODIUM CHLORIDE (POUR BTL) OPTIME
TOPICAL | Status: DC | PRN
  Administered 2019-09-21: 1000 mL

## 2019-09-21 MED ORDER — FENTANYL CITRATE (PF) 250 MCG/5ML IJ SOLN
INTRAMUSCULAR | Status: AC
  Filled 2019-09-21: qty 5

## 2019-09-21 MED ORDER — PROPOFOL 10 MG/ML IV BOLUS
INTRAVENOUS | Status: DC | PRN
  Administered 2019-09-21: 200 mg via INTRAVENOUS

## 2019-09-21 MED ORDER — SCOPOLAMINE 1 MG/3DAYS TD PT72
MEDICATED_PATCH | TRANSDERMAL | Status: AC
  Filled 2019-09-21: qty 1

## 2019-09-21 SURGICAL SUPPLY — 38 items
APPLICATOR ARISTA FLEXITIP XL (MISCELLANEOUS) ×3 IMPLANT
CABLE HIGH FREQUENCY MONO STRZ (ELECTRODE) IMPLANT
CATH ROBINSON RED A/P 16FR (CATHETERS) ×3 IMPLANT
COVER WAND RF STERILE (DRAPES) ×3 IMPLANT
DERMABOND ADVANCED (GAUZE/BANDAGES/DRESSINGS) ×2
DERMABOND ADVANCED .7 DNX12 (GAUZE/BANDAGES/DRESSINGS) ×1 IMPLANT
DRSG OPSITE POSTOP 3X4 (GAUZE/BANDAGES/DRESSINGS) IMPLANT
DURAPREP 26ML APPLICATOR (WOUND CARE) ×3 IMPLANT
FILTER SMOKE EVAC LAPAROSHD (FILTER) ×3 IMPLANT
GLOVE BIO SURGEON STRL SZ 6.5 (GLOVE) ×2 IMPLANT
GLOVE BIO SURGEONS STRL SZ 6.5 (GLOVE) ×1
GLOVE BIOGEL PI IND STRL 7.0 (GLOVE) ×1 IMPLANT
GLOVE BIOGEL PI INDICATOR 7.0 (GLOVE) ×2
GOWN STRL REUS W/ TWL LRG LVL3 (GOWN DISPOSABLE) ×2 IMPLANT
GOWN STRL REUS W/TWL LRG LVL3 (GOWN DISPOSABLE) ×4
HEMOSTAT ARISTA ABSORB 3G PWDR (HEMOSTASIS) ×3 IMPLANT
KIT TURNOVER KIT B (KITS) ×3 IMPLANT
MANIPULATOR UTERINE 7CM CLEARV (MISCELLANEOUS) IMPLANT
NS IRRIG 1000ML POUR BTL (IV SOLUTION) ×3 IMPLANT
PACK LAPAROSCOPY BASIN (CUSTOM PROCEDURE TRAY) ×3 IMPLANT
PACK TRENDGUARD 450 HYBRID PRO (MISCELLANEOUS) IMPLANT
POUCH SPECIMEN RETRIEVAL 10MM (ENDOMECHANICALS) ×3 IMPLANT
PROTECTOR NERVE ULNAR (MISCELLANEOUS) ×6 IMPLANT
SET IRRIG TUBING LAPAROSCOPIC (IRRIGATION / IRRIGATOR) ×3 IMPLANT
SET TUBE SMOKE EVAC HIGH FLOW (TUBING) ×3 IMPLANT
SHEARS HARMONIC ACE PLUS 36CM (ENDOMECHANICALS) IMPLANT
SLEEVE ENDOPATH XCEL 5M (ENDOMECHANICALS) ×3 IMPLANT
SOLUTION ELECTROLUBE (MISCELLANEOUS) IMPLANT
SUT VIC AB 3-0 PS2 18 (SUTURE) ×2
SUT VIC AB 3-0 PS2 18XBRD (SUTURE) ×1 IMPLANT
SUT VICRYL 0 UR6 27IN ABS (SUTURE) ×3 IMPLANT
SYR 30ML LL (SYRINGE) ×3 IMPLANT
SYSTEM CARTER THOMASON II (TROCAR) IMPLANT
TOWEL GREEN STERILE FF (TOWEL DISPOSABLE) ×6 IMPLANT
TRENDGUARD 450 HYBRID PRO PACK (MISCELLANEOUS)
TROCAR XCEL NON-BLD 11X100MML (ENDOMECHANICALS) IMPLANT
TROCAR XCEL NON-BLD 5MMX100MML (ENDOMECHANICALS) ×3 IMPLANT
WARMER LAPAROSCOPE (MISCELLANEOUS) ×3 IMPLANT

## 2019-09-21 NOTE — Interval H&P Note (Signed)
History and Physical Interval Note: Pt seen. Doing well with no complaints of pain. Reviewed procedure with her once more Discussed post op expectations. Answered questions.  Consent for procedure confirmed To OR when ready  09/21/2019 12:46 PM  Angel Clayton  has presented today for surgery, with the diagnosis of ectopic pregnancy.  The various methods of treatment have been discussed with the patient and family. After consideration of risks, benefits and other options for treatment, the patient has consented to  Procedure(s) with comments: LAPAROSCOPY DIAGNOSTIC, removal of ectopic (N/A) POSSIBLE UNILATERAL SALPINGECTOMY (N/A) - possible POSSIBLE LAPAROSCOPIC UNILATERAL SALPINGO OOPHORECTOMY (N/A) - possible as a surgical intervention.  The patient's history has been reviewed, patient examined, no change in status, stable for surgery.  I have reviewed the patient's chart and labs.  Questions were answered to the patient's satisfaction.     Isaiah Serge

## 2019-09-21 NOTE — Anesthesia Postprocedure Evaluation (Signed)
Anesthesia Post Note  Patient: Angel Clayton  Procedure(s) Performed: LAPAROSCOPY DIAGNOSTIC, removal of ectopic, PARTIAL LEFT OOPHERECTOMY (N/A )     Patient location during evaluation: PACU Anesthesia Type: General Level of consciousness: awake and alert and oriented Pain management: pain level controlled Vital Signs Assessment: post-procedure vital signs reviewed and stable Respiratory status: spontaneous breathing, nonlabored ventilation and respiratory function stable Cardiovascular status: blood pressure returned to baseline Postop Assessment: no apparent nausea or vomiting Anesthetic complications: no    Last Vitals:  Vitals:   09/21/19 1552 09/21/19 1610  BP: (!) 102/55 128/73  Pulse: 77 72  Resp: 17 18  Temp:    SpO2: 100% 100%    Last Pain:  Vitals:   09/21/19 1610  TempSrc:   PainSc: [place]

## 2019-09-21 NOTE — Anesthesia Procedure Notes (Signed)
Procedure Name: Intubation Date/Time: 09/21/2019 1:37 PM Performed by: Moshe Salisbury, CRNA Pre-anesthesia Checklist: Patient identified, Emergency Drugs available, Suction available and Patient being monitored Patient Re-evaluated:Patient Re-evaluated prior to induction Oxygen Delivery Method: Circle System Utilized Preoxygenation: Pre-oxygenation with 100% oxygen Induction Type: IV induction Ventilation: Mask ventilation without difficulty Laryngoscope Size: Mac and 4 Grade View: Grade I Tube type: Oral Number of attempts: 1 Airway Equipment and Method: Stylet Placement Confirmation: ETT inserted through vocal cords under direct vision,  positive ETCO2 and breath sounds checked- equal and bilateral Secured at: 21 cm Tube secured with: Tape Dental Injury: Teeth and Oropharynx as per pre-operative assessment

## 2019-09-21 NOTE — Anesthesia Preprocedure Evaluation (Addendum)
Anesthesia Evaluation  Patient identified by MRN, date of birth, ID band Patient awake    Reviewed: Allergy & Precautions, NPO status , Patient's Chart, lab work & pertinent test results  History of Anesthesia Complications Negative for: history of anesthetic complications  Airway Mallampati: II  TM Distance: >3 FB Neck ROM: Full    Dental  (+) Teeth Intact, Dental Advisory Given,    Pulmonary neg pulmonary ROS,    Pulmonary exam normal        Cardiovascular negative cardio ROS Normal cardiovascular exam     Neuro/Psych negative neurological ROS  negative psych ROS   GI/Hepatic negative GI ROS, Neg liver ROS,   Endo/Other  Morbid obesity  Renal/GU negative Renal ROS  negative genitourinary   Musculoskeletal negative musculoskeletal ROS (+)   Abdominal   Peds  Hematology  (+) anemia , Hgb 10.7   Anesthesia Other Findings Day of surgery medications reviewed with patient.  Reproductive/Obstetrics Ectopic pregnancy                           Anesthesia Physical Anesthesia Plan  ASA: III  Anesthesia Plan: General   Post-op Pain Management:    Induction: Intravenous, Rapid sequence and Cricoid pressure planned  PONV Risk Score and Plan: 4 or greater and Treatment may vary due to age or medical condition, Ondansetron, Dexamethasone, Midazolam and Scopolamine patch - Pre-op  Airway Management Planned: Oral ETT  Additional Equipment: None  Intra-op Plan:   Post-operative Plan: Extubation in OR  Informed Consent: I have reviewed the patients History and Physical, chart, labs and discussed the procedure including the risks, benefits and alternatives for the proposed anesthesia with the patient or authorized representative who has indicated his/her understanding and acceptance.     Dental advisory given  Plan Discussed with: CRNA, Anesthesiologist and Surgeon  Anesthesia Plan  Comments:        Anesthesia Quick Evaluation

## 2019-09-21 NOTE — Progress Notes (Signed)
Patient asked to having silver-toned/yellow tone ring to be given to husband in waiting area. Secretary gave jewelry to husband Charles @1020 .

## 2019-09-21 NOTE — Discharge Instructions (Signed)
Call office with any concerns [phone] 

## 2019-09-21 NOTE — Transfer of Care (Signed)
Immediate Anesthesia Transfer of Care Note  Patient: Angel Clayton  Procedure(s) Performed: LAPAROSCOPY DIAGNOSTIC, removal of ectopic, PARTIAL LEFT OOPHERECTOMY (N/A )  Patient Location: PACU  Anesthesia Type:General  Level of Consciousness: drowsy and patient cooperative  Airway & Oxygen Therapy: Patient Spontanous Breathing and Patient connected to nasal cannula oxygen  Post-op Assessment: Report given to RN, Post -op Vital signs reviewed and stable and Patient moving all extremities  Post vital signs: Reviewed and stable  Last Vitals:  Vitals Value Taken Time  BP 105/50 09/21/19 1538  Temp    Pulse 90 09/21/19 1538  Resp 14 09/21/19 1538  SpO2 100 % 09/21/19 1538  Vitals shown include unvalidated device data.  Last Pain:  Vitals:   09/21/19 1041  TempSrc:   PainSc: 0-No pain         Complications: No apparent anesthesia complications

## 2019-09-21 NOTE — Op Note (Signed)
Operative Note    Preoperative Diagnosis: Left sided ectopic pregnancy   Postoperative Diagnosis: Same   Procedure: Laparoscopic removal of left ectopic pregnancy, partial left oopherectomy   Surgeon: Mickle Mallory DO  Anesthesia: General  Fluids: 1566ml EBL: 2ml UOP: 77ml   Findings: Left ovarian ectopic pregnancy; congealed old blood in pelvis   Specimen: Ectopic pregnancy   Procedure Note  Patient was taken to the operating room. She was placed in the dorsal supine position with her left arm tucked at her side. General anesthesia was administered and found to be adequate. She was then placed in the dorsal lithotomy position and prepped and draped in the normal sterile fashion. An appropriate timeout was performed. A speculum was then placed in the vagina and a clearVue uterine manipulator placed. The bladder was emptied. Attention was then turned to the patient's abdomen after draping where the infraumbilical area was injected with approximately 5cc of quarter percent Marcaine. A 5 mm incision was then made within the umbilicus and the optiview trocar and camera easily introduced into the abdominal cavity with the abdomen tented upwards.Pneumoperitoneum obtained with approximate 3 L of CO2 gas. A second 14mm trocar was placed in the patients left lower uterine quadrant  And a 43mm trocar in the right under direct visualization.   With patient in Trendelenburg, the pelvic cavity was noted to have a moderate to large amount of congealed old blood and clots. Once these had been evacuated with the suction, the left ovary was noted to have a partially extruded mass attached. It was hemostatic at this time. The ectopic pregnancy was excised along with necrotic ovarian tissue. Using the harmonic and a spatula, the excised ovarian bed was coagulated till hemostasis appreciated. Arista was then applied. The ectopic preganncy was then removed with a bag and sent to pathology. Copious irrigation of  the pelvic cavity was performed. General Surgeon Dr Donne Hazel was consulted for an intra operative opinion regarding bowel that appeared to have an adhesion to it - confirmed old blood adherent The right fallopian tube appeared to be in normal condition. A chromopertubation was then performed using normal saline. It was difficult to clearly noted saline  extruding from both fimbria. Thus, at the point the pt was flattened and the trocars in left and right quadrants removed under visualization. Pneumoperitoneum was reduced. The umbilical trocar was then removed. A deep stitch using a U-R needle was used to close the larger right trocar site. The incision sites were closed with 4-0 vicryl suture.  Patient was then awakened and taken to the recovery room in good condition. Counts were all confirmed per nursing x 2

## 2019-09-22 LAB — SURGICAL PATHOLOGY

## 2019-09-27 ENCOUNTER — Encounter: Payer: Self-pay | Admitting: *Deleted

## 2019-10-08 NOTE — L&D Delivery Note (Signed)
Operative Delivery Note She progressed to complete and pushed for a little over an hour.  Had some variable decels and rising baseline while pushing.  She requested assisted delivery due to exhaustion.  At 1:37 AM a viable female was delivered via Vaginal, Vacuum Investment banker, operational).  Presentation: vertex; Position: Left,, Occiput,, Anterior; Station: +3.  Verbal consent: obtained from patient.  Risks and benefits discussed in detail.  Risks include, but are not limited to the risks of anesthesia, bleeding, infection, damage to maternal tissues, fetal cephalhematoma.  There is also the risk of inability to effect vaginal delivery of the head, or shoulder dystocia that cannot be resolved by established maneuvers, leading to the need for emergency cesarean section.  APGAR: 6, 9; weight pending.   Placenta status: spontaneous, intact.   Cord:  with the following complications: nuchal x 1 reduced.   Anesthesia:  local Instruments: [place]wi, pulled with 3 ctx, all in green zone, no pop-offs Episiotomy: None Lacerations: 2nd degree;Perineal Suture Repair: 3.0 vicryl rapide Est. Blood Loss (mL): 300  Mom to postpartum.  Baby to Couplet care / Skin to Skin.  Leighton Roach Shana Zavaleta 09/07/2020, 2:37 AM

## 2019-11-02 ENCOUNTER — Ambulatory Visit: Payer: BC Managed Care – PPO | Attending: Internal Medicine

## 2019-11-02 DIAGNOSIS — Z20822 Contact with and (suspected) exposure to covid-19: Secondary | ICD-10-CM

## 2019-11-03 LAB — NOVEL CORONAVIRUS, NAA: SARS-CoV-2, NAA: NOT DETECTED

## 2019-11-21 IMAGING — MR MR MRV HEAD W/O CM
10 of 13 series · 31 of 48 positions shown · IV contrast (YES MH)
Comparison: None.

CONTRAST:  10 cc Gadavist

CLINICAL DATA: Worsening headache, follow-up papilledema.

EXAM:
MRI HEAD WITH CONTRAST
MRV HEAD WITHOUT CONTRAST
TECHNIQUE: Multiplanar, multiecho pulse sequences of the brain and surrounding
structures were obtained with intravenous contrast. Angiographic
images of the intracranial venous structures were obtained using MRV
technique without intravenous contrast.

[Series 3: DWI · axial · 3.0mm · 0.94mm/px · z∈[-77,+68]mm · 6 of 100 slices shown (1 of 2)]
[im 1/100]
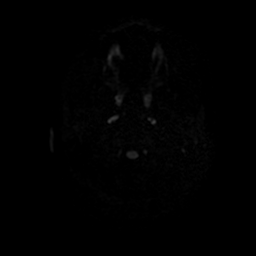
[im 20/100]
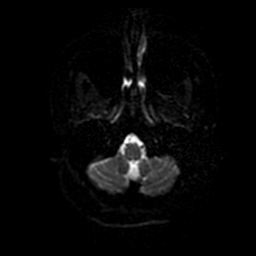
[im 40/100]
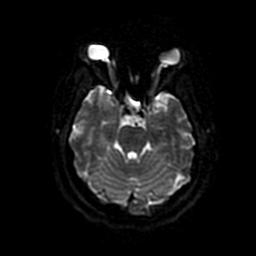
[im 60/100]
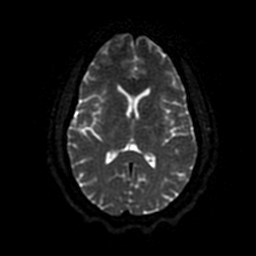
[im 80/100]
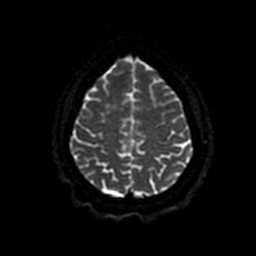
[im 100/100]
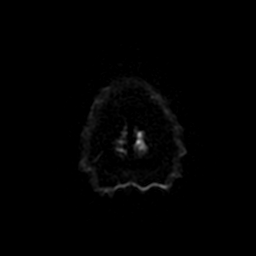

[Series 4: MRV · coronal · 1.5mm · 0.43mm/px · 5 of 142 slices shown]
[im 1/142]
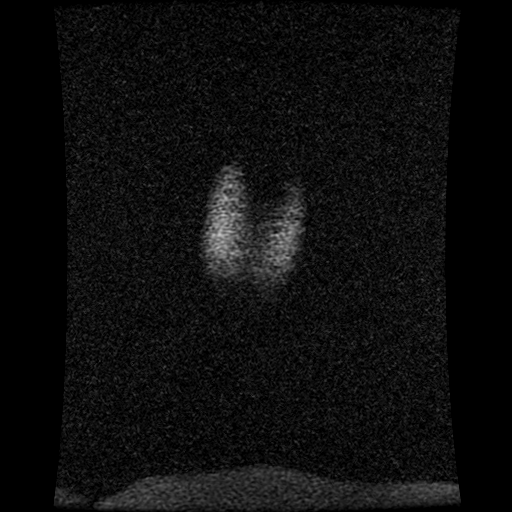
[im 18/142]
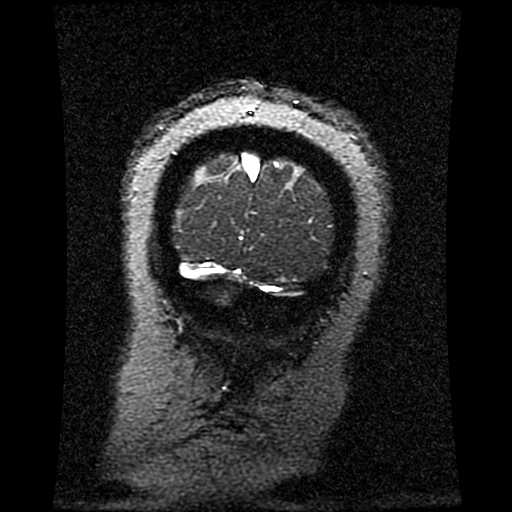
[im 36/142]
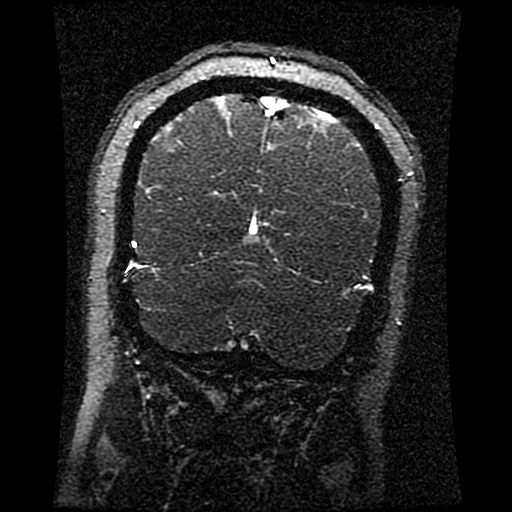
[im 53/142]
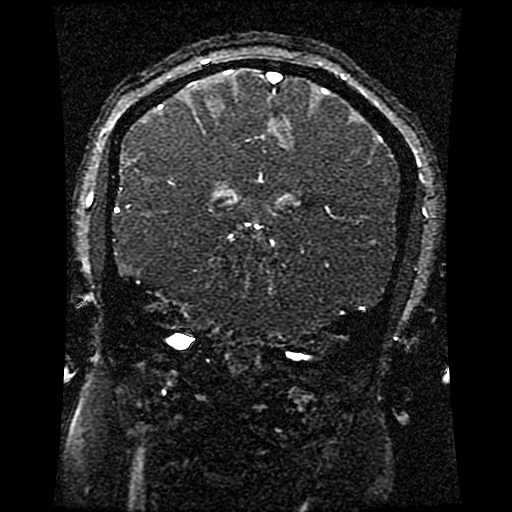
[im 89/142]
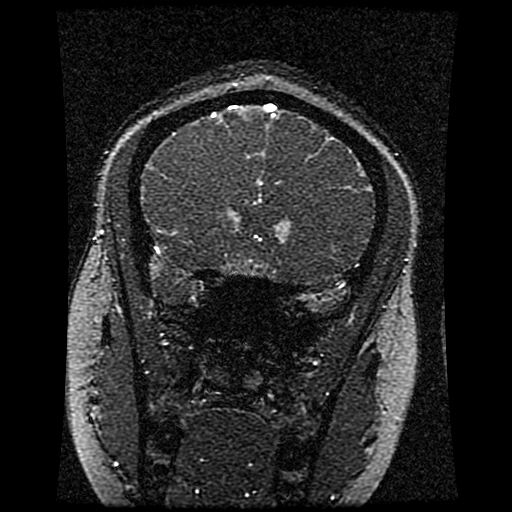

[Series 5: DWI · coronal · 4.0mm · 0.94mm/px · 5 of 72 slices shown (2 of 2)]
[im 1/72]
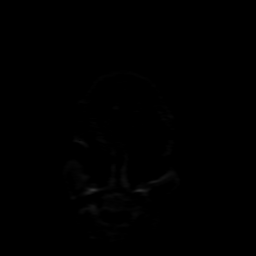
[im 18/72]
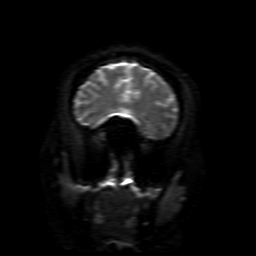
[im 36/72]
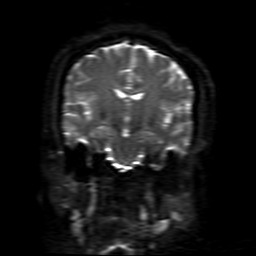
[im 54/72]
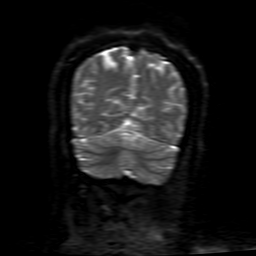
[im 72/72]
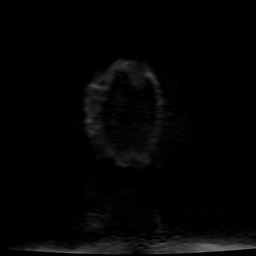

[Series 6: FLAIR · sagittal · 5.0mm · 0.47mm/px · 2 of 23 slices shown (1 of 2)]
[im 1/23]
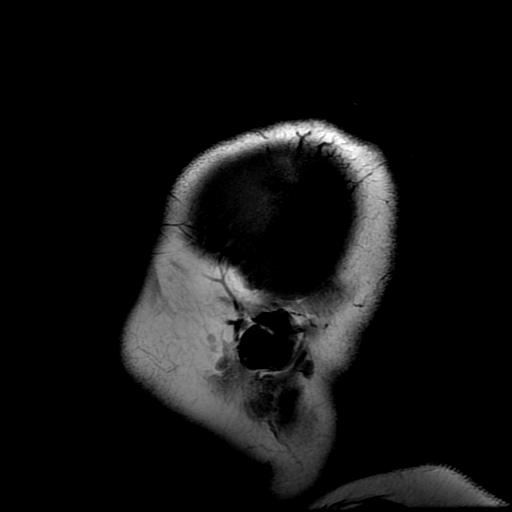
[im 23/23]
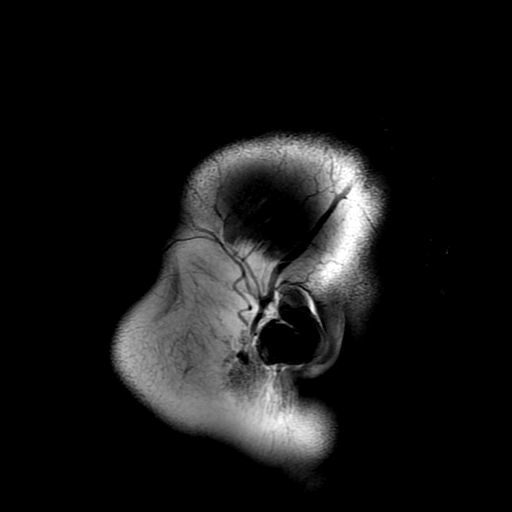

[Series 8: T2 · axial · 5.0mm · 0.47mm/px · z∈[-75,+67]mm · 2 of 25 slices shown (1 of 2)]
[im 1/25]
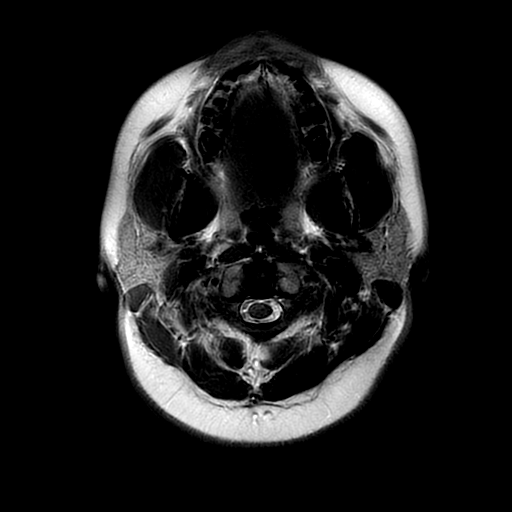
[im 25/25]
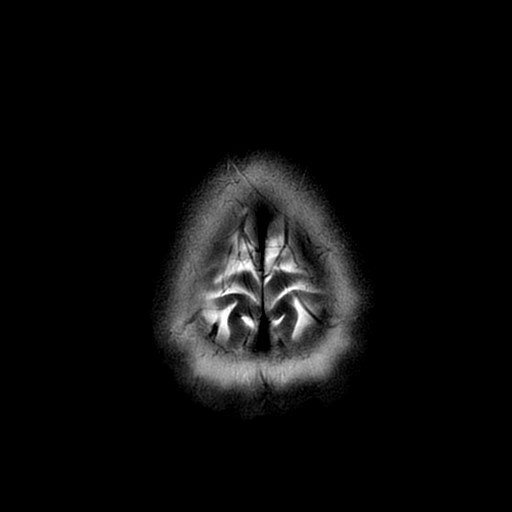

[Series 9: FLAIR · axial · 5.0mm · 0.47mm/px · z∈[-75,+67]mm · 2 of 25 slices shown (2 of 2)]
[im 1/25]
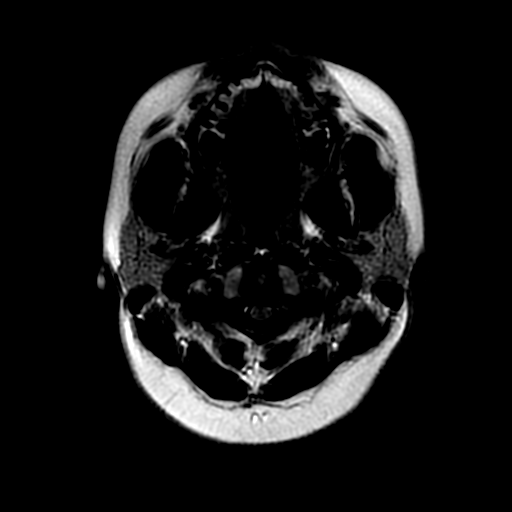
[im 25/25]
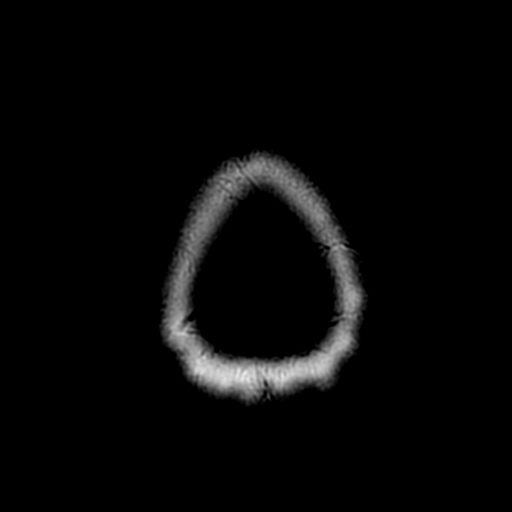

[Series 12: T2 · coronal · 5.0mm · 0.94mm/px · 2 of 30 slices shown (2 of 2)]
[im 1/30]
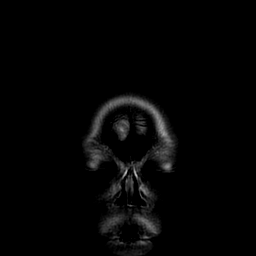
[im 30/30]
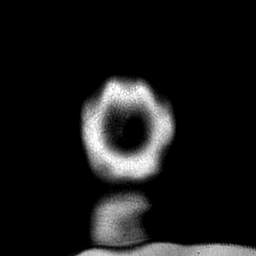

[Series 14: T1 · coronal · 5.0mm · 0.47mm/px · 2 of 30 slices shown]
[im 1/30]
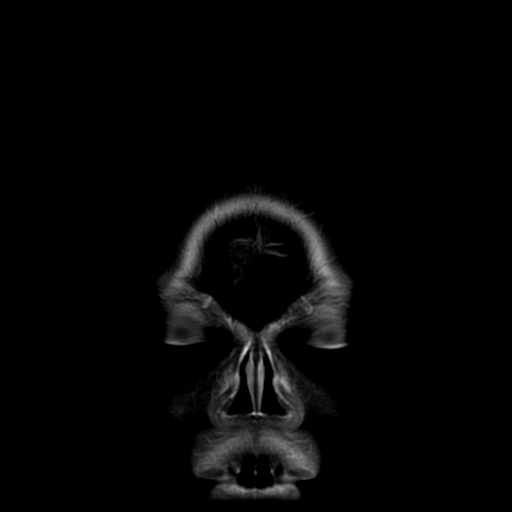
[im 30/30]
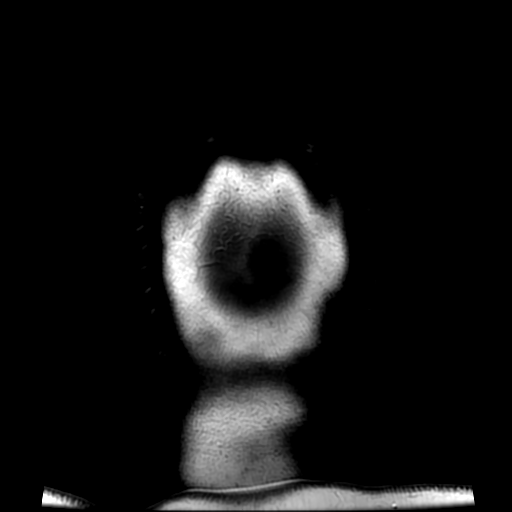

[Series 350: ADC · axial · 3.0mm · 0.94mm/px · z∈[-77,+68]mm · 3 of 50 slices shown (1 of 2)]
[im 1/50]
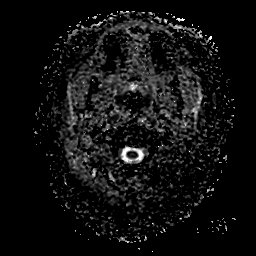
[im 25/50]
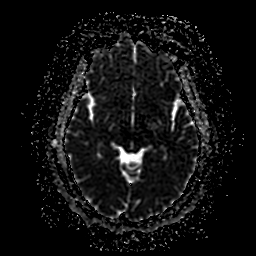
[im 50/50]
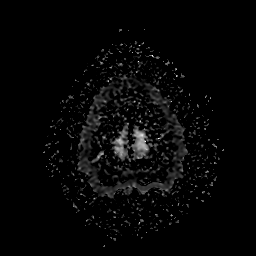

[Series 550: ADC · coronal · 4.0mm · 0.94mm/px · 2 of 36 slices shown (2 of 2)]
[im 1/36]
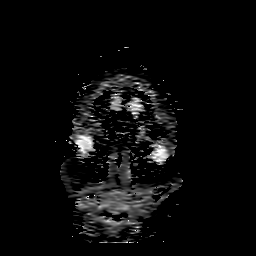
[im 36/36]
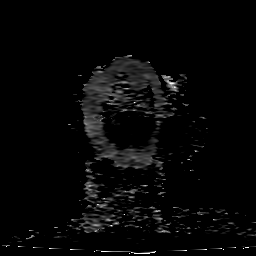

[31 of 48 positions shown; findings below may reference images not displayed]

FINDINGS: MR HEAD FINDINGS-mild motion degraded examination.

INTRACRANIAL CONTENTS: No reduced diffusion to suggest acute
ischemia or hyperacute demyelination. No susceptibility artifact to
suggest hemorrhage. The ventricles and sulci are normal for
patient's age. No suspicious parenchymal signal, masses, mass
effect. No abnormal intraparenchymal or extra-axial enhancement. No
abnormal extra-axial fluid collections. No extra-axial masses.

VASCULAR: Normal major intracranial vascular flow voids present at
skull base.

SKULL AND UPPER CERVICAL SPINE: Partially empty sella. No suspicious
calvarial bone marrow signal. Craniocervical junction maintained.

SINUSES/ORBITS: The mastoid air-cells and included paranasal sinuses
are well-aerated.The included ocular globes and orbital contents are
non-suspicious.

OTHER: None.

MRV HEAD FINDINGS

Normal flow related enhancement within the superior sagittal sinus,
torcula of the [place], bilateral transverse, sigmoid sinuses and
included internal jugular veins. RIGHT transverse sinus is dominant.
Stenotic distal RIGHT transverse sinuses. Normal flow related
enhancement of the internal cerebral veins.
IMPRESSION: MRI head:

1. Mild motion degraded examination.  No acute intracranial process.
2. Partially empty sella associated with intracranial hypertension.
3. Otherwise unremarkable MRI head with and without contrast.

MRV head:

1. No emergent large vessel occlusion.
2. Stenotic dominant RIGHT transverse sinus seen with intracranial
hypertension.

## 2020-02-21 LAB — OB RESULTS CONSOLE GC/CHLAMYDIA
Chlamydia: NEGATIVE
Gonorrhea: NEGATIVE

## 2020-02-21 LAB — OB RESULTS CONSOLE HIV ANTIBODY (ROUTINE TESTING): HIV: NONREACTIVE

## 2020-02-21 LAB — OB RESULTS CONSOLE HEPATITIS B SURFACE ANTIGEN: Hepatitis B Surface Ag: NEGATIVE

## 2020-02-21 LAB — OB RESULTS CONSOLE RUBELLA ANTIBODY, IGM: Rubella: IMMUNE

## 2020-02-24 DIAGNOSIS — N390 Urinary tract infection, site not specified: Secondary | ICD-10-CM | POA: Insufficient documentation

## 2020-03-10 ENCOUNTER — Other Ambulatory Visit: Payer: Self-pay

## 2020-03-10 ENCOUNTER — Inpatient Hospital Stay (HOSPITAL_COMMUNITY)
Admission: AD | Admit: 2020-03-10 | Discharge: 2020-03-10 | Disposition: A | Discharge status: Home / Self Care | Payer: 59 | Attending: Obstetrics and Gynecology | Admitting: Obstetrics and Gynecology

## 2020-03-10 ENCOUNTER — Encounter (HOSPITAL_COMMUNITY): Payer: Self-pay | Admitting: Obstetrics and Gynecology

## 2020-03-10 DIAGNOSIS — Z79899 Other long term (current) drug therapy: Secondary | ICD-10-CM | POA: Insufficient documentation

## 2020-03-10 DIAGNOSIS — Z3491 Encounter for supervision of normal pregnancy, unspecified, first trimester: Secondary | ICD-10-CM

## 2020-03-10 DIAGNOSIS — E669 Obesity, unspecified: Secondary | ICD-10-CM | POA: Insufficient documentation

## 2020-03-10 DIAGNOSIS — O09521 Supervision of elderly multigravida, first trimester: Secondary | ICD-10-CM | POA: Diagnosis not present

## 2020-03-10 DIAGNOSIS — Z3A12 12 weeks gestation of pregnancy: Secondary | ICD-10-CM | POA: Diagnosis not present

## 2020-03-10 DIAGNOSIS — O26891 Other specified pregnancy related conditions, first trimester: Secondary | ICD-10-CM | POA: Diagnosis present

## 2020-03-10 DIAGNOSIS — O99211 Obesity complicating pregnancy, first trimester: Secondary | ICD-10-CM | POA: Insufficient documentation

## 2020-03-10 DIAGNOSIS — R1032 Left lower quadrant pain: Secondary | ICD-10-CM | POA: Diagnosis not present

## 2020-03-10 NOTE — MAU Note (Signed)
Been having some pulling pains in lower abd. (none this morning) [place] of the country [place] wk[place]  Sent over for a quant and [place]. Has had prior visits in office with this preg,  Had an [place] that showed a viable IUP- a month ago. Was at office for panorama study to be drawn.

## 2020-03-10 NOTE — Discharge Instructions (Signed)
First Trimester of Pregnancy  The first trimester of pregnancy is from week 1 until the end of week 13 (months 1 through 3). During this time, your baby will begin to develop inside you. At 6-8 weeks, the eyes and face are formed, and the heartbeat can be seen on ultrasound. At the end of 12 weeks, all the baby's organs are formed. Prenatal care is all the medical care you receive before the birth of your baby. Make sure you get good prenatal care and follow all of your doctor's instructions. Follow these instructions at home: Medicines  Take over-the-counter and prescription medicines only as told by your doctor. Some medicines are safe and some medicines are not safe during pregnancy.  Take a prenatal vitamin that contains at least 600 micrograms (mcg) of folic acid.  If you have trouble pooping (constipation), take medicine that will make your stool soft (stool softener) if your doctor approves. Eating and drinking   Eat regular, healthy meals.  Your doctor will tell you the amount of weight gain that is right for you.  Avoid raw meat and uncooked cheese.  If you feel sick to your stomach (nauseous) or throw up (vomit): ? Eat 4 or 5 small meals a day instead of 3 large meals. ? Try eating a few soda crackers. ? Drink liquids between meals instead of during meals.  To prevent constipation: ? Eat foods that are high in fiber, like fresh fruits and vegetables, whole grains, and beans. ? Drink enough fluids to keep your pee (urine) clear or pale yellow. Activity  Exercise only as told by your doctor. Stop exercising if you have cramps or pain in your lower belly (abdomen) or low back.  Do not exercise if it is too hot, too humid, or if you are in a place of great height (high altitude).  Try to avoid standing for long periods of time. Move your legs often if you must stand in one place for a long time.  Avoid heavy lifting.  Wear low-heeled shoes. Sit and stand up  straight.  You can have sex unless your doctor tells you not to. Relieving pain and discomfort  Wear a good support bra if your breasts are sore.  Take warm water baths (sitz baths) to soothe pain or discomfort caused by hemorrhoids. Use hemorrhoid cream if your doctor says it is okay.  Rest with your legs raised if you have leg cramps or low back pain.  If you have puffy, bulging veins (varicose veins) in your legs: ? Wear support hose or compression stockings as told by your doctor. ? Raise (elevate) your feet for 15 minutes, 3-4 times a day. ? Limit salt in your food. Prenatal care  Schedule your prenatal visits by the twelfth week of pregnancy.  Write down your questions. Take them to your prenatal visits.  Keep all your prenatal visits as told by your doctor. This is important. Safety  Wear your seat belt at all times when driving.  Make a list of emergency phone numbers. The list should include numbers for family, friends, the hospital, and police and fire departments. General instructions  Ask your doctor for a referral to a local prenatal class. Begin classes no later than at the start of month 6 of your pregnancy.  Ask for help if you need counseling or if you need help with nutrition. Your doctor can give you advice or tell you where to go for help.  Do not use hot tubs, steam   rooms, or saunas.  Do not douche or use tampons or scented sanitary pads.  Do not cross your legs for long periods of time.  Avoid all herbs and alcohol. Avoid drugs that are not approved by your doctor.  Do not use any tobacco products, including cigarettes, chewing tobacco, and electronic cigarettes. If you need help quitting, ask your doctor. You may get counseling or other support to help you quit.  Avoid cat litter boxes and soil used by cats. These carry germs that can cause birth defects in the baby and can cause a loss of your baby (miscarriage) or stillbirth.  Visit your dentist.  At home, brush your teeth with a soft toothbrush. Be gentle when you floss. Contact a doctor if:  You are dizzy.  You have mild cramps or pressure in your lower belly.  You have a nagging pain in your belly area.  You continue to feel sick to your stomach, you throw up, or you have watery poop (diarrhea).  You have a bad smelling fluid coming from your vagina.  You have pain when you pee (urinate).  You have increased puffiness (swelling) in your face, hands, legs, or ankles. Get help right away if:  You have a fever.  You are leaking fluid from your vagina.  You have spotting or bleeding from your vagina.  You have very bad belly cramping or pain.  You gain or lose weight rapidly.  You throw up blood. It may look like coffee grounds.  You are around people who have [place] measles, fifth disease, or chickenpox.  You have a very bad headache.  You have shortness of breath.  You have any kind of trauma, such as from a fall or a car accident. Summary  The first trimester of pregnancy is from week 1 until the end of week 13 (months 1 through 3).  To take care of yourself and your unborn baby, you will need to eat healthy meals, take medicines only if your doctor tells you to do so, and do activities that are safe for you and your baby.  Keep all follow-up visits as told by your doctor. This is important as your doctor will have to ensure that your baby is healthy and growing well. This information is not intended to replace advice given to you by your health care provider. Make sure you discuss any questions you have with your health care provider. Document Revised: 01/14/2019 Document Reviewed: 10/01/2016 Elsevier Patient Education  2020 [employer].   Constipation, Adult Constipation is when a person:  Poops (has a bowel movement) fewer times in a week than normal.  Has a hard time pooping.  Has poop that is dry, hard, or bigger than normal. Follow these  instructions at home: Eating and drinking   Eat foods that have a lot of fiber, such as: ? Fresh fruits and vegetables. ? Whole grains. ? Beans.  Eat less of foods that are high in fat, low in fiber, or overly processed, such as: ? [place] fries. ? Hamburgers. ? Cookies. ? Candy. ? Soda.  Drink enough fluid to keep your pee (urine) clear or pale yellow. General instructions  Exercise regularly or as told by your doctor.  Go to the restroom when you feel like you need to poop. Do not hold it in.  Take over-the-counter and prescription medicines only as told by your doctor. These include any fiber supplements.  Do pelvic floor retraining exercises, such as: ? Doing deep breathing while  relaxing your lower belly (abdomen). ? Relaxing your pelvic floor while pooping.  Watch your condition for any changes.  Keep all follow-up visits as told by your doctor. This is important. Contact a doctor if:  You have pain that gets worse.  You have a fever.  You have not pooped for 4 days.  You throw up (vomit).  You are not hungry.  You lose weight.  You are bleeding from the anus.  You have thin, pencil-like poop (stool). Get help right away if:  You have a fever, and your symptoms suddenly get worse.  You leak poop or have blood in your poop.  Your belly feels hard or bigger than normal (is bloated).  You have very bad belly pain.  You feel dizzy or you faint. This information is not intended to replace advice given to you by your health care provider. Make sure you discuss any questions you have with your health care provider. Document Revised: 09/05/2017 Document Reviewed: 03/13/2016 Elsevier Patient Education  2020 [employer].

## 2020-03-10 NOTE — MAU Provider Note (Signed)
History     CSN: [id number]  Arrival date and time: 03/10/20 1210  First Provider Initiated Contact with Patient 03/10/20 1245      Chief Complaint  Patient presents with  . Abdominal Pain   HPI Angel Clayton is a 39 y.o. G3P0010 at [redacted]w[redacted]d who presents to MAU on advice from her prenatal care provider. Her chief complaint is LLQ pain. She describes her pain as a muscle cramp, which occurs about 2-3 times per day, is very brief, then resolves without intervention.  She denies vaginal bleeding, abdominal tenderness, dysuria, fever or recent illness. She is remote from sexual intercourse. She has not taken medication or tried other treatments for this complaint.  Patient [place] of [place] She was nervous about food poisoning and ate a diet comprised primarily of beans. She endorses flatus and constipation which have recently resolved with her return home and her normal diet.  Patient is s/p evaluation in office and endorses confirmation of IUP and normal viability scan. She receives care with [facility].   OB History    Gravida  3   Para  1   Term      Preterm      AB  1   Living        SAB      TAB  1   Ectopic      Multiple  0   Live Births              Past Medical History:  Diagnosis Date  . ALLERGIC RHINITIS 07/11/2009  . Hx of breast reduction, elective   . OBESITY 05/16/2007  . Papilledema of both eyes 09/2018  . Pilonidal cyst     Past Surgical History:  Procedure Laterality Date  . BREAST REDUCTION SURGERY  2005  . [place] RESECTION  2016  . LAPAROSCOPY N/A 09/21/2019   Procedure: LAPAROSCOPY DIAGNOSTIC, removal of ectopic, PARTIAL LEFT OOPHERECTOMY;  Surgeon: Sherlyn Hay, DO;  Location: [place];  Service: Gynecology;  Laterality: N/A;  . UPPER GI ENDOSCOPY  09/06/2015  . WISDOM TOOTH EXTRACTION  09/2019    Family History  Problem Relation Age of Onset  . Hypertension Mother    . Pseudotumor cerebri Mother   . Hypertension Father     Social History   Tobacco Use  . Smoking status: Never Smoker  . Smokeless tobacco: Never Used  Substance Use Topics  . Alcohol use: Not Currently  . Drug use: Never    Allergies: No Known Allergies  Medications Prior to Admission  Medication Sig Dispense Refill Last Dose  . acetaminophen (TYLENOL) 650 MG CR tablet Take 650 mg by mouth daily as needed for pain.     Marland Kitchen acetaZOLAMIDE (DIAMOX) 250 MG tablet Take 2 tablets (500 mg total) by mouth 2 (two) times daily. (Patient not taking: Reported on 09/17/2019) 120 tablet 12   . amoxicillin (AMOXIL) 500 MG capsule Take 500 mg by mouth daily.     . Diclofenac Sodium 2 % SOLN Place 2 g onto the skin 2 (two) times daily. (Patient not taking: Reported on 09/17/2019) 112 g 3   . diphenhydramine-acetaminophen (TYLENOL PM) 25-500 MG TABS tablet Take 2 tablets by mouth at bedtime.     . folic acid (FOLVITE) 314 MCG tablet Take 800 mcg by mouth daily.     . hydrOXYzine (VISTARIL) 25 MG capsule Take 1 capsule (25 mg total) by mouth at bedtime and  may repeat dose one time if needed. (Patient not taking: Reported on 09/17/2019) 30 capsule 0   . ibuprofen (ADVIL) 600 MG tablet Take 1 tablet (600 mg total) by mouth every 6 (six) hours. (Patient not taking: Reported on 09/17/2019) 30 tablet 0   . ibuprofen (ADVIL) 600 MG tablet Take 1 tablet (600 mg total) by mouth every 6 (six) hours as needed for cramping. 40 tablet 1   . Prenatal Vit-Fe Fumarate-FA (PRENATAL PO) Take 1 tablet by mouth daily.     . Vitamin D, Ergocalciferol, (DRISDOL) 50000 units CAPS capsule Take 1 capsule (50,000 Units total) by mouth every 7 (seven) days. (Patient not taking: Reported on 09/17/2019) 12 capsule 0     Review of Systems  Gastrointestinal: Positive for abdominal pain.  Genitourinary: Negative for dysuria and vaginal bleeding.  Musculoskeletal: Negative for back pain.  All other systems reviewed and are  negative.  Physical Exam   Blood pressure 135/81, pulse (!) 102, temperature 98.7 F (37.1 C), temperature source Oral, resp. rate 20, height 5\' 6"  (1.676 m), weight 115.2 kg, last menstrual period 12/15/2019, SpO2 100 %.  Physical Exam  Nursing note and vitals reviewed. Constitutional: She is oriented to person, place, and time. She appears well-developed and well-nourished.  Cardiovascular: Normal rate and normal heart sounds.  Respiratory: Effort normal and breath sounds normal.  GI: Soft.  Neurological: She is alert and oriented to person, place, and time.  Skin: Skin is warm and dry.  Psychiatric: She has a normal mood and affect. Her behavior is normal. Judgment and thought content normal.    MAU Course/MDM  Procedures  Patient Vitals for the past 24 hrs:  BP Temp Temp src Pulse Resp SpO2 Height Weight  03/10/20 1237 135/81 98.7 F (37.1 C) Oral (!) 102 20 100 % 5\' 6"  (1.676 m) 115.2 kg   Assessment and Plan  --39 y.o. G3P0010 at [redacted]w[redacted]d  --IUP Confirmed in office --FHT 159 by Doppler --Discussed with patient that in known IUP with absence of bleeding, BSUS not indicated --Additional workup declined by patient -- Advised Tylenol, warm shower for cramping  --Discharge home in stable condition  31, CNM 03/10/2020, 3:43 PM

## 2020-03-29 ENCOUNTER — Inpatient Hospital Stay (HOSPITAL_COMMUNITY)
Admission: AD | Admit: 2020-03-29 | Discharge: 2020-03-29 | Disposition: A | Discharge status: Home / Self Care | Payer: 59 | Attending: Obstetrics and Gynecology | Admitting: Obstetrics and Gynecology

## 2020-03-29 ENCOUNTER — Other Ambulatory Visit: Payer: Self-pay

## 2020-03-29 ENCOUNTER — Inpatient Hospital Stay (HOSPITAL_BASED_OUTPATIENT_CLINIC_OR_DEPARTMENT_OTHER): Payer: 59

## 2020-03-29 ENCOUNTER — Encounter (HOSPITAL_COMMUNITY): Payer: Self-pay | Admitting: Obstetrics and Gynecology

## 2020-03-29 DIAGNOSIS — E669 Obesity, unspecified: Secondary | ICD-10-CM

## 2020-03-29 DIAGNOSIS — T1490XA Injury, unspecified, initial encounter: Secondary | ICD-10-CM

## 2020-03-29 DIAGNOSIS — Z3A15 15 weeks gestation of pregnancy: Secondary | ICD-10-CM | POA: Diagnosis not present

## 2020-03-29 DIAGNOSIS — Y92410 Unspecified street and highway as the place of occurrence of the external cause: Secondary | ICD-10-CM | POA: Insufficient documentation

## 2020-03-29 DIAGNOSIS — R109 Unspecified abdominal pain: Secondary | ICD-10-CM | POA: Insufficient documentation

## 2020-03-29 DIAGNOSIS — Z79899 Other long term (current) drug therapy: Secondary | ICD-10-CM | POA: Diagnosis not present

## 2020-03-29 DIAGNOSIS — R1084 Generalized abdominal pain: Secondary | ICD-10-CM | POA: Diagnosis not present

## 2020-03-29 DIAGNOSIS — O99212 Obesity complicating pregnancy, second trimester: Secondary | ICD-10-CM | POA: Insufficient documentation

## 2020-03-29 DIAGNOSIS — O99891 Other specified diseases and conditions complicating pregnancy: Secondary | ICD-10-CM

## 2020-03-29 DIAGNOSIS — O26892 Other specified pregnancy related conditions, second trimester: Secondary | ICD-10-CM | POA: Diagnosis not present

## 2020-03-29 DIAGNOSIS — O9A212 Injury, poisoning and certain other consequences of external causes complicating pregnancy, second trimester: Secondary | ICD-10-CM

## 2020-03-29 LAB — URINALYSIS, ROUTINE W REFLEX MICROSCOPIC
Bilirubin Urine: NEGATIVE
Glucose, UA: NEGATIVE mg/dL
Hgb urine dipstick: NEGATIVE
Ketones, ur: 20 mg/dL — AB
Leukocytes,Ua: NEGATIVE
Nitrite: NEGATIVE
Protein, ur: 30 mg/dL — AB
Specific Gravity, Urine: 1.028 (ref 1.005–1.030)
pH: 5 (ref 5.0–8.0)

## 2020-03-29 NOTE — Discharge Instructions (Signed)
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
## 2020-06-30 DIAGNOSIS — D509 Iron deficiency anemia, unspecified: Secondary | ICD-10-CM | POA: Insufficient documentation

## 2020-08-17 ENCOUNTER — Encounter (HOSPITAL_COMMUNITY): Payer: Self-pay | Admitting: Obstetrics and Gynecology

## 2020-08-17 ENCOUNTER — Other Ambulatory Visit: Payer: Self-pay

## 2020-08-17 ENCOUNTER — Inpatient Hospital Stay (HOSPITAL_COMMUNITY)
Admission: AD | Admit: 2020-08-17 | Discharge: 2020-08-17 | Disposition: A | Discharge status: Home / Self Care | Payer: 59 | Attending: Obstetrics and Gynecology | Admitting: Obstetrics and Gynecology

## 2020-08-17 DIAGNOSIS — O10913 Unspecified pre-existing hypertension complicating pregnancy, third trimester: Secondary | ICD-10-CM | POA: Diagnosis not present

## 2020-08-17 DIAGNOSIS — O26893 Other specified pregnancy related conditions, third trimester: Secondary | ICD-10-CM | POA: Insufficient documentation

## 2020-08-17 DIAGNOSIS — R519 Headache, unspecified: Secondary | ICD-10-CM | POA: Diagnosis not present

## 2020-08-17 DIAGNOSIS — Z79899 Other long term (current) drug therapy: Secondary | ICD-10-CM | POA: Insufficient documentation

## 2020-08-17 DIAGNOSIS — R030 Elevated blood-pressure reading, without diagnosis of hypertension: Secondary | ICD-10-CM | POA: Insufficient documentation

## 2020-08-17 DIAGNOSIS — Z3A35 35 weeks gestation of pregnancy: Secondary | ICD-10-CM | POA: Diagnosis not present

## 2020-08-17 DIAGNOSIS — O368330 Maternal care for abnormalities of the fetal heart rate or rhythm, third trimester, not applicable or unspecified: Secondary | ICD-10-CM | POA: Diagnosis not present

## 2020-08-17 DIAGNOSIS — M7989 Other specified soft tissue disorders: Secondary | ICD-10-CM | POA: Insufficient documentation

## 2020-08-17 DIAGNOSIS — O10919 Unspecified pre-existing hypertension complicating pregnancy, unspecified trimester: Secondary | ICD-10-CM

## 2020-08-17 LAB — COMPREHENSIVE METABOLIC PANEL
ALT: 12 U/L (ref 0–44)
AST: 20 U/L (ref 15–41)
Albumin: 2.7 g/dL — ABNORMAL LOW (ref 3.5–5.0)
Alkaline Phosphatase: 121 U/L (ref 38–126)
Anion gap: 9 (ref 5–15)
BUN: <5 mg/dL — ABNORMAL LOW (ref 6–20)
CO2: 20 mmol/L — ABNORMAL LOW (ref 22–32)
Calcium: 9.2 mg/dL (ref 8.9–10.3)
Chloride: 107 mmol/L (ref 98–111)
Creatinine, Ser: 0.79 mg/dL (ref 0.44–1.00)
GFR, Estimated: >60 mL/min (ref >60)
Glucose, Bld: 109 mg/dL — ABNORMAL HIGH (ref 70–99)
Potassium: 4.2 mmol/L (ref 3.5–5.1)
Sodium: 136 mmol/L (ref 135–145)
Total Bilirubin: 0.7 mg/dL (ref 0.3–1.2)
Total Protein: 6.1 g/dL — ABNORMAL LOW (ref 6.5–8.1)

## 2020-08-17 LAB — URINALYSIS, ROUTINE W REFLEX MICROSCOPIC
Bilirubin Urine: NEGATIVE
Glucose, UA: NEGATIVE mg/dL
Hgb urine dipstick: NEGATIVE
Ketones, ur: 80 mg/dL — AB
Leukocytes,Ua: NEGATIVE
Nitrite: NEGATIVE
Protein, ur: 30 mg/dL — AB
Specific Gravity, Urine: 1.021 (ref 1.005–1.030)
pH: 5 (ref 5.0–8.0)

## 2020-08-17 LAB — CBC
HCT: 32.4 % — ABNORMAL LOW (ref 36.0–46.0)
Hemoglobin: 10.6 g/dL — ABNORMAL LOW (ref 12.0–15.0)
MCH: 30.8 pg (ref 26.0–34.0)
MCHC: 32.7 g/dL (ref 30.0–36.0)
MCV: 94.2 fL (ref 80.0–100.0)
Platelets: 219 10*3/uL (ref 150–400)
RBC: 3.44 MIL/uL — ABNORMAL LOW (ref 3.87–5.11)
RDW: 13.6 % (ref 11.5–15.5)
WBC: 7.8 10*3/uL (ref 4.0–10.5)
nRBC: 0 % (ref 0.0–0.2)

## 2020-08-17 LAB — PROTEIN / CREATININE RATIO, URINE
Creatinine, Urine: 309.39 mg/dL
Protein Creatinine Ratio: 0.08 mg/mg{Cre} (ref 0.00–0.15)
Total Protein, Urine: 26 mg/dL

## 2020-08-17 MED ORDER — BETAMETHASONE SOD PHOS & ACET 6 (3-3) MG/ML IJ SUSP
12.0000 mg | INTRAMUSCULAR | Status: DC
  Administered 2020-08-17: 12 mg via INTRAMUSCULAR
  Filled 2020-08-17: qty 5

## 2020-08-17 MED ORDER — NIFEDIPINE ER OSMOTIC RELEASE 60 MG PO TB24: 60.0000 mg | ORAL_TABLET | Freq: Every day | ORAL | 2 refills | Status: DC

## 2020-08-17 MED ORDER — NIFEDIPINE ER OSMOTIC RELEASE 30 MG PO TB24
30.0000 mg | ORAL_TABLET | Freq: Once | ORAL | Status: AC
  Administered 2020-08-17: 30 mg via ORAL
  Filled 2020-08-17: qty 1

## 2020-08-17 NOTE — MAU Provider Note (Signed)
History     [id number]  Arrival date and time: 08/17/20 1316    Chief Complaint  Patient presents with  . Hypertension     HPI Angel Clayton is a 39 y.o. at [redacted]w[redacted]d by LMP with PMHx notable for cHTN on Procardia, who presents from office for elevated BP.   Review of outside prenatal records from [facility] Office (in media tab): diagnosed w cHTN at [facility] intake and started on Procardia 30 XL, has had mild range BP's intermittently throughout pregnancy  Patient reports she was sent to MAU for PreE evaluation by Dr. Mindi Slicker due to elevated BP at her visit today Reports she woke up and her hands were swollen and went to office for BP check Went to office and BP was 140/102, sent here for eval Denies headache, vision changes/blurry vision, chest pain, shortness of breath, RUQ pain Does endorse some mild foot swelling Still taking Procardia 30 XL, but did not take her dose today, last took it yesterday evening  Denies vaginal bleeding, loss of fluid, contractions Endorses fetal movement  --/--/B POS (12/15 1050)  OB History    Gravida  3   Para  1   Term      Preterm      AB  1   Living        SAB      TAB  1   Ectopic      Multiple  0   Live Births              Past Medical History:  Diagnosis Date  . ALLERGIC RHINITIS 07/11/2009  . Hx of breast reduction, elective   . OBESITY 05/16/2007  . Papilledema of both eyes 09/2018  . Pilonidal cyst     Past Surgical History:  Procedure Laterality Date  . BREAST REDUCTION SURGERY  2005  . LAPAROSCOPIC GASTRIC SLEEVE RESECTION  2016  . LAPAROSCOPY N/A 09/21/2019   Procedure: LAPAROSCOPY DIAGNOSTIC, removal of ectopic, PARTIAL LEFT OOPHERECTOMY;  Surgeon: Edwinna Areola, DO;  Location: [facility] OR;  Service: Gynecology;  Laterality: N/A;  . UPPER GI ENDOSCOPY  09/06/2015  . WISDOM TOOTH EXTRACTION  09/2019    Family History  Problem Relation Age of Onset  . Hypertension Mother   . Pseudotumor  cerebri Mother   . Hypertension Father     Social History   Socioeconomic History  . Marital status: Married    Spouse name: Leonette Most  . Number of children: 0  . Years of education: college  . Highest education level: Not on file  Occupational History  . 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 Smoking status: Never Smoker  . Smokeless tobacco: Never Used  Vaping Use  . Vaping Use: Never used  Substance and Sexual Activity  . Alcohol use: Not Currently  . Drug use: Never  . Sexual activity: Not Currently    Comment: approx 6 wks   Other Topics Concern  . Not on file  Social History Narrative   Lives with husband   Caffeine- 2 teas daily   Social Determinants of Health   Financial Resource Strain:   . Difficulty of Paying Living Expenses: Not on file  Food Insecurity:   . Worried About Programme researcher, broadcasting/film/video in the Last Year: Not on file  . Ran Out of Food in the Last Year: Not on file  Transportation Needs:   . Lack of Transportation (Medical): Not on file  .  Lack of Transportation (Non-Medical): Not on file  Physical Activity:   . Days of Exercise per Week: Not on file  . Minutes of Exercise per Session: Not on file  Stress:   . Feeling of Stress : Not on file  Social Connections:   . Frequency of Communication with Friends and Family: Not on file  . Frequency of Social Gatherings with Friends and Family: Not on file  . Attends Religious Services: Not on file  . Active Member of Clubs or Organizations: Not on file  . Attends Banker Meetings: Not on file  . Marital Status: Not on file  Intimate Partner Violence:   . Fear of Current or Ex-Partner: Not on file  . Emotionally Abused: Not on file  . Physically Abused: Not on file  . Sexually Abused: Not on file    No Known Allergies  No current facility-administered medications on file prior to encounter.   Current Outpatient Medications on File Prior to Encounter   Medication Sig Dispense Refill  . acetaminophen (TYLENOL) 650 MG CR tablet Take 650 mg by mouth daily as needed for pain.    . diphenhydramine-acetaminophen (TYLENOL PM) 25-500 MG TABS tablet Take 2 tablets by mouth at bedtime.    Marland Kitchen NIFEdipine (ADALAT CC) 30 MG 24 hr tablet Take 30 mg by mouth daily.    . Prenatal Vit-Fe Fumarate-FA (PRENATAL PO) Take 1 tablet by mouth daily.    Marland Kitchen acetaZOLAMIDE (DIAMOX) 250 MG tablet Take 2 tablets (500 mg total) by mouth 2 (two) times daily. (Patient not taking: Reported on 09/17/2019) 120 tablet 12  . Diclofenac Sodium 2 % SOLN Place 2 g onto the skin 2 (two) times daily. (Patient not taking: Reported on 09/17/2019) 112 g 3  . folic acid (FOLVITE) 800 MCG tablet Take 800 mcg by mouth daily.    . hydrOXYzine (VISTARIL) 25 MG capsule Take 1 capsule (25 mg total) by mouth at bedtime and may repeat dose one time if needed. (Patient not taking: Reported on 09/17/2019) 30 capsule 0  . pyridOXINE (VITAMIN B-6) 100 MG tablet Take 100 mg by mouth daily.    . Vitamin D, Ergocalciferol, (DRISDOL) 50000 units CAPS capsule Take 1 capsule (50,000 Units total) by mouth every 7 (seven) days. (Patient not taking: Reported on 09/17/2019) 12 capsule 0     ROS Pertinent positives and negative per HPI, all others reviewed and negative  Physical Exam   BP (!) 143/90   Pulse (!) 104   Temp 98.5 F (36.9 C) (Oral)   Resp 16   LMP 12/15/2019   SpO2 96%   Patient Vitals for the past 24 hrs:  BP Temp Temp src Pulse Resp SpO2  08/17/20 1531 (!) 143/90 -- -- (!) 104 -- --  08/17/20 1516 140/84 -- -- 97 -- --  08/17/20 1500 (!) 147/86 -- -- 100 -- 96 %  08/17/20 1445 (!) 147/83 -- -- 95 -- 96 %  08/17/20 1431 133/75 -- -- (!) 107 -- --  08/17/20 1416 (!) 153/81 -- -- (!) 106 -- --  08/17/20 1401 (!) 153/82 -- -- (!) 111 -- --  08/17/20 1345 (!) 156/78 -- -- 100 -- 98 %  08/17/20 1338 (!) 148/78 98.5 F (36.9 C) Oral (!) 116 16 100 %     Physical Exam Vitals  reviewed.  Constitutional:      General: She is not in acute distress.    Appearance: She is well-developed. She is not diaphoretic.  Eyes:  General: No scleral icterus. Pulmonary:     Effort: Pulmonary effort is normal. No respiratory distress.  Abdominal:     General: There is no distension.     Palpations: Abdomen is soft.     Tenderness: There is no abdominal tenderness. There is no guarding or rebound.  Skin:    General: Skin is warm and dry.  Neurological:     Mental Status: She is alert.     Coordination: Coordination normal.     Cervical Exam  Not performed  Bedside Ultrasound Not done  My interpretation: n/a  FHT Baseline 150, moderate variability, +accels, -decels Toco: rare CTX Cat: I, reactive NST  Labs Results for orders placed or performed during the hospital encounter of 08/17/20 (from the past 24 hour(s))  Urinalysis, Routine w reflex microscopic Urine, Clean Catch     Status: Abnormal   Collection Time: 08/17/20  1:49 PM  Result Value Ref Range   Color, Urine AMBER (A) YELLOW   APPearance CLOUDY (A) CLEAR   Specific Gravity, Urine 1.021 1.005 - 1.030   pH 5.0 5.0 - 8.0   Glucose, UA NEGATIVE NEGATIVE mg/dL   Hgb urine dipstick NEGATIVE NEGATIVE   Bilirubin Urine NEGATIVE NEGATIVE   Ketones, ur 80 (A) NEGATIVE mg/dL   Protein, ur 30 (A) NEGATIVE mg/dL   Nitrite NEGATIVE NEGATIVE   Leukocytes,Ua NEGATIVE NEGATIVE   RBC / HPF 0-5 0 - 5 RBC/hpf   WBC, UA 6-10 0 - 5 WBC/hpf   Bacteria, UA RARE (A) NONE SEEN   Squamous Epithelial / LPF 21-50 0 - 5   Mucus PRESENT   Protein / creatinine ratio, urine     Status: None   Collection Time: 08/17/20  1:57 PM  Result Value Ref Range   Creatinine, Urine 309.39 mg/dL   Total Protein, Urine 26 mg/dL   Protein Creatinine Ratio 0.08 0.00 - 0.15 mg/mg[Cre]  CBC     Status: Abnormal   Collection Time: 08/17/20  2:04 PM  Result Value Ref Range   WBC 7.8 4.0 - 10.5 K/uL   RBC 3.44 (L) 3.87 - 5.11 MIL/uL    Hemoglobin 10.6 (L) 12.0 - 15.0 g/dL   HCT 81.132.4 (L) 36 - 46 %   MCV 94.2 80.0 - 100.0 fL   MCH 30.8 26.0 - 34.0 pg   MCHC 32.7 30.0 - 36.0 g/dL   RDW 91.413.6 78.211.5 - 95.615.5 %   Platelets 219 150 - 400 K/uL   nRBC 0.0 0.0 - 0.2 %  Comprehensive metabolic panel     Status: Abnormal   Collection Time: 08/17/20  2:04 PM  Result Value Ref Range   Sodium 136 135 - 145 mmol/L   Potassium 4.2 3.5 - 5.1 mmol/L   Chloride 107 98 - 111 mmol/L   CO2 20 (L) 22 - 32 mmol/L   Glucose, Bld 109 (H) 70 - 99 mg/dL   BUN <5 (L) 6 - 20 mg/dL   Creatinine, Ser 2.130.79 0.44 - 1.00 mg/dL   Calcium 9.2 8.9 - 08.610.3 mg/dL   Total Protein 6.1 (L) 6.5 - 8.1 g/dL   Albumin 2.7 (L) 3.5 - 5.0 g/dL   AST 20 15 - 41 U/L   ALT 12 0 - 44 U/L   Alkaline Phosphatase 121 38 - 126 U/L   Total Bilirubin 0.7 0.3 - 1.2 mg/dL   GFR, Estimated >57>60 >84>60 mL/min   Anion gap 9 5 - 15    Imaging No results found.  MAU  Course  Procedures  Lab Orders     Urinalysis, Routine w reflex microscopic Urine, Clean Catch     CBC     Comprehensive metabolic panel     Protein / creatinine ratio, urine Meds ordered this encounter  Medications  . NIFEdipine (PROCARDIA-XL/NIFEDICAL-XL) 24 hr tablet 30 mg  . betamethasone acetate-betamethasone sodium phosphate (CELESTONE) injection 12 mg   Imaging Orders  No imaging studies ordered today    MDM moderate  Assessment and Plan  #cHTN, ruled out for PreE Patient asymptomatic Mild range in MAU without severe range BP's Labs unremarkable and at baseline compared to earlier in pregnancy Unlikely to be superimposed PreE but is slightly above her baseline compared to prior office values Will increase Nifedipine 30>60 mg XL and give dose of betamethasone, patient to return tomorrow for second shot Reviewed warning signs of pre-eclampsia/return precautions Has appt next week with primary provider Plan reviewed with Dr. Ellyn Hack who is in agreement  #FWB FHT Cat I NST: Reactive  Venora Maples

## 2020-08-17 NOTE — MAU Note (Signed)
. °  Angel Clayton is a 39 y.o. at [redacted]w[redacted]d here in MAU reporting: Sent by Dr. Mindi Slicker from office for elevated BP at her appointment today. No HA, blurry vision, or epigastric pain. States she did have swelling in her hands this morning but it has gone down. No VB or LOF. Endorses good fetal movement.   Pain score: 0 Vitals:   08/17/20 1338  BP: (!) 148/78  Pulse: (!) 116  Resp: 16  Temp: 98.5 F (36.9 C)  SpO2: 100%     FHT:150 Lab orders placed from triage: UA

## 2020-08-17 NOTE — Discharge Instructions (Signed)
Please return tomorrow afternoon for your second shot of betamethasone to protect baby in case of preterm delivery.   Hypertension During Pregnancy Hypertension is also called high blood pressure. High blood pressure means that the force of your blood moving in your body is too strong. It can cause problems for you and your baby. Different types of high blood pressure can happen during pregnancy. The types are:  High blood pressure before you got pregnant. This is called chronic hypertension.  This can continue during your pregnancy. Your doctor will want to keep checking your blood pressure. You may need medicine to keep your blood pressure under control while you are pregnant. You will need follow-up visits after you have your baby.  High blood pressure that goes up during pregnancy when it was normal before. This is called gestational hypertension. It will usually get better after you have your baby, but your doctor will need to watch your blood pressure to make sure that it is getting better.  Very high blood pressure during pregnancy. This is called preeclampsia. Very high blood pressure is an emergency that needs to be checked and treated right away.  You may develop very high blood pressure after giving birth. This is called postpartum preeclampsia. This usually occurs within 48 hours after childbirth but may occur up to 6 weeks after giving birth. This is rare. How does this affect me? If you have high blood pressure during pregnancy, you have a higher chance of developing high blood pressure:  As you get older.  If you get pregnant again. In some cases, high blood pressure during pregnancy can cause:  Stroke.  Heart attack.  Damage to the kidneys, lungs, or liver.  Preeclampsia.  Jerky movements you cannot control (convulsions or seizures).  Problems with the placenta. How does this affect my baby? Your baby may:  Be born early.  Not weigh as much as he or she  should.  Not handle labor well, leading to a c-section birth. What are the risks?  Having high blood pressure during a past pregnancy.  Being overweight.  Being 72 years old or older.  Being pregnant for the first time.  Being pregnant with more than one baby.  Becoming pregnant using fertility methods, such as IVF.  Having other problems, such as diabetes, or kidney disease.  Having family members who have high blood pressure. What can I do to lower my risk?   Keep a healthy weight.  Eat a healthy diet.  Follow what your doctor tells you about treating any medical problems that you had before becoming pregnant. It is very important to go to all of your doctor visits. Your doctor will check your blood pressure and make sure that your pregnancy is progressing as it should. Treatment should start early if a problem is found. How is this treated? Treatment for high blood pressure during pregnancy can differ depending on the type of high blood pressure you have and how serious it is.  You may need to take blood pressure medicine.  If you have been taking medicine for your blood pressure, you may need to change the medicine during pregnancy if it is not safe for your baby.  If your doctor thinks that you could get very high blood pressure, he or she may tell you to take a low-dose aspirin during your pregnancy.  If you have very high blood pressure, you may need to stay in the hospital so you and your baby can be watched  closely. You may also need to take medicine to lower your blood pressure. This medicine may be given by mouth or through an IV tube.  In some cases, if your condition gets worse, you may need to have your baby early. Follow these instructions at home: Eating and drinking   Drink enough fluid to keep your pee (urine) pale yellow.  Avoid caffeine. Lifestyle  Do not use any products that contain nicotine or tobacco, such as cigarettes, e-cigarettes, and  chewing tobacco. If you need help quitting, ask your doctor.  Do not use alcohol or drugs.  Avoid stress.  Rest and get plenty of sleep.  Regular exercise can help. Ask your doctor what kinds of exercise are best for you. General instructions  Take over-the-counter and prescription medicines only as told by your doctor.  Keep all prenatal and follow-up visits as told by your doctor. This is important. Contact a doctor if:  You have symptoms that your doctor told you to watch for, such as: ? Headaches. ? Nausea. ? Vomiting. ? Belly (abdominal) pain. ? Dizziness. ? Light-headedness. Get help right away if:  You have: ? Very bad belly pain that does not get better with treatment. ? A very bad headache that does not get better. ? Vomiting that does not get better. ? Sudden, fast weight gain. ? Sudden swelling in your hands, ankles, or face. ? Bleeding from your vagina. ? Blood in your pee. ? Blurry vision. ? Double vision. ? Shortness of breath. ? Chest pain. ? Weakness on one side of your body. ? Trouble talking.  Your baby is not moving as much as usual. Summary  High blood pressure is also called hypertension.  High blood pressure means that the force of your blood moving in your body is too strong.  High blood pressure can cause problems for you and your baby.  Keep all follow-up visits as told by your doctor. This is important. This information is not intended to replace advice given to you by your health care provider. Make sure you discuss any questions you have with your health care provider. Document Revised: 01/14/2019 Document Reviewed: 10/20/2018 Elsevier Patient Education  2020 [employer]    Signs and Symptoms of Labor Labor is your body's natural process of moving your baby, placenta, and umbilical cord out of your uterus. The process of labor usually starts when your baby is full-term, between 5537 and 40 weeks of pregnancy. How will I know when  I am close to going into labor? As your body prepares for labor and the birth of your baby, you may notice the following symptoms in the weeks and days before true labor starts:  Having a strong desire to get your home ready to receive your new baby. This is called nesting. Nesting may be a sign that labor is approaching, and it may occur several weeks before birth. Nesting may involve cleaning and organizing your home.  Passing a small amount of thick, bloody mucus out of your vagina (normal bloody show or losing your mucus plug). This may happen more than a week before labor begins, or it might occur right before labor begins as the opening of the cervix starts to widen (dilate). For some women, the entire mucus plug passes at once. For others, smaller portions of the mucus plug may gradually pass over several days.  Your baby moving (dropping) lower in your pelvis to get into position for birth (lightening). When this happens, you may feel more  pressure on your bladder and pelvic bone and less pressure on your ribs. This may make it easier to breathe. It may also cause you to need to urinate more often and have problems with bowel movements.  Having "practice contractions" (Braxton Hicks contractions) that occur at irregular (unevenly spaced) intervals that are more than 10 minutes apart. This is also called false labor. False labor contractions are common after exercise or sexual activity, and they will stop if you change position, rest, or drink fluids. These contractions are usually mild and do not get stronger over time. They may feel like: ? A backache or back pain. ? Mild cramps, similar to menstrual cramps. ? Tightening or pressure in your abdomen. Other early symptoms that labor may be starting soon include:  Nausea or loss of appetite.  Diarrhea.  Having a sudden burst of energy, or feeling very tired.  Mood changes.  Having trouble sleeping. How will I know when labor has  begun? Signs that true labor has begun may include:  Having contractions that come at regular (evenly spaced) intervals and increase in intensity. This may feel like more intense tightening or pressure in your abdomen that moves to your back. ? Contractions may also feel like rhythmic pain in your upper thighs or back that comes and goes at regular intervals. ? For first-time mothers, this change in intensity of contractions often occurs at a more gradual pace. ? Women who have given birth before may notice a more rapid progression of contraction changes.  Having a feeling of pressure in the vaginal area.  Your water breaking (rupture of membranes). This is when the sac of fluid that surrounds your baby breaks. When this happens, you will notice fluid leaking from your vagina. This may be clear or blood-tinged. Labor usually starts within 24 hours of your water breaking, but it may take longer to begin. ? Some women notice this as a gush of fluid. ? Others notice that their underwear repeatedly becomes damp. Follow these instructions at home:   When labor starts, or if your water breaks, call your health care provider or nurse care line. Based on your situation, they will determine when you should go in for an exam.  When you are in early labor, you may be able to rest and manage symptoms at home. Some strategies to try at home include: ? Breathing and relaxation techniques. ? Taking a warm bath or shower. ? Listening to music. ? Using a heating pad on the lower back for pain. If you are directed to use heat:  Place a towel between your skin and the heat source.  Leave the heat on for 20-30 minutes.  Remove the heat if your skin turns bright red. This is especially important if you are unable to feel pain, heat, or cold. You may have a greater risk of getting burned. Get help right away if:  You have painful, regular contractions that are 5 minutes apart or less.  Labor starts before  you are [redacted] weeks along in your pregnancy.  You have a fever.  You have a headache that does not go away.  You have bright red blood coming from your vagina.  You do not feel your baby moving.  You have a sudden onset of: ? Severe headache with vision problems. ? Nausea, vomiting, or diarrhea. ? Chest pain or shortness of breath. These symptoms may be an emergency. If your health care provider recommends that you go to the hospital or  birth center where you plan to deliver, do not drive yourself. Have someone else drive you, or call emergency services (911 in the [place] Summary  Labor is your body's natural process of moving your baby, placenta, and umbilical cord out of your uterus.  The process of labor usually starts when your baby is full-term, between 78 and 40 weeks of pregnancy.  When labor starts, or if your water breaks, call your health care provider or nurse care line. Based on your situation, they will determine when you should go in for an exam. This information is not intended to replace advice given to you by your health care provider. Make sure you discuss any questions you have with your health care provider. Document Revised: 06/23/2017 Document Reviewed: 02/28/2017 Elsevier Patient Education  2020 [employer].

## 2020-08-18 ENCOUNTER — Inpatient Hospital Stay (HOSPITAL_COMMUNITY)
Admission: AD | Admit: 2020-08-18 | Discharge: 2020-08-18 | Disposition: A | Discharge status: Home / Self Care | Payer: 59 | Attending: Obstetrics and Gynecology | Admitting: Obstetrics and Gynecology

## 2020-08-18 ENCOUNTER — Encounter (HOSPITAL_COMMUNITY): Payer: Self-pay | Admitting: Obstetrics and Gynecology

## 2020-08-18 ENCOUNTER — Other Ambulatory Visit: Payer: Self-pay

## 2020-08-18 ENCOUNTER — Inpatient Hospital Stay (HOSPITAL_BASED_OUTPATIENT_CLINIC_OR_DEPARTMENT_OTHER): Payer: 59

## 2020-08-18 DIAGNOSIS — Z3689 Encounter for other specified antenatal screening: Secondary | ICD-10-CM

## 2020-08-18 DIAGNOSIS — O26893 Other specified pregnancy related conditions, third trimester: Secondary | ICD-10-CM | POA: Diagnosis not present

## 2020-08-18 DIAGNOSIS — O368330 Maternal care for abnormalities of the fetal heart rate or rhythm, third trimester, not applicable or unspecified: Secondary | ICD-10-CM | POA: Insufficient documentation

## 2020-08-18 DIAGNOSIS — O10013 Pre-existing essential hypertension complicating pregnancy, third trimester: Secondary | ICD-10-CM

## 2020-08-18 DIAGNOSIS — Z79899 Other long term (current) drug therapy: Secondary | ICD-10-CM | POA: Insufficient documentation

## 2020-08-18 DIAGNOSIS — O99213 Obesity complicating pregnancy, third trimester: Secondary | ICD-10-CM

## 2020-08-18 DIAGNOSIS — Z3A35 35 weeks gestation of pregnancy: Secondary | ICD-10-CM

## 2020-08-18 DIAGNOSIS — R519 Headache, unspecified: Secondary | ICD-10-CM | POA: Diagnosis not present

## 2020-08-18 DIAGNOSIS — E669 Obesity, unspecified: Secondary | ICD-10-CM | POA: Diagnosis not present

## 2020-08-18 DIAGNOSIS — O09523 Supervision of elderly multigravida, third trimester: Secondary | ICD-10-CM | POA: Diagnosis not present

## 2020-08-18 DIAGNOSIS — O10919 Unspecified pre-existing hypertension complicating pregnancy, unspecified trimester: Secondary | ICD-10-CM

## 2020-08-18 DIAGNOSIS — O10913 Unspecified pre-existing hypertension complicating pregnancy, third trimester: Secondary | ICD-10-CM | POA: Insufficient documentation

## 2020-08-18 DIAGNOSIS — O368390 Maternal care for abnormalities of the fetal heart rate or rhythm, unspecified trimester, not applicable or unspecified: Secondary | ICD-10-CM

## 2020-08-18 HISTORY — DX: Essential (primary) hypertension: I10

## 2020-08-18 LAB — URINALYSIS, ROUTINE W REFLEX MICROSCOPIC
Bilirubin Urine: NEGATIVE
Glucose, UA: >=500 mg/dL — AB
Hgb urine dipstick: NEGATIVE
Ketones, ur: 20 mg/dL — AB
Leukocytes,Ua: NEGATIVE
Nitrite: NEGATIVE
Protein, ur: 100 mg/dL — AB
Specific Gravity, Urine: 1.032 — ABNORMAL HIGH (ref 1.005–1.030)
pH: 5 (ref 5.0–8.0)

## 2020-08-18 LAB — PROTEIN / CREATININE RATIO, URINE
Creatinine, Urine: 307.98 mg/dL
Protein Creatinine Ratio: 0.09 mg/mg{Cre} (ref 0.00–0.15)
Total Protein, Urine: 29 mg/dL

## 2020-08-18 LAB — CBC
HCT: 30.2 % — ABNORMAL LOW (ref 36.0–46.0)
Hemoglobin: 10.1 g/dL — ABNORMAL LOW (ref 12.0–15.0)
MCH: 31.6 pg (ref 26.0–34.0)
MCHC: 33.4 g/dL (ref 30.0–36.0)
MCV: 94.4 fL (ref 80.0–100.0)
Platelets: 244 10*3/uL (ref 150–400)
RBC: 3.2 MIL/uL — ABNORMAL LOW (ref 3.87–5.11)
RDW: 13.6 % (ref 11.5–15.5)
WBC: 13.9 10*3/uL — ABNORMAL HIGH (ref 4.0–10.5)
nRBC: 0 % (ref 0.0–0.2)

## 2020-08-18 LAB — COMPREHENSIVE METABOLIC PANEL
ALT: 14 U/L (ref 0–44)
AST: 23 U/L (ref 15–41)
Albumin: 2.8 g/dL — ABNORMAL LOW (ref 3.5–5.0)
Alkaline Phosphatase: 114 U/L (ref 38–126)
Anion gap: 11 (ref 5–15)
BUN: 7 mg/dL (ref 6–20)
CO2: 16 mmol/L — ABNORMAL LOW (ref 22–32)
Calcium: 8.9 mg/dL (ref 8.9–10.3)
Chloride: 108 mmol/L (ref 98–111)
Creatinine, Ser: 0.92 mg/dL (ref 0.44–1.00)
GFR, Estimated: >60 mL/min (ref >60)
Glucose, Bld: 172 mg/dL — ABNORMAL HIGH (ref 70–99)
Potassium: 3.8 mmol/L (ref 3.5–5.1)
Sodium: 135 mmol/L (ref 135–145)
Total Bilirubin: 0.4 mg/dL (ref 0.3–1.2)
Total Protein: 6.2 g/dL — ABNORMAL LOW (ref 6.5–8.1)

## 2020-08-18 MED ORDER — BETAMETHASONE SOD PHOS & ACET 6 (3-3) MG/ML IJ SUSP
12.0000 mg | Freq: Once | INTRAMUSCULAR | Status: AC
  Administered 2020-08-18: 12 mg via INTRAMUSCULAR

## 2020-08-18 MED ORDER — ACETAMINOPHEN 500 MG PO TABS
1000.0000 mg | ORAL_TABLET | Freq: Once | ORAL | Status: AC
  Administered 2020-08-18: 1000 mg via ORAL
  Filled 2020-08-18: qty 2

## 2020-08-18 MED ORDER — CYCLOBENZAPRINE HCL 5 MG PO TABS
10.0000 mg | ORAL_TABLET | Freq: Once | ORAL | Status: AC
  Administered 2020-08-18: 10 mg via ORAL
  Filled 2020-08-18: qty 2

## 2020-08-18 NOTE — MAU Note (Signed)
Pt here for 2nd dose of Betamethasone, noted to be shading eyes in lobby.  Woke up with a HA this morning, took BP medication and ES Tylenol, took a nap, still has HA and is light sensitive. Denies epigastric pain or increase in swelling.

## 2020-08-18 NOTE — MAU Provider Note (Signed)
History     CSN: [id number]  Arrival date and time: 08/18/20 1601   First Provider Initiated Contact with Patient 08/18/20 1728      Chief Complaint  Patient presents with  . 2nd dose Betamethasone   Ms. Angel Clayton is a 39 y.o. G3P0010 at [redacted]w[redacted]d who presents to MAU for preeclampsia evaluation after she came to MAU for her second betamethasone and reported a headache. Patient reports history of papilledema, but reports when she went to [facility] to be evaluated, she did not actually have it. Patient reports this was in 2019, but she stopped having headaches shortly after she had the visit with the specialist at [facility]. Patient reports she was given a medication to use PRN, but does not remember what the medication was. Patient reports she did not use that medication very often.  Patient reports her HA started last night before she went to sleep, but reports it was mild. Then when she woke up today she reports feeling nauseous and that the headache was more severe. Patient reports she took her 60mg  XL Procardia at 930AM this morning. Patient reports before she took her Tylenol this morning she rated HA as 9/10. Then she took one extra strength Tylenol around 1PM that did not help her headache. Patient reports she is currently at 6/10 for pain with light sensitivity. Patient reports she has not been diagnosed with migraines, but reports she does have a history of headaches that she was working on with her PCP.   Patient also reports DFM since this morning, but reports the movement has been back to normal since being in the hospital.  Pt denies blurry vision/seeing spots, N/V, epigastric pain, swelling in face and hands, sudden weight gain. Pt denies chest pain and SOB.  Pt denies constipation, diarrhea, or urinary problems. Pt denies fever, chills, fatigue, sweating or changes in appetite. Pt denies dizziness, light-headedness, weakness.  Pt denies VB, ctx, LOF and reports good FM.  Current  pregnancy problems? CHTN, short cervix, anemia Blood Type? B Positive Allergies? NKDA Current medications? Procardia, PNVs, iron, Tylenol PM at night Current PNC & next appt? [facility], next appt 08/25/2020   OB History    Gravida  3   Para  1   Term      Preterm      AB  1   Living        SAB  0   TAB  0   Ectopic  1   Multiple  0   Live Births           Obstetric Comments  Row 2 not a "para", due to gestation, unable to change        Past Medical History:  Diagnosis Date  . ALLERGIC RHINITIS 07/11/2009  . Hx of breast reduction, elective   . Hypertension   . OBESITY 05/16/2007  . Papilledema of both eyes 09/2018  . Pilonidal cyst     Past Surgical History:  Procedure Laterality Date  . BREAST REDUCTION SURGERY  2005  . LAPAROSCOPIC GASTRIC SLEEVE RESECTION  2016  . LAPAROSCOPY N/A 09/21/2019   Procedure: LAPAROSCOPY DIAGNOSTIC, removal of ectopic, PARTIAL LEFT OOPHERECTOMY;  Surgeon: 09/23/2019, DO;  Location: [facility] OR;  Service: Gynecology;  Laterality: N/A;  . UPPER GI ENDOSCOPY  09/06/2015  . WISDOM TOOTH EXTRACTION  09/2019    Family History  Problem Relation Age of Onset  . Hypertension Mother   . Pseudotumor cerebri Mother   .  Hypertension Father     Social History   Tobacco Use  . Smoking status: Never Smoker  . Smokeless tobacco: Never Used  Vaping Use  . Vaping Use: Never used  Substance Use Topics  . Alcohol use: Not Currently  . Drug use: Never    Allergies: No Known Allergies  Medications Prior to Admission  Medication Sig Dispense Refill Last Dose  . diphenhydramine-acetaminophen (TYLENOL PM) 25-500 MG TABS tablet Take 2 tablets by mouth at bedtime.   08/18/2020 at Unknown time  . NIFEdipine (PROCARDIA XL/NIFEDICAL XL) 60 MG 24 hr tablet Take 1 tablet (60 mg total) by mouth daily. 30 tablet 2 08/18/2020 at Unknown time  . Prenatal Vit-Fe Fumarate-FA (PRENATAL PO) Take 1 tablet by mouth daily.   08/18/2020  at Unknown time  . acetaminophen (TYLENOL) 650 MG CR tablet Take 650 mg by mouth daily as needed for pain.     . folic acid (FOLVITE) 800 MCG tablet Take 800 mcg by mouth daily.     . hydrOXYzine (VISTARIL) 25 MG capsule Take 1 capsule (25 mg total) by mouth at bedtime and may repeat dose one time if needed. 30 capsule 0   . pyridOXINE (VITAMIN B-6) 100 MG tablet Take 100 mg by mouth daily.    at not taking    Review of Systems  Constitutional: Negative for chills, diaphoresis, fatigue and fever.  Eyes: Negative for visual disturbance.  Respiratory: Negative for shortness of breath.   Cardiovascular: Negative for chest pain.  Gastrointestinal: Negative for abdominal pain, constipation, diarrhea, nausea and vomiting.  Genitourinary: Negative for dysuria, flank pain, frequency, pelvic pain, urgency, vaginal bleeding and vaginal discharge.  Neurological: Positive for headaches. Negative for dizziness, weakness and light-headedness.   Physical Exam   Blood pressure (!) 143/84, pulse 88, temperature 98.6 F (37 C), resp. rate 18, height 5\' 6"  (1.676 m), weight 121.3 kg, last menstrual period 12/15/2019, SpO2 99 %.  Patient Vitals for the past 24 hrs:  BP Temp Temp src Pulse Resp SpO2 Height Weight  08/18/20 2201 (!) 143/84 -- -- 88 -- -- -- --  08/18/20 2146 135/78 -- -- 92 -- -- -- --  08/18/20 2132 (!) 148/82 -- -- 88 -- -- -- --  08/18/20 2119 (!) 147/85 -- -- 90 -- -- -- --  08/18/20 2035 -- -- -- -- -- 99 % -- --  08/18/20 2031 (!) 151/92 -- -- 94 -- -- -- --  08/18/20 2030 -- -- -- -- -- 98 % -- --  08/18/20 2025 -- -- -- -- -- 99 % -- --  08/18/20 2020 -- -- -- -- -- 100 % -- --  08/18/20 2016 (!) 145/94 -- -- 96 -- 100 % -- --  08/18/20 2015 -- -- -- -- -- 100 % -- --  08/18/20 2010 -- -- -- -- -- 100 % -- --  08/18/20 2009 -- 98.6 F (37 C) -- -- -- 100 % -- --  08/18/20 2005 -- -- -- -- -- 98 % -- --  08/18/20 2001 (!) 146/89 -- -- 93 -- -- -- --  08/18/20 2000 -- -- -- --  -- 99 % -- --  08/18/20 1955 -- -- -- -- -- 99 % -- --  08/18/20 1950 -- -- -- -- -- 99 % -- --  08/18/20 1946 139/88 -- -- 91 -- -- -- --  08/18/20 1945 -- -- -- -- -- 98 % -- --  08/18/20 1940 -- -- -- -- --  99 % -- --  08/18/20 1938 (!) 148/92 -- -- 97 -- -- -- --  08/18/20 1935 -- -- -- -- -- 99 % -- --  08/18/20 1923 -- -- -- -- -- 94 % -- --  08/18/20 1920 -- -- -- -- -- 94 % -- --  08/18/20 1910 -- -- -- -- -- 99 % -- --  08/18/20 1905 -- -- -- -- -- 98 % -- --  08/18/20 1901 140/76 -- -- 93 -- -- -- --  08/18/20 1900 -- -- -- -- -- 98 % -- --  08/18/20 1855 -- -- -- -- -- 99 % -- --  08/18/20 1850 -- -- -- -- -- 99 % -- --  08/18/20 1847 134/76 -- -- 93 -- 99 % -- --  08/18/20 1846 134/76 -- -- 93 -- -- -- --  08/18/20 1845 -- -- -- -- -- 99 % -- --  08/18/20 1840 -- -- -- -- -- 99 % -- --  08/18/20 1835 -- -- -- -- -- 99 % -- --  08/18/20 1831 134/83 -- -- 100 -- -- -- --  08/18/20 1830 -- -- -- -- -- 100 % -- --  08/18/20 1825 -- -- -- -- -- 100 % -- --  08/18/20 1820 -- -- -- -- -- 99 % -- --  08/18/20 1816 140/84 -- -- (!) 102 -- -- -- --  08/18/20 1813 -- -- -- -- -- 99 % -- --  08/18/20 1805 -- -- -- -- -- 100 % -- --  08/18/20 1801 (!) 141/77 -- -- 91 -- -- -- --  08/18/20 1800 -- -- -- -- -- 99 % -- --  08/18/20 1755 -- -- -- -- -- 99 % -- --  08/18/20 1750 -- -- -- -- -- 100 % -- --  08/18/20 1746 129/76 -- -- 88 -- -- -- --  08/18/20 1745 -- -- -- -- -- 100 % -- --  08/18/20 1740 -- -- -- -- -- 99 % -- --  08/18/20 1735 -- -- -- -- -- 100 % -- --  08/18/20 1730 -- -- -- -- -- 99 % -- --  08/18/20 1720 -- -- -- -- -- 99 % -- --  08/18/20 1716 124/83 -- -- 98 -- -- -- --  08/18/20 1715 -- -- -- -- -- 100 % -- --  08/18/20 1712 135/81 -- -- 97 -- -- -- --  08/18/20 1710 -- -- -- -- -- 100 % -- --  08/18/20 1705 -- -- -- -- -- 100 % -- --  08/18/20 1655 (!) 147/82 -- -- 96 -- 97 % -- --  08/18/20 1650 -- -- -- -- -- 99 % -- --  08/18/20 1620 (!) 154/82 98.7  F (37.1 C) Oral (!) 102 18 98 %  (1.676 m) 121.3 kg   Physical Exam Vitals and nursing note reviewed.  Constitutional:      General: She is not in acute distress.    Appearance: Normal appearance. She is not ill-appearing, toxic-appearing or diaphoretic.  HENT:     Head: Normocephalic and atraumatic.  Pulmonary:     Effort: Pulmonary effort is normal.  Neurological:     Mental Status: She is alert and oriented to person, place, and time.  Psychiatric:        Mood and Affect: Mood normal.        Behavior: Behavior normal.        Thought Content: Thought content normal.  Judgment: Judgment normal.    Results for orders placed or performed during the hospital encounter of 08/18/20 (from the past 24 hour(s))  Protein / creatinine ratio, urine     Status: None   Collection Time: 08/18/20  4:56 PM  Result Value Ref Range   Creatinine, Urine 307.98 mg/dL   Total Protein, Urine 29 mg/dL   Protein Creatinine Ratio 0.09 0.00 - 0.15 mg/mg[Cre]  CBC     Status: Abnormal   Collection Time: 08/18/20  4:57 PM  Result Value Ref Range   WBC 13.9 (H) 4.0 - 10.5 K/uL   RBC 3.20 (L) 3.87 - 5.11 MIL/uL   Hemoglobin 10.1 (L) 12.0 - 15.0 g/dL   HCT 16.130.2 (L) 36 - 46 %   MCV 94.4 80.0 - 100.0 fL   MCH 31.6 26.0 - 34.0 pg   MCHC 33.4 30.0 - 36.0 g/dL   RDW 09.613.6 04.511.5 - 40.915.5 %   Platelets 244 150 - 400 K/uL   nRBC 0.0 0.0 - 0.2 %  Comprehensive metabolic panel     Status: Abnormal   Collection Time: 08/18/20  4:57 PM  Result Value Ref Range   Sodium 135 135 - 145 mmol/L   Potassium 3.8 3.5 - 5.1 mmol/L   Chloride 108 98 - 111 mmol/L   CO2 16 (L) 22 - 32 mmol/L   Glucose, Bld 172 (H) 70 - 99 mg/dL   BUN 7 6 - 20 mg/dL   Creatinine, Ser 8.110.92 0.44 - 1.00 mg/dL   Calcium 8.9 8.9 - 91.410.3 mg/dL   Total Protein 6.2 (L) 6.5 - 8.1 g/dL   Albumin 2.8 (L) 3.5 - 5.0 g/dL   AST 23 15 - 41 U/L   ALT 14 0 - 44 U/L   Alkaline Phosphatase 114 38 - 126 U/L   Total Bilirubin 0.4 0.3 - 1.2 mg/dL    GFR, Estimated >78>60 >29>60 mL/min   Anion gap 11 5 - 15  Urinalysis, Routine w reflex microscopic Urine, Clean Catch     Status: Abnormal   Collection Time: 08/18/20  4:57 PM  Result Value Ref Range   Color, Urine YELLOW YELLOW   APPearance CLOUDY (A) CLEAR   Specific Gravity, Urine 1.032 (H) 1.005 - 1.030   pH 5.0 5.0 - 8.0   Glucose, UA >=500 (A) NEGATIVE mg/dL   Hgb urine dipstick NEGATIVE NEGATIVE   Bilirubin Urine NEGATIVE NEGATIVE   Ketones, ur 20 (A) NEGATIVE mg/dL   Protein, ur 562100 (A) NEGATIVE mg/dL   Nitrite NEGATIVE NEGATIVE   Leukocytes,Ua NEGATIVE NEGATIVE   RBC / HPF 6-10 0 - 5 RBC/hpf   WBC, UA 11-20 0 - 5 WBC/hpf   Bacteria, UA FEW (A) NONE SEEN   Squamous Epithelial / LPF 21-50 0 - 5   Mucus PRESENT    No results found.  MAU Course  Procedures  MDM -preeclampsia evaluation without severe range BP in MAU on admission -symptoms include: HA; Tylenol and Flexeril given -UA: cloudy/SG 1.032/>/=500GLU/20ketones/100PRO/few bacteria -CBC: H/H 10.1/30.2, platelets 244 -CMP: serum creatinine 0.92, AST/ALT 23/14 -PCr: 0.09 -EFM: reactive       -baseline: 145 +~6925min period of tachycardia       -variability: moderate       -accels: present, 15x15       -decels: variable @2008        -TOCO: irritability -after medication administration, pt reports HA now 3-4/10 -WBCs and glucose predictably elevated s/p BMZ  -DFM since last night with return to normal  movement upon entry to MAU -given variable deceleration and tachycardia, BPP ordered -BPP ordered, 8/8  -pt watched for additional hour with normal tracing -EFM: reactive       -baseline: 130       -variability: moderate       -accels: present, 15x15       -decels: absent       -TOCO: quiet  -consulted with Dr. Earlene Plater, pt can be discharged home with significantly reduced HA and additional hour of normal tracing and 8/8 BPP -called and notified Dr. Senaida Ores to recommend f/u BP in office given recent change in  medication and some elevated BPs in MAU tonight, per Dr. Senaida Ores, pt can call office at 830AM on Monday to schedule appt -pt discharged to home in stable condition  Orders Placed This Encounter  Procedures  . [place] MFM FETAL BPP WO NON STRESS    Standing Status:   Standing    Number of Occurrences:   1    Order Specific Question:   Symptom/Reason for Exam    Answer:   Variable fetal heart rate decelerations, antepartum [[id number]]  . CBC    Standing Status:   Standing    Number of Occurrences:   1  . Comprehensive metabolic panel    Standing Status:   Standing    Number of Occurrences:   1  . Protein / creatinine ratio, urine    Standing Status:   Standing    Number of Occurrences:   1  . Urinalysis, Routine w reflex microscopic Urine, Clean Catch    Standing Status:   Standing    Number of Occurrences:   1  . AMB referral to headache clinic    Referral Priority:   Urgent    Referral Type:   Consultation    Referred to Provider:   Glyn Ade, Scot Jun, PA-C    Number of Visits Requested:   1  . Discharge patient    Order Specific Question:   Discharge disposition    Answer:   01-Home or Self Care [1]    Order Specific Question:   Discharge patient date    Answer:   08/18/2020   Meds ordered this encounter  Medications  . betamethasone acetate-betamethasone sodium phosphate (CELESTONE) injection 12 mg  . acetaminophen (TYLENOL) tablet 1,000 mg  . cyclobenzaprine (FLEXERIL) tablet 10 mg    Assessment and Plan   1. Chronic hypertension affecting pregnancy   2. Variable fetal heart rate decelerations, antepartum   3. [redacted] weeks gestation of pregnancy   4. NST (non-stress test) reactive   5. Pregnancy headache in third trimester     Allergies as of 08/18/2020   No Known Allergies     Medication List    TAKE these medications   acetaminophen 650 MG CR tablet Commonly known as: TYLENOL Take 650 mg by mouth daily as needed for pain.   diphenhydramine-acetaminophen  25-500 MG Tabs tablet Commonly known as: TYLENOL PM Take 2 tablets by mouth at bedtime.   folic acid 800 MCG tablet Commonly known as: FOLVITE Take 800 mcg by mouth daily.   hydrOXYzine 25 MG capsule Commonly known as: Vistaril Take 1 capsule (25 mg total) by mouth at bedtime and may repeat dose one time if needed.   NIFEdipine 60 MG 24 hr tablet Commonly known as: PROCARDIA XL/NIFEDICAL XL Take 1 tablet (60 mg total) by mouth daily.   PRENATAL PO Take 1 tablet by mouth daily.   pyridOXINE 100 MG  tablet Commonly known as: VITAMIN B-6 Take 100 mg by mouth daily.       -pt advised to call clinic at 830AM on Monday 08/21/2020 for BP check -referral to HA clinic -Reviewed warning blood pressure values (systolic = / > 140 and/or diastolic =/> 90 and severe range pressures 160/110). Explained that, if blood pressure is elevated, she should sit down, rest, and eat/drink something. If still elevated 15 minutes later, and she is greater than 20 weeks, she should call clinic or come to MAU. She should come to MAU if she has elevated pressures and any of the following:  - headache not relieved with tylenol, rest, hydration -blurry vision, floating spots in her vision - sudden full-body edema or facial edema -RUQ pain that is constant. These symptoms may indicate that her blood pressure is worsening and she may be developing gestational hypertension or pre-eclampsia, which is an emergency.  -return MAU precautions given -pt discharged to home in stable condition  Odie Sera Mandolin Falwell 08/18/2020, 10:38 PM

## 2020-08-18 NOTE — Discharge Instructions (Signed)
Preeclampsia and Eclampsia Preeclampsia is a serious condition that may develop during pregnancy. This condition causes high blood pressure and increased protein in your urine along with other symptoms, such as headaches and vision changes. These symptoms may develop as the condition gets worse. Preeclampsia may occur at 20 weeks of pregnancy or later. Diagnosing and treating preeclampsia early is very important. If not treated early, it can cause serious problems for you and your baby. One problem it can lead to is eclampsia. Eclampsia is a condition that causes muscle jerking or shaking (convulsions or seizures) and other serious problems for the mother. During pregnancy, delivering your baby may be the best treatment for preeclampsia or eclampsia. For most women, preeclampsia and eclampsia symptoms go away after giving birth. In rare cases, a woman may develop preeclampsia after giving birth (postpartum preeclampsia). This usually occurs within 48 hours after childbirth but may occur up to 6 weeks after giving birth. What are the causes? The cause of preeclampsia is not known. What increases the risk? The following risk factors make you more likely to develop preeclampsia:  Being pregnant for the first time.  Having had preeclampsia during a past pregnancy.  Having a family history of preeclampsia.  Having high blood pressure.  Being pregnant with more than one baby.  Being 35 or older.  Being African-American.  Having kidney disease or diabetes.  Having medical conditions such as lupus or blood diseases.  Being very overweight (obese). What are the signs or symptoms? The most common symptoms are:  Severe headaches.  Vision problems, such as blurred or double vision.  Abdominal pain, especially upper abdominal pain. Other symptoms that may develop as the condition gets worse include:  Sudden weight gain.  Sudden swelling of the hands, face, legs, and feet.  Severe nausea  and vomiting.  Numbness in the face, arms, legs, and feet.  Dizziness.  Urinating less than usual.  Slurred speech.  Convulsions or seizures. How is this diagnosed? There are no screening tests for preeclampsia. Your health care provider will ask you about symptoms and check for signs of preeclampsia during your prenatal visits. You may also have tests that include:  Checking your blood pressure.  Urine tests to check for protein. Your health care provider will check for this at every prenatal visit.  Blood tests.  Monitoring your baby's heart rate.  Ultrasound. How is this treated? You and your health care provider will determine the treatment approach that is best for you. Treatment may include:  Having more frequent prenatal exams to check for signs of preeclampsia, if you have an increased risk for preeclampsia.  Medicine to lower your blood pressure.  Staying in the hospital, if your condition is severe. There, treatment will focus on controlling your blood pressure and the amount of fluids in your body (fluid retention).  Taking medicine (magnesium sulfate) to prevent seizures. This may be given as an injection or through an IV.  Taking a low-dose aspirin during your pregnancy.  Delivering your baby early. You may have your labor started with medicine (induced), or you may have a cesarean delivery. Follow these instructions at home: Eating and drinking   Drink enough fluid to keep your urine pale yellow.  Avoid caffeine. Lifestyle  Do not use any products that contain nicotine or tobacco, such as cigarettes and e-cigarettes. If you need help quitting, ask your health care provider.  Do not use alcohol or drugs.  Avoid stress as much as possible. Rest and get   plenty of sleep. General instructions  Take over-the-counter and prescription medicines only as told by your health care provider.  When lying down, lie on your left side. This keeps pressure off your  major blood vessels.  When sitting or lying down, raise (elevate) your feet. Try putting some pillows underneath your lower legs.  Exercise regularly. Ask your health care provider what kinds of exercise are best for you.  Keep all follow-up and prenatal visits as told by your health care provider. This is important. How is this prevented? There is no known way of preventing preeclampsia or eclampsia from developing. However, to lower your risk of complications and detect problems early:  Get regular prenatal care. Your health care provider may be able to diagnose and treat the condition early.  Maintain a healthy weight. Ask your health care provider for help managing weight gain during pregnancy.  Work with your health care provider to manage any long-term (chronic) health conditions you have, such as diabetes or kidney problems.  You may have tests of your blood pressure and kidney function after giving birth.  Your health care provider may have you take low-dose aspirin during your next pregnancy. Contact a health care provider if:  You have symptoms that your health care provider told you may require more treatment or monitoring, such as: ? Headaches. ? Nausea or vomiting. ? Abdominal pain. ? Dizziness. ? Light-headedness. Get help right away if:  You have severe: ? Abdominal pain. ? Headaches that do not get better. ? Dizziness. ? Vision problems. ? Confusion. ? Nausea or vomiting.  You have any of the following: ? A seizure. ? Sudden, rapid weight gain. ? Sudden swelling in your hands, ankles, or face. ? Trouble moving any part of your body. ? Numbness in any part of your body. ? Trouble speaking. ? Abnormal bleeding.  You faint. Summary  Preeclampsia is a serious condition that may develop during pregnancy.  This condition causes high blood pressure and increased protein in your urine along with other symptoms, such as headaches and vision  changes.  Diagnosing and treating preeclampsia early is very important. If not treated early, it can cause serious problems for you and your baby.  Get help right away if you have symptoms that your health care provider told you to watch for. This information is not intended to replace advice given to you by your health care provider. Make sure you discuss any questions you have with your health care provider. Document Revised: 05/26/2018 Document Reviewed: 04/29/2016 Elsevier Patient Education  2020 [employer]        Preterm Labor and Birth Information  The normal length of a pregnancy is 39-41 weeks. Preterm labor is when labor starts before 37 completed weeks of pregnancy. What are the risk factors for preterm labor? Preterm labor is more likely to occur in women who:  Have certain infections during pregnancy such as a bladder infection, sexually transmitted infection, or infection inside the uterus (chorioamnionitis).  Have a shorter-than-normal cervix.  Have gone into preterm labor before.  Have had surgery on their cervix.  Are younger than age 17 or older than age 35.  Are African American.  Are pregnant with twins or multiple babies (multiple gestation).  Take street drugs or smoke while pregnant.  Do not gain enough weight while pregnant.  Became pregnant shortly after having been pregnant. What are the symptoms of preterm labor? Symptoms of preterm labor include:  Cramps similar to those that can happen   during a menstrual period. The cramps may happen with diarrhea.  Pain in the abdomen or lower back.  Regular uterine contractions that may feel like tightening of the abdomen.  A feeling of increased pressure in the pelvis.  Increased watery or bloody mucus discharge from the vagina.  Water breaking (ruptured amniotic sac). Why is it important to recognize signs of preterm labor? It is important to recognize signs of preterm labor because babies who  are born prematurely may not be fully developed. This can put them at an increased risk for:  Long-term (chronic) heart and lung problems.  Difficulty immediately after birth with regulating body systems, including blood sugar, body temperature, heart rate, and breathing rate.  Bleeding in the brain.  Cerebral palsy.  Learning difficulties.  Death. These risks are highest for babies who are born before 34 weeks of pregnancy. How is preterm labor treated? Treatment depends on the length of your pregnancy, your condition, and the health of your baby. It may involve:  Having a stitch (suture) placed in your cervix to prevent your cervix from opening too early (cerclage).  Taking or being given medicines, such as: ? Hormone medicines. These may be given early in pregnancy to help support the pregnancy. ? Medicine to stop contractions. ? Medicines to help mature the baby's lungs. These may be prescribed if the risk of delivery is high. ? Medicines to prevent your baby from developing cerebral palsy. If the labor happens before 34 weeks of pregnancy, you may need to stay in the hospital. What should I do if I think I am in preterm labor? If you think that you are going into preterm labor, call your health care provider right away. How can I prevent preterm labor in future pregnancies? To increase your chance of having a full-term pregnancy:  Do not use any tobacco products, such as cigarettes, chewing tobacco, and e-cigarettes. If you need help quitting, ask your health care provider.  Do not use street drugs or medicines that have not been prescribed to you during your pregnancy.  Talk with your health care provider before taking any herbal supplements, even if you have been taking them regularly.  Make sure you gain a healthy amount of weight during your pregnancy.  Watch for infection. If you think that you might have an infection, get it checked right away.  Make sure to tell  your health care provider if you have gone into preterm labor before. This information is not intended to replace advice given to you by your health care provider. Make sure you discuss any questions you have with your health care provider. Document Revised: 01/15/2019 Document Reviewed: 02/14/2016 Elsevier Patient Education  2020 [employer] Inc.  

## 2020-08-22 ENCOUNTER — Encounter (HOSPITAL_COMMUNITY): Payer: Self-pay | Admitting: Obstetrics and Gynecology

## 2020-08-22 ENCOUNTER — Inpatient Hospital Stay (HOSPITAL_COMMUNITY)
Admission: AD | Admit: 2020-08-22 | Discharge: 2020-08-22 | Disposition: A | Discharge status: Home / Self Care | Payer: 59 | Attending: Obstetrics and Gynecology | Admitting: Obstetrics and Gynecology

## 2020-08-22 ENCOUNTER — Other Ambulatory Visit: Payer: Self-pay

## 2020-08-22 DIAGNOSIS — O10913 Unspecified pre-existing hypertension complicating pregnancy, third trimester: Secondary | ICD-10-CM | POA: Insufficient documentation

## 2020-08-22 DIAGNOSIS — O09523 Supervision of elderly multigravida, third trimester: Secondary | ICD-10-CM | POA: Insufficient documentation

## 2020-08-22 DIAGNOSIS — Z3A36 36 weeks gestation of pregnancy: Secondary | ICD-10-CM | POA: Insufficient documentation

## 2020-08-22 DIAGNOSIS — O10919 Unspecified pre-existing hypertension complicating pregnancy, unspecified trimester: Secondary | ICD-10-CM

## 2020-08-22 LAB — COMPREHENSIVE METABOLIC PANEL
ALT: 12 U/L (ref 0–44)
AST: 12 U/L — ABNORMAL LOW (ref 15–41)
Albumin: 2.8 g/dL — ABNORMAL LOW (ref 3.5–5.0)
Alkaline Phosphatase: 122 U/L (ref 38–126)
Anion gap: 11 (ref 5–15)
BUN: 6 mg/dL (ref 6–20)
CO2: 20 mmol/L — ABNORMAL LOW (ref 22–32)
Calcium: 9.4 mg/dL (ref 8.9–10.3)
Chloride: 100 mmol/L (ref 98–111)
Creatinine, Ser: 0.87 mg/dL (ref 0.44–1.00)
GFR, Estimated: >60 mL/min (ref >60)
Glucose, Bld: 136 mg/dL — ABNORMAL HIGH (ref 70–99)
Potassium: 3.4 mmol/L — ABNORMAL LOW (ref 3.5–5.1)
Sodium: 131 mmol/L — ABNORMAL LOW (ref 135–145)
Total Bilirubin: 0.2 mg/dL — ABNORMAL LOW (ref 0.3–1.2)
Total Protein: 6.4 g/dL — ABNORMAL LOW (ref 6.5–8.1)

## 2020-08-22 LAB — CBC
HCT: 32.4 % — ABNORMAL LOW (ref 36.0–46.0)
Hemoglobin: 11 g/dL — ABNORMAL LOW (ref 12.0–15.0)
MCH: 31.4 pg (ref 26.0–34.0)
MCHC: 34 g/dL (ref 30.0–36.0)
MCV: 92.6 fL (ref 80.0–100.0)
Platelets: 276 10*3/uL (ref 150–400)
RBC: 3.5 MIL/uL — ABNORMAL LOW (ref 3.87–5.11)
RDW: 13.7 % (ref 11.5–15.5)
WBC: 10.8 10*3/uL — ABNORMAL HIGH (ref 4.0–10.5)
nRBC: 0 % (ref 0.0–0.2)

## 2020-08-22 LAB — PROTEIN / CREATININE RATIO, URINE
Creatinine, Urine: 372.43 mg/dL
Protein Creatinine Ratio: 0.1 mg/mg{Cre} (ref 0.00–0.15)
Total Protein, Urine: 37 mg/dL

## 2020-08-22 NOTE — MAU Provider Note (Signed)
Chief Complaint:  Hypertension   First Provider Initiated Contact with Patient 08/22/20 2048     HPI: Angel Clayton is a 39 y.o. G3P0010 at 92w6dwho presents to maternity admissions reporting elevated blood pressure over the weekend and today.  Was told to come here and get evaluated.  . She reports good fetal movement, denies LOF, vaginal bleeding, vaginal itching/burning, urinary symptoms, h/a, dizziness, n/v, diarrhea, constipation or fever/chills.  She denies headache, visual changes or RUQ abdominal pain.  Hypertension This is a recurrent problem. The current episode started in the past 7 days. The problem is unchanged. Pertinent negatives include no anxiety, blurred vision, chest pain, headaches, peripheral edema or shortness of breath. There are no associated agents to hypertension. Treatments tried: Nifedipine 60mg . The current treatment provides mild improvement. There are no compliance problems.     RN Note: Pt here on Thursday and Friday for Elevated B/P. Told to watch over the weekend and let provider know.Pt reports she had a high b/p over the weekend( emailed results to office ) did not see results until today and told to come in and get checked again. Denies any headache or visual changes. Good fetal movement felt.  Pt takes Procardia 60mg .  Past Medical History: Past Medical History:  Diagnosis Date  . ALLERGIC RHINITIS 07/11/2009  . Hx of breast reduction, elective   . Hypertension   . OBESITY 05/16/2007  . Papilledema of both eyes 09/2018  . Pilonidal cyst     Past obstetric history: OB History  Gravida Para Term Preterm AB Living  3 1     1     SAB TAB Ectopic Multiple Live Births  0 0 1 0      # Outcome Date GA Lbr Len/2nd Weight Sex Delivery Anes PTL Lv  3 Current           2 Para 05/17/19 [redacted]w[redacted]d 10:50 119 g F Vag-Spont None  FD     Birth Comments: 16 2/7  1 Ectopic             Obstetric Comments  Row 2 not a "para", due to gestation, unable to change     Past Surgical History: Past Surgical History:  Procedure Laterality Date  . BREAST REDUCTION SURGERY  2005  . LAPAROSCOPIC GASTRIC SLEEVE RESECTION  2016  . LAPAROSCOPY N/A 09/21/2019   Procedure: LAPAROSCOPY DIAGNOSTIC, removal of ectopic, PARTIAL LEFT OOPHERECTOMY;  Surgeon: 2006, DO;  Location: [facility] OR;  Service: Gynecology;  Laterality: N/A;  . UPPER GI ENDOSCOPY  09/06/2015  . WISDOM TOOTH EXTRACTION  09/2019    Family History: Family History  Problem Relation Age of Onset  . Hypertension Mother   . Pseudotumor cerebri Mother   . Hypertension Father     Social History: Social History   Tobacco Use  . Smoking status: Never Smoker  . Smokeless tobacco: Never Used  Vaping Use  . Vaping Use: Never used  Substance Use Topics  . Alcohol use: Not Currently  . Drug use: Never    Allergies: No Known Allergies  Meds:  Medications Prior to Admission  Medication Sig Dispense Refill Last Dose  . ferrous sulfate 325 (65 FE) MG EC tablet Take 325 mg by mouth 3 (three) times daily with meals.   08/22/2020 at Unknown time  . NIFEdipine (PROCARDIA XL/NIFEDICAL XL) 60 MG 24 hr tablet Take 1 tablet (60 mg total) by mouth daily. 30 tablet 2 08/22/2020 at 0930  . Prenatal Vit-Fe Fumarate-FA (PRENATAL  PO) Take 1 tablet by mouth daily.   08/22/2020 at Unknown time  . acetaminophen (TYLENOL) 650 MG CR tablet Take 650 mg by mouth daily as needed for pain.     . diphenhydramine-acetaminophen (TYLENOL PM) 25-500 MG TABS tablet Take 2 tablets by mouth at bedtime.     . folic acid (FOLVITE) 800 MCG tablet Take 800 mcg by mouth daily.     . hydrOXYzine (VISTARIL) 25 MG capsule Take 1 capsule (25 mg total) by mouth at bedtime and may repeat dose one time if needed. 30 capsule 0   . pyridOXINE (VITAMIN B-6) 100 MG tablet Take 100 mg by mouth daily.       I have reviewed patient's Past Medical Hx, Surgical Hx, Family Hx, Social Hx, medications and allergies.   ROS:  Review  of Systems  Eyes: Negative for blurred vision.  Respiratory: Negative for shortness of breath.   Cardiovascular: Negative for chest pain.  Neurological: Negative for headaches.   Other systems negative  Physical Exam   Patient Vitals for the past 24 hrs:  BP Temp Pulse Resp Weight  08/22/20 2048 (!) 146/82 -- (!) 104 -- --  08/22/20 2024 (!) 150/81 98.5 F (36.9 C) (!) 113 18 119.7 kg   Vitals:   08/22/20 2024 08/22/20 2048 08/22/20 2102 08/22/20 2117  BP: (!) 150/81 (!) 146/82 138/79 (!) 142/95  Pulse: (!) 113 (!) 104 (!) 106 100  Resp: 18     Temp: 98.5 F (36.9 C)     Weight: 119.7 kg      Vitals:   08/22/20 2216 08/22/20 2232 08/22/20 2246 08/22/20 2247  BP: 138/89 137/84  128/84  Pulse: 95 (!) 106  (!) 102  Resp:      Temp:      SpO2: 99%  99%   Weight:        Constitutional: Well-developed, well-nourished female in no acute distress.  Cardiovascular: normal rate and rhythm Respiratory: normal effort, clear to auscultation bilaterally GI: Abd soft, non-tender, gravid appropriate for gestational age.   No rebound or guarding. MS: Extremities nontender, Tr-1+ pedal edema, normal ROM Neurologic: Alert and oriented x 4.  DTRs 2+ with no clonus GU: Neg CVAT.  PELVIC EXAM:  deferred  FHT:  Baseline 150 , moderate variability, accelerations present, no decelerations Contractions: Occasional    Labs: --/--/B POS (12/15 1050) Results for orders placed or performed during the hospital encounter of 08/22/20 (from the past 24 hour(s))  CBC     Status: Abnormal   Collection Time: 08/22/20  8:52 PM  Result Value Ref Range   WBC 10.8 (H) 4.0 - 10.5 K/uL   RBC 3.50 (L) 3.87 - 5.11 MIL/uL   Hemoglobin 11.0 (L) 12.0 - 15.0 g/dL   HCT 58.8 (L) 36 - 46 %   MCV 92.6 80.0 - 100.0 fL   MCH 31.4 26.0 - 34.0 pg   MCHC 34.0 30.0 - 36.0 g/dL   RDW 50.2 77.4 - 12.8 %   Platelets 276 150 - 400 K/uL   nRBC 0.0 0.0 - 0.2 %  Comprehensive metabolic panel     Status: Abnormal    Collection Time: 08/22/20  8:52 PM  Result Value Ref Range   Sodium 131 (L) 135 - 145 mmol/L   Potassium 3.4 (L) 3.5 - 5.1 mmol/L   Chloride 100 98 - 111 mmol/L   CO2 20 (L) 22 - 32 mmol/L   Glucose, Bld 136 (H) 70 - 99 mg/dL  BUN 6 6 - 20 mg/dL   Creatinine, Ser 8.41 0.44 - 1.00 mg/dL   Calcium 9.4 8.9 - 32.4 mg/dL   Total Protein 6.4 (L) 6.5 - 8.1 g/dL   Albumin 2.8 (L) 3.5 - 5.0 g/dL   AST 12 (L) 15 - 41 U/L   ALT 12 0 - 44 U/L   Alkaline Phosphatase 122 38 - 126 U/L   Total Bilirubin 0.2 (L) 0.3 - 1.2 mg/dL   GFR, Estimated >40 >10 mL/min   Anion gap 11 5 - 15  Protein / creatinine ratio, urine     Status: None   Collection Time: 08/22/20  8:59 PM  Result Value Ref Range   Creatinine, Urine 372.43 mg/dL   Total Protein, Urine 37 mg/dL   Protein Creatinine Ratio 0.10 0.00 - 0.15 mg/mg[Cre]    Imaging:    MAU Course/MDM: I have ordered labs and reviewed results. These are normal    BPs have improved while here.  None are in severe range. Several are just mildly elevated. NST reviewed, reactive, category I  Treatments in MAU included EFM.    Assessment: Single IUP at [redacted]w[redacted]d Chronic Hypertension in pregnancy  Plan: Discharge home Preeclampsia precautions Labor precautions and fetal kick counts Follow up in Office for prenatal visits and recheck of BP (has appt Friday)  Encouraged to return here if she develops worsening of symptoms, increase in pain, fever, or other concerning symptoms.   Pt stable at time of discharge.  Wynelle Bourgeois CNM, MSN Certified Nurse-Midwife 08/22/2020 8:49 PM

## 2020-08-22 NOTE — Discharge Instructions (Signed)

## 2020-08-22 NOTE — MAU Note (Signed)
Pt here on Thursday and Friday for Elevated B/P. Told to watch over the weekend and let provider know.Pt reports she had a high b/p over the weekend( emailed results to office ) did not see results until today and told to come in and get checked again. Denies any headache or visual changes. Good fetal movement felt.  Pt takes Procardia 60mg .

## 2020-08-26 ENCOUNTER — Other Ambulatory Visit: Payer: Self-pay

## 2020-08-26 ENCOUNTER — Observation Stay (HOSPITAL_COMMUNITY)
Admission: AD | Admit: 2020-08-26 | Discharge: 2020-08-27 | Disposition: A | Discharge status: Home / Self Care | Payer: 59 | Attending: Obstetrics and Gynecology | Admitting: Obstetrics and Gynecology

## 2020-08-26 ENCOUNTER — Encounter (HOSPITAL_COMMUNITY): Payer: Self-pay | Admitting: Obstetrics and Gynecology

## 2020-08-26 ENCOUNTER — Other Ambulatory Visit (HOSPITAL_COMMUNITY): Admission: RE | Admit: 2020-08-26 | Payer: 59 | Source: Home / Self Care

## 2020-08-26 ENCOUNTER — Inpatient Hospital Stay (HOSPITAL_BASED_OUTPATIENT_CLINIC_OR_DEPARTMENT_OTHER): Payer: 59

## 2020-08-26 DIAGNOSIS — O99213 Obesity complicating pregnancy, third trimester: Secondary | ICD-10-CM

## 2020-08-26 DIAGNOSIS — Z3A36 36 weeks gestation of pregnancy: Secondary | ICD-10-CM | POA: Insufficient documentation

## 2020-08-26 DIAGNOSIS — O10919 Unspecified pre-existing hypertension complicating pregnancy, unspecified trimester: Secondary | ICD-10-CM

## 2020-08-26 DIAGNOSIS — O10913 Unspecified pre-existing hypertension complicating pregnancy, third trimester: Secondary | ICD-10-CM | POA: Diagnosis not present

## 2020-08-26 DIAGNOSIS — E669 Obesity, unspecified: Secondary | ICD-10-CM

## 2020-08-26 DIAGNOSIS — O133 Gestational [pregnancy-induced] hypertension without significant proteinuria, third trimester: Principal | ICD-10-CM | POA: Insufficient documentation

## 2020-08-26 DIAGNOSIS — O10013 Pre-existing essential hypertension complicating pregnancy, third trimester: Secondary | ICD-10-CM | POA: Diagnosis not present

## 2020-08-26 DIAGNOSIS — O368390 Maternal care for abnormalities of the fetal heart rate or rhythm, unspecified trimester, not applicable or unspecified: Secondary | ICD-10-CM | POA: Diagnosis present

## 2020-08-26 DIAGNOSIS — Z20822 Contact with and (suspected) exposure to covid-19: Secondary | ICD-10-CM | POA: Insufficient documentation

## 2020-08-26 DIAGNOSIS — IMO0002 Reserved for concepts with insufficient information to code with codable children: Secondary | ICD-10-CM

## 2020-08-26 DIAGNOSIS — O368330 Maternal care for abnormalities of the fetal heart rate or rhythm, third trimester, not applicable or unspecified: Secondary | ICD-10-CM | POA: Diagnosis not present

## 2020-08-26 HISTORY — DX: Headache, unspecified: R51.9

## 2020-08-26 HISTORY — DX: Anemia, unspecified: D64.9

## 2020-08-26 LAB — URINALYSIS, ROUTINE W REFLEX MICROSCOPIC
Bilirubin Urine: NEGATIVE
Glucose, UA: NEGATIVE mg/dL
Hgb urine dipstick: NEGATIVE
Ketones, ur: NEGATIVE mg/dL
Nitrite: NEGATIVE
Protein, ur: 100 mg/dL — AB
Specific Gravity, Urine: 1.028 (ref 1.005–1.030)
pH: 5 (ref 5.0–8.0)

## 2020-08-26 LAB — COMPREHENSIVE METABOLIC PANEL
ALT: 10 U/L (ref 0–44)
AST: 16 U/L (ref 15–41)
Albumin: 2.6 g/dL — ABNORMAL LOW (ref 3.5–5.0)
Alkaline Phosphatase: 122 U/L (ref 38–126)
Anion gap: 7 (ref 5–15)
BUN: 10 mg/dL (ref 6–20)
CO2: 21 mmol/L — ABNORMAL LOW (ref 22–32)
Calcium: 8.7 mg/dL — ABNORMAL LOW (ref 8.9–10.3)
Chloride: 107 mmol/L (ref 98–111)
Creatinine, Ser: 0.83 mg/dL (ref 0.44–1.00)
GFR, Estimated: >60 mL/min (ref >60)
Glucose, Bld: 136 mg/dL — ABNORMAL HIGH (ref 70–99)
Potassium: 4 mmol/L (ref 3.5–5.1)
Sodium: 135 mmol/L (ref 135–145)
Total Bilirubin: 0.2 mg/dL — ABNORMAL LOW (ref 0.3–1.2)
Total Protein: 6.1 g/dL — ABNORMAL LOW (ref 6.5–8.1)

## 2020-08-26 LAB — TYPE AND SCREEN
ABO/RH(D): B POS
Antibody Screen: NEGATIVE

## 2020-08-26 LAB — CBC
HCT: 31.8 % — ABNORMAL LOW (ref 36.0–46.0)
Hemoglobin: 10.7 g/dL — ABNORMAL LOW (ref 12.0–15.0)
MCH: 31.5 pg (ref 26.0–34.0)
MCHC: 33.6 g/dL (ref 30.0–36.0)
MCV: 93.5 fL (ref 80.0–100.0)
Platelets: 264 10*3/uL (ref 150–400)
RBC: 3.4 MIL/uL — ABNORMAL LOW (ref 3.87–5.11)
RDW: 13.6 % (ref 11.5–15.5)
WBC: 7.3 10*3/uL (ref 4.0–10.5)
nRBC: 0 % (ref 0.0–0.2)

## 2020-08-26 LAB — PROTEIN / CREATININE RATIO, URINE
Creatinine, Urine: 284.08 mg/dL
Protein Creatinine Ratio: 0.11 mg/mg{Cre} (ref 0.00–0.15)
Total Protein, Urine: 32 mg/dL

## 2020-08-26 MED ORDER — ZOLPIDEM TARTRATE 5 MG PO TABS: 5.0000 mg | ORAL_TABLET | Freq: Every evening | ORAL | Status: DC | PRN

## 2020-08-26 MED ORDER — NIFEDIPINE ER OSMOTIC RELEASE 30 MG PO TB24
60.0000 mg | ORAL_TABLET | Freq: Every day | ORAL | Status: DC
  Administered 2020-08-27: 60 mg via ORAL
  Filled 2020-08-26: qty 2

## 2020-08-26 MED ORDER — PRENATAL MULTIVITAMIN CH
1.0000 | ORAL_TABLET | Freq: Every day | ORAL | Status: DC
  Administered 2020-08-27: 1 via ORAL
  Filled 2020-08-26: qty 1

## 2020-08-26 MED ORDER — CALCIUM CARBONATE ANTACID 500 MG PO CHEW: 2.0000 | CHEWABLE_TABLET | ORAL | Status: DC | PRN

## 2020-08-26 MED ORDER — DOCUSATE SODIUM 100 MG PO CAPS
100.0000 mg | ORAL_CAPSULE | Freq: Every day | ORAL | Status: DC
  Administered 2020-08-27: 100 mg via ORAL
  Filled 2020-08-26: qty 1

## 2020-08-26 MED ORDER — ACETAMINOPHEN 500 MG PO TABS
1000.0000 mg | ORAL_TABLET | Freq: Once | ORAL | Status: AC
  Administered 2020-08-26: 1000 mg via ORAL
  Filled 2020-08-26: qty 2

## 2020-08-26 MED ORDER — ACETAMINOPHEN 325 MG PO TABS: 650.0000 mg | ORAL_TABLET | ORAL | Status: DC | PRN

## 2020-08-26 NOTE — MAU Provider Note (Signed)
History     CSN: [id number]  Arrival date and time: 08/26/20 1359   First Provider Initiated Contact with Patient 08/26/20 1601      Chief Complaint  Patient presents with  . Hypertension   Angel Clayton is a 39 y.o. G3P0010 at [redacted]w[redacted]d who presents to MAU for preeclampsia evaluation after she was seen in the office yesterday and had a severe range pressure and was told to come to MAU for evaluation. Patient reports after her OB visit yesterday she had an job interview and then was tired after that and did not come to the hospital until today. Patient reports Dr. Mindi Slicker was going to put in orders for her and the plan is to have her delivered at 37 weeks.  Pt denies HA, blurry vision/seeing spots, N/V, epigastric pain, swelling in face and hands, sudden weight gain. Pt denies chest pain and SOB.  Pt denies constipation, diarrhea, or urinary problems. Pt denies fever, chills, fatigue, sweating or changes in appetite. Pt denies dizziness, light-headedness, weakness.  Pt denies VB, ctx, LOF and reports good FM.   OB History    Gravida  3   Para  1   Term      Preterm  0   AB  1   Living  0     SAB  0   TAB  0   Ectopic  1   Multiple  0   Live Births  0        Obstetric Comments  Row 2 not a "para", due to gestation, unable to change        Past Medical History:  Diagnosis Date  . ALLERGIC RHINITIS 07/11/2009  . Anemia   . Headache    x 4 years-saw provider at [facility]  . Hx of breast reduction, elective   . Hypertension   . OBESITY 05/16/2007  . Papilledema of both eyes 09/2018  . Pilonidal cyst     Past Surgical History:  Procedure Laterality Date  . BREAST REDUCTION SURGERY  2005  . LAPAROSCOPIC GASTRIC SLEEVE RESECTION  2016  . LAPAROSCOPY N/A 09/21/2019   Procedure: LAPAROSCOPY DIAGNOSTIC, removal of ectopic, PARTIAL LEFT OOPHERECTOMY;  Surgeon: Edwinna Areola, DO;  Location: [facility] OR;  Service: Gynecology;  Laterality: N/A;  . UPPER GI  ENDOSCOPY  09/06/2015  . WISDOM TOOTH EXTRACTION  09/2019    Family History  Problem Relation Age of Onset  . Hypertension Mother   . Pseudotumor cerebri Mother   . Hypertension Father     Social History   Tobacco Use  . Smoking status: Never Smoker  . Smokeless tobacco: Never Used  Vaping Use  . Vaping Use: Never used  Substance Use Topics  . Alcohol use: Not Currently  . Drug use: Never    Allergies: No Known Allergies  Medications Prior to Admission  Medication Sig Dispense Refill Last Dose  . acetaminophen (TYLENOL) 650 MG CR tablet Take 1,500 mg by mouth once.    08/25/2020 at 0000  . ferrous sulfate 325 (65 FE) MG EC tablet Take 325 mg by mouth 3 (three) times daily with meals.   08/26/2020 at 1000  . NIFEdipine (PROCARDIA XL/NIFEDICAL XL) 60 MG 24 hr tablet Take 1 tablet (60 mg total) by mouth daily. 30 tablet 2 08/26/2020 at 1000  . Prenatal Vit-Fe Fumarate-FA (PRENATAL PO) Take 1 tablet by mouth daily.   08/26/2020 at Unknown time  . diphenhydramine-acetaminophen (TYLENOL PM) 25-500 MG TABS tablet Take 2  tablets by mouth at bedtime.     . folic acid (FOLVITE) 800 MCG tablet Take 800 mcg by mouth daily.     . hydrOXYzine (VISTARIL) 25 MG capsule Take 1 capsule (25 mg total) by mouth at bedtime and may repeat dose one time if needed. 30 capsule 0   . pyridOXINE (VITAMIN B-6) 100 MG tablet Take 100 mg by mouth daily.       Review of Systems  Constitutional: Negative for chills, diaphoresis, fatigue and fever.  Eyes: Negative for visual disturbance.  Respiratory: Negative for shortness of breath.   Cardiovascular: Negative for chest pain.  Gastrointestinal: Negative for abdominal pain, constipation, diarrhea, nausea and vomiting.  Genitourinary: Negative for dysuria, flank pain, frequency, pelvic pain, urgency, vaginal bleeding and vaginal discharge.  Neurological: Negative for dizziness, weakness, light-headedness and headaches.   Physical Exa oreDEID_[id number]$5\' 6" 12/15/2019, SpO2 100 %.  Patient Vitals for the past 24 hrs:  BP Temp Pulse Resp SpO2 Height Weight  08/26/20 1817 114/62 -- 91 17 -- -- --  08/26/20 1812 114/67 -- 88 18 -- -- --  08/26/20 1746 -- -- -- -- 100 % -- --  08/26/20 1741 -- -- -- -- 99 % -- --  08/26/20 1736 -- -- -- -- 99 % -- --  08/26/20 1732 129/79 -- 94 -- -- -- --  08/26/20 1701 98/60 -- 92 -- -- -- --  08/26/20 1700 -- -- -- -- 98 % -- --  08/26/20 1646 -- -- -- -- 99 % -- --  08/26/20 1641 -- -- -- -- 98 % -- --  08/26/20 1636 -- -- -- -- 99 % -- --  08/26/20 1632 127/80 -- (!) 105 -- -- -- --  08/26/20 1631 -- -- -- -- 98 % -- --  08/26/20 1626 -- -- -- -- 99 % -- --  08/26/20 1621 -- -- -- -- 98 % -- --  08/26/20 1616 -- -- -- -- 98 % -- --  08/26/20 1611 -- -- -- -- 100 % -- --  08/26/20 1606 -- -- -- -- 99 % -- --  08/26/20 1601 -- -- -- -- 100 % -- --  08/26/20 1556 -- -- -- -- 99 % -- --  08/26/20 1551 -- -- -- -- 98 % -- --  08/26/20 1546 -- -- -- -- 99 % -- --  08/26/20 1541 -- -- -- -- 98 % -- --  08/26/20 1536 -- -- -- -- 99 % -- --  08/26/20 1532 136/81 -- (!) 110 17 -- -- --  08/26/20 1531 -- -- -- -- 99 % -- --  08/26/20 1526 -- -- -- -- 99 % -- --  08/26/20 1521 -- -- -- -- 98 % -- --  08/26/20 1516 -- -- -- -- 99 % -- --  08/26/20 1512 135/82 -- (!) 109 -- --  (1.676 m) 119.7 kg  08/26/20 1436 (!) 155/84 98.4 F (36.9 C) (!) 105 16 100 % -- --   Physical Exam Vitals and nursing note reviewed.  Constitutional:      General: She is not in acute distress.    Appearance: Normal appearance. She is normal weight. She is not ill-appearing, toxic-appearing or diaphoretic.  HENT:     Head: Normocephalic and atraumatic.  Pulmonary:     Effort: Pulmonary effort is normal.  Neurological:     Mental Status: She is alert and oriented to person, place, and time.   Psychiatric:        Mood and Affect: Mood normal.        Behavior: Behavior normal.        Thought Content: Thought content normal.        Judgment: Judgment normal.    Results for orders placed or performed during the hospital encounter of 08/26/20 (from the past 24 hour(s))  Urinalysis, Routine w reflex microscopic Urine, Clean Catch     Status: Abnormal   Collection Time: 08/26/20  3:13 PM  Result Value Ref Range   Color, Urine AMBER (A) YELLOW   APPearance CLOUDY (A) CLEAR   Specific Gravity, Urine 1.028 1.005 - 1.030   pH 5.0 5.0 - 8.0   Glucose, UA NEGATIVE NEGATIVE mg/dL   Hgb urine dipstick NEGATIVE NEGATIVE   Bilirubin Urine NEGATIVE NEGATIVE   Ketones, ur NEGATIVE NEGATIVE mg/dL   Protein, ur 604100 (A) NEGATIVE mg/dL   Nitrite NEGATIVE NEGATIVE   Leukocytes,Ua SMALL (A) NEGATIVE   RBC / HPF 6-10 0 - 5 RBC/hpf   WBC, UA 11-20 0 - 5 WBC/hpf   Bacteria, UA RARE (A) NONE SEEN   Squamous Epithelial / LPF 11-20 0 - 5   Mucus PRESENT    Non Squamous Epithelial 0-5 (A) NONE SEEN  Protein / creatinine ratio, urine     Status: None   Collection Time: 08/26/20  3:14 PM  Result Value Ref Range   Creatinine, Urine 284.08 mg/dL   Total Protein, Urine 32 mg/dL   Protein Creatinine Ratio 0.11 0.00 - 0.15 mg/mg[Cre]  CBC     Status: Abnormal   Collection Time: 08/26/20  3:37 PM  Result Value Ref Range   WBC 7.3 4.0 - 10.5 K/uL   RBC 3.40 (L) 3.87 - 5.11 MIL/uL   Hemoglobin 10.7 (L) 12.0 - 15.0 g/dL   HCT 54.031.8 (L) 36 - 46 %   MCV 93.5 80.0 - 100.0 fL   MCH 31.5 26.0 - 34.0 pg   MCHC 33.6 30.0 - 36.0 g/dL   RDW 98.113.6 19.111.5 - 47.815.5 %   Platelets 264 150 - 400 K/uL   nRBC 0.0 0.0 - 0.2 %  Comprehensive metabolic panel     Status: Abnormal   Collection Time: 08/26/20  3:37 PM  Result Value Ref Range   Sodium 135 135 - 145 mmol/L   Potassium 4.0 3.5 - 5.1 mmol/L   Chloride 107 98 - 111 mmol/L   CO2 21 (L) 22 - 32 mmol/L   Glucose, Bld 136 (H) 70 - 99 mg/dL   BUN 10 6 - 20 mg/dL    Creatinine, Ser 2.950.83 0.44 - 1.00 mg/dL   Calcium 8.7 (L) 8.9 - 10.3 mg/dL   Total Protein 6.1 (L) 6.5 - 8.1 g/dL   Albumin 2.6 (L) 3.5 - 5.0 g/dL   AST 16 15 - 41 U/L   ALT 10 0 - 44 U/L   Alkaline Phosphatase 122 38 - 126 U/L   Total Bilirubin 0.2 (L) 0.3 - 1.2 mg/dL   GFR, Estimated >62>60 >13>60 mL/min   Anion gap 7 5 - 15   US MFM FETAL BPP WO NON STRESS  Result Date: 08/19/2020 ----------------------------------------------------------------------  OBSTETRICS REPORT                        (Signed Final 08/19/2020 11:02 am) ---------------------------------------------------------------------- Patient Info  ID #:       [id number]                          D.O.B.:  28-Mar-1981 (39 yrs)  Name:       Angel Clayton                Visit Date: 08/18/2020 08:32 pm ---------------------------------------------------------------------- Performed By  Attending:        Lin Landsman      Referred By:       [facility] MAU/Triage                    MD  Performed By:     Hurman Horn          [facility] ---------------------------------------------------------------------- Orders  #  Description                           Code        Ordered By  1  [place] MFM FETAL BPP WO NON               37106.26    Cutler Sunday     STRESS ----------------------------------------------------------------------  #  Order #                     Accession #                Episode #  1  [id number]                   [id number]                 [id number] ---------------------------------------------------------------------- Indications  [redacted] weeks gestation of pregnancy                 Z3A.35  Fetal heart rate decelerations affecting        O36.8390  management of mother  Hypertension - Chronic/Pre-existing             O10.019  Obesity complicating pregnancy                  O99.210 E66.9  ---------------------------------------------------------------------- Fetal Evaluation  Num Of Fetuses:          1  Fetal Heart Rate(bpm):   140  Cardiac Activity:        Observed  Presentation:            Cephalic  Placenta:                Posterior  Amniotic Fluid  AFI FV:      Within normal limits  AFI Sum(cm)     %Tile       Largest Pocket(cm)  13.7            48          6  RUQ(cm)       RLQ(cm)       LUQ(cm)        LLQ(cm)  2.6  2             6              3.1 ---------------------------------------------------------------------- Biophysical Evaluation  Amniotic F.V:   Within normal limits       F. Tone:         Observed  F. Movement:    Observed                   Score:           8/8  F. Breathing:   Observed ---------------------------------------------------------------------- OB History  Gravidity:    3          SAB:   1  Living:       0 ---------------------------------------------------------------------- Gestational Age  LMP:           35w 2d        Date:  12/15/19                 EDD:   09/20/20  Best:          Consuello Closs 2d     Det. By:  LMP  (12/15/19)          EDD:   09/20/20 ---------------------------------------------------------------------- Impression  Chronic hypertension  Antenatal testing with biophysical profile 8/8 with good fetal  movement and amniotic fluid. ---------------------------------------------------------------------- Recommendations  Continue weekly testing  Consider delivery between 37-39 weeks pending maternal  and fetal status. ----------------------------------------------------------------------               Lin Landsman, MD Electronically Signed Final Report   08/19/2020 11:02 am ----------------------------------------------------------------------   MAU Course  Procedures  MDM -preeclampsia evaluation without severe range BP in MAU on admission -known cHTN on  Procardia -UA: amber/cloudy/100PRO/sm leuks/rare bacteria -CBC: H/H 10.7/31.8,  platelets 264 -CMP: serum creatinine 0.83, AST/ALT 16/10 -PCr: 0.11 -EFM: reactive with variables       -baseline: 160       -variability: moderate       -accels: present, 15x15       -decels: variable , prolonged late decel (4.25min)        -TOCO: few, irreugular -BPP performed d/t late deceleration, BPP: 8/8 -during the course of her stay in MAU, pt developed 5/10 HA, Tylenol given -consulted with Dr. Alysia Penna who recommends that I call the on-call physician to allow them to make a decision regarding the treatment plan for patient -called and spoke with Dr. Jackelyn Knife regarding patient fetal tracing, who agrees with MAU APP that patient warrants overnight observation for continuous fetal monitoring -admit to [facility]  Orders Placed This Encounter  Procedures  . [place] MFM FETAL BPP WO NON STRESS    Standing Status:   Standing    Number of Occurrences:   1    Order Specific Question:   Symptom/Reason for Exam    Answer:   Late deceleration of fetal heart rate [1610960]  . Urinalysis, Routine w reflex microscopic Urine, Clean Catch    Standing Status:   Standing    Number of Occurrences:   1  . Protein / creatinine ratio, urine    Standing Status:   Standing    Number of Occurrences:   1  . CBC    Standing Status:   Standing    Number of Occurrences:   1  . Comprehensive metabolic panel    Standing Status:   Standing    Number of Occurrences:   1   Meds  ordered this encounter  Medications  . acetaminophen (TYLENOL) tablet 1,000 mg    Assessment and Plan   1. Chronic hypertension affecting pregnancy   2. Late deceleration of fetal heart rate   3. [redacted] weeks gestation of pregnancy    -admit to [facility]  Odie Sera Chrissy Ealey 08/26/2020, 6:33 PM

## 2020-08-26 NOTE — MAU Note (Signed)
Angel Clayton is a 39 y.o. at [redacted]w[redacted]d here in MAU reporting: pt reports she is here for BP check and lab draw but is unsure about what kind of labs. States she had a headache yesterday and this AM but it has since gone away. No visual changes. Denies pain, bleeding, or LOF.  Onset of complaint: ongoing  Pain score: 0/10  Vitals:   08/26/20 1436  BP: (!) 155/84  Pulse: (!) 105  Resp: 16  Temp: 98.4 F (36.9 C)  SpO2: 100%     FHT:159  Lab orders placed from triage: none

## 2020-08-26 NOTE — MAU Note (Addendum)
Transferred via wheelchair to [facility] unit per order.

## 2020-08-27 LAB — RESPIRATORY PANEL BY RT PCR (FLU A&B, COVID)
Influenza A by PCR: NEGATIVE
Influenza B by PCR: NEGATIVE
SARS Coronavirus 2 by RT PCR: NEGATIVE

## 2020-08-27 NOTE — H&P (Signed)
Otie Headlee Knoblock is a 39 y.o. female, G3 P58, EGA [redacted]W[redacted]D with EDC 12-27 presenting for BP eval.  She was seen by Dr. Mindi Slicker in the office on 11-19, known CHTN on Procardia XL 60 mg daily, but BP elevated to 144/108 in the office, no PIH sx.  Dr. Mindi Slicker advised her to go to MAU for evaluation.  She did not go to MAU on 11-19, but presented on 11-20 for this eval.  In MAU, BP labile, no severe range BP, labs normal, no proteinuria, but FHT had a couple of what appeared to be late decels, BPP 8/8.  She was admitted last pm for extended fetal monitoring.  No problems overnight.  OB History    Gravida  3   Para  1   Term      Preterm  0   AB  1   Living  0     SAB  0   TAB  0   Ectopic  1   Multiple  0   Live Births  0        Obstetric Comments  Row 2 not a "para", due to gestation, unable to change       Past Medical History:  Diagnosis Date  . ALLERGIC RHINITIS 07/11/2009  . Anemia   . Headache    x 4 years-saw provider at [facility]  . Hx of breast reduction, elective   . Hypertension   . OBESITY 05/16/2007  . Papilledema of both eyes 09/2018  . Pilonidal cyst    Past Surgical History:  Procedure Laterality Date  . BREAST REDUCTION SURGERY  2005  . LAPAROSCOPIC GASTRIC SLEEVE RESECTION  2016  . LAPAROSCOPY N/A 09/21/2019   Procedure: LAPAROSCOPY DIAGNOSTIC, removal of ectopic, PARTIAL LEFT OOPHERECTOMY;  Surgeon: Edwinna Areola, DO;  Location: [facility] OR;  Service: Gynecology;  Laterality: N/A;  . UPPER GI ENDOSCOPY  09/06/2015  . WISDOM TOOTH EXTRACTION  09/2019   Family History: family history includes Hypertension in her father and mother; Pseudotumor cerebri in her mother. Social History:  reports that she has never smoked. She has never used smokeless tobacco. She reports previous alcohol use. She reports that she does not use drugs.      Review of Systems  Respiratory: Negative.   Cardiovascular: Negative.    Maternal Medical History:  Fetal activity:  Perceived fetal activity is normal.    Prenatal complications: PIH.   Prenatal Complications - Diabetes: none.      Blood pressure 124/70, pulse 91, temperature 98.3 F (36.8 C), temperature source Oral, resp. rate 18, height 5\' 6"  (1.676 m), weight 116.8 kg, last menstrual period 12/15/2019, SpO2 100 %. Maternal Exam:  Uterine Assessment: Contraction strength is mild.  Contraction frequency is rare.   Abdomen: Patient reports no abdominal tenderness. Introitus: Amniotic fluid character: not assessed.     Fetal Exam Fetal Monitor Review: Mode: ultrasound.   Baseline rate: 150.  Variability: moderate (6-25 bpm).   Pattern: accelerations present.       Physical Exam Vitals reviewed.  Constitutional:      Appearance: Normal appearance.  Cardiovascular:     Rate and Rhythm: Normal rate and regular rhythm.  Pulmonary:     Effort: Pulmonary effort is normal. No respiratory distress.  Abdominal:     Palpations: Abdomen is soft.  Neurological:     Mental Status: She is alert.     Assessment/Plan: IUP at 36+ weeks, CHTN with controlled BP on Procardia, possible late decels on  fetal monitoring in MAU.  Overnight, she has had no problems, +FM, BP remains controlled.  FHT is reactive overnight without decels.  Will d/c home, f/u in the office in 2 days, call with problems   Leighton Roach Chaun Uemura 08/27/2020, 9:00 AM

## 2020-08-27 NOTE — Discharge Instructions (Signed)
Preeclampsia precautions 

## 2020-08-27 NOTE — Discharge Summary (Signed)
Physician Discharge Summary  Patient ID: Angel Clayton MRN: [id number] DOB/AGE: [birth year] 39 y.o.  Admit date: 08/26/2020 Discharge date: 08/27/2020  Admission Diagnoses:  IUP at 36 weeks, CHTN, FHR decel  Discharge Diagnoses: Same Active Problems:   Abnormal fetal heart rate affecting pregnancy   Discharged Condition: good  Hospital Course: She was seen in MAU for elevated BP in the office on 11-19.  BP ok, labs ok, but FHR had 2 decels that looked late, BPP 8/8, admitted overnight for observation and extended fetal monitoring.  BP remained stable, FHT overnight reactive without decelerations, felt to be stable for discharge home   Discharge Exam: Blood pressure 124/70, pulse 91, temperature 98.3 F (36.8 C), temperature source Oral, resp. rate 18, height 5\' 6"  (1.676 m), weight 116.8 kg, last menstrual period 12/15/2019, SpO2 100 %. General appearance: alert  Disposition: Discharge disposition: 01-Home or Self Care       Discharge Instructions    Discharge activity:  No Restrictions   Complete by: As directed    Discharge diet:  No restrictions   Complete by: As directed    No sexual activity restrictions   Complete by: As directed      Allergies as of 08/27/2020   No Known Allergies     Medication List    TAKE these medications   acetaminophen 500 MG tablet Commonly known as: TYLENOL Take 1,000 mg by mouth every 6 (six) hours as needed for mild pain or headache.   diphenhydrAMINE 25 mg capsule Commonly known as: BENADRYL Take 25 mg by mouth as needed for allergies.   diphenhydramine-acetaminophen 25-500 MG Tabs tablet Commonly known as: TYLENOL PM Take 2 tablets by mouth at bedtime as needed (sleep).   ferrous sulfate 325 (65 FE) MG EC tablet Take 325 mg by mouth 3 (three) times daily with meals.   hydrOXYzine 25 MG capsule Commonly known as: Vistaril Take 1 capsule (25 mg total) by mouth at bedtime and may repeat dose one time if needed.    NIFEdipine 60 MG 24 hr tablet Commonly known as: PROCARDIA XL/NIFEDICAL XL Take 1 tablet (60 mg total) by mouth daily.   PRENATAL PO Take 2 tablets by mouth daily.       Follow-up Information    08/29/2020, DO. Schedule an appointment as soon as possible for a visit in 2 day(s).   Specialty: Obstetrics and Gynecology Contact information: [address] [place] [address] [place] [phone]               Signed: [phone] Tabria Steines 08/27/2020, 11:22 AM

## 2020-09-05 ENCOUNTER — Telehealth (HOSPITAL_COMMUNITY): Payer: Self-pay | Admitting: *Deleted

## 2020-09-05 NOTE — Telephone Encounter (Signed)
Preadmission screen  

## 2020-09-06 ENCOUNTER — Inpatient Hospital Stay (HOSPITAL_COMMUNITY)
Admission: AD | Admit: 2020-09-06 | Discharge: 2020-09-08 | DRG: 806 | Disposition: A | Discharge status: Home / Self Care | Payer: 59 | Attending: Obstetrics and Gynecology | Admitting: Obstetrics and Gynecology

## 2020-09-06 ENCOUNTER — Other Ambulatory Visit (HOSPITAL_COMMUNITY)
Admission: RE | Admit: 2020-09-06 | Discharge: 2020-09-06 | Disposition: A | Discharge status: Home / Self Care | Payer: 59 | Source: Ambulatory Visit | Attending: Obstetrics and Gynecology | Admitting: Obstetrics and Gynecology

## 2020-09-06 ENCOUNTER — Other Ambulatory Visit: Payer: Self-pay

## 2020-09-06 ENCOUNTER — Encounter (HOSPITAL_COMMUNITY): Payer: Self-pay | Admitting: Obstetrics and Gynecology

## 2020-09-06 DIAGNOSIS — O26873 Cervical shortening, third trimester: Secondary | ICD-10-CM | POA: Diagnosis present

## 2020-09-06 DIAGNOSIS — Z20822 Contact with and (suspected) exposure to covid-19: Secondary | ICD-10-CM | POA: Diagnosis present

## 2020-09-06 DIAGNOSIS — O1002 Pre-existing essential hypertension complicating childbirth: Secondary | ICD-10-CM | POA: Diagnosis present

## 2020-09-06 DIAGNOSIS — Z01812 Encounter for preprocedural laboratory examination: Secondary | ICD-10-CM | POA: Insufficient documentation

## 2020-09-06 DIAGNOSIS — Z3A38 38 weeks gestation of pregnancy: Secondary | ICD-10-CM | POA: Diagnosis not present

## 2020-09-06 DIAGNOSIS — O26893 Other specified pregnancy related conditions, third trimester: Secondary | ICD-10-CM | POA: Diagnosis present

## 2020-09-06 LAB — TYPE AND SCREEN
ABO/RH(D): B POS
Antibody Screen: NEGATIVE

## 2020-09-06 LAB — COMPREHENSIVE METABOLIC PANEL
ALT: 12 U/L (ref 0–44)
AST: 15 U/L (ref 15–41)
Albumin: 2.9 g/dL — ABNORMAL LOW (ref 3.5–5.0)
Alkaline Phosphatase: 168 U/L — ABNORMAL HIGH (ref 38–126)
Anion gap: 13 (ref 5–15)
BUN: 8 mg/dL (ref 6–20)
CO2: 15 mmol/L — ABNORMAL LOW (ref 22–32)
Calcium: 8.9 mg/dL (ref 8.9–10.3)
Chloride: 106 mmol/L (ref 98–111)
Creatinine, Ser: 0.74 mg/dL (ref 0.44–1.00)
GFR, Estimated: >60 mL/min (ref >60)
Glucose, Bld: 101 mg/dL — ABNORMAL HIGH (ref 70–99)
Potassium: 3.8 mmol/L (ref 3.5–5.1)
Sodium: 134 mmol/L — ABNORMAL LOW (ref 135–145)
Total Bilirubin: 0.3 mg/dL (ref 0.3–1.2)
Total Protein: 6.5 g/dL (ref 6.5–8.1)

## 2020-09-06 LAB — CBC
HCT: 33.4 % — ABNORMAL LOW (ref 36.0–46.0)
Hemoglobin: 11.3 g/dL — ABNORMAL LOW (ref 12.0–15.0)
MCH: 31.4 pg (ref 26.0–34.0)
MCHC: 33.8 g/dL (ref 30.0–36.0)
MCV: 92.8 fL (ref 80.0–100.0)
Platelets: 280 10*3/uL (ref 150–400)
RBC: 3.6 MIL/uL — ABNORMAL LOW (ref 3.87–5.11)
RDW: 13.5 % (ref 11.5–15.5)
WBC: 8.8 10*3/uL (ref 4.0–10.5)
nRBC: 0 % (ref 0.0–0.2)

## 2020-09-06 LAB — SARS CORONAVIRUS 2 (TAT 6-24 HRS): SARS Coronavirus 2: NEGATIVE

## 2020-09-06 LAB — POCT FERN TEST: POCT Fern Test: POSITIVE

## 2020-09-06 MED ORDER — EPHEDRINE 5 MG/ML INJ: 10.0000 mg | INTRAVENOUS | Status: DC | PRN

## 2020-09-06 MED ORDER — SOD CITRATE-CITRIC ACID 500-334 MG/5ML PO SOLN: 30.0000 mL | ORAL | Status: DC | PRN

## 2020-09-06 MED ORDER — BUTORPHANOL TARTRATE 1 MG/ML IJ SOLN
INTRAMUSCULAR | Status: AC
  Administered 2020-09-06: 1 mg via INTRAVENOUS
  Filled 2020-09-06: qty 1

## 2020-09-06 MED ORDER — LABETALOL HCL 5 MG/ML IV SOLN: 40.0000 mg | INTRAVENOUS | Status: DC | PRN

## 2020-09-06 MED ORDER — BUTORPHANOL TARTRATE 1 MG/ML IJ SOLN: 1.0000 mg | INTRAMUSCULAR | Status: DC | PRN

## 2020-09-06 MED ORDER — ACETAMINOPHEN 325 MG PO TABS: 650.0000 mg | ORAL_TABLET | ORAL | Status: DC | PRN

## 2020-09-06 MED ORDER — ONDANSETRON HCL 4 MG/2ML IJ SOLN: 4.0000 mg | Freq: Four times a day (QID) | INTRAMUSCULAR | Status: DC | PRN

## 2020-09-06 MED ORDER — FENTANYL-BUPIVACAINE-NACL 0.5-0.125-0.9 MG/250ML-% EP SOLN: 12.0000 mL/h | EPIDURAL | Status: DC | PRN

## 2020-09-06 MED ORDER — LABETALOL HCL 5 MG/ML IV SOLN
20.0000 mg | INTRAVENOUS | Status: DC | PRN
  Administered 2020-09-06: 20 mg via INTRAVENOUS
  Filled 2020-09-06: qty 4

## 2020-09-06 MED ORDER — HYDRALAZINE HCL 20 MG/ML IJ SOLN: 10.0000 mg | INTRAMUSCULAR | Status: DC | PRN

## 2020-09-06 MED ORDER — LABETALOL HCL 5 MG/ML IV SOLN: 80.0000 mg | INTRAVENOUS | Status: DC | PRN

## 2020-09-06 MED ORDER — PHENYLEPHRINE 40 MCG/ML (10ML) SYRINGE FOR IV PUSH (FOR BLOOD PRESSURE SUPPORT): 80.0000 ug | PREFILLED_SYRINGE | INTRAVENOUS | Status: DC | PRN

## 2020-09-06 MED ORDER — OXYTOCIN BOLUS FROM INFUSION
333.0000 mL | Freq: Once | INTRAVENOUS | Status: AC
  Administered 2020-09-07: 333 mL via INTRAVENOUS

## 2020-09-06 MED ORDER — OXYCODONE-ACETAMINOPHEN 5-325 MG PO TABS: 1.0000 | ORAL_TABLET | ORAL | Status: DC | PRN

## 2020-09-06 MED ORDER — OXYCODONE-ACETAMINOPHEN 5-325 MG PO TABS: 2.0000 | ORAL_TABLET | ORAL | Status: DC | PRN

## 2020-09-06 MED ORDER — LACTATED RINGERS IV SOLN: 500.0000 mL | Freq: Once | INTRAVENOUS | Status: DC

## 2020-09-06 MED ORDER — OXYTOCIN-SODIUM CHLORIDE 30-0.9 UT/500ML-% IV SOLN
2.5000 [IU]/h | INTRAVENOUS | Status: DC
  Filled 2020-09-06: qty 500

## 2020-09-06 MED ORDER — LIDOCAINE HCL (PF) 1 % IJ SOLN
30.0000 mL | INTRAMUSCULAR | Status: AC | PRN
  Administered 2020-09-07: 30 mL via SUBCUTANEOUS
  Filled 2020-09-06: qty 30

## 2020-09-06 MED ORDER — LACTATED RINGERS IV SOLN: 500.0000 mL | INTRAVENOUS | Status: DC | PRN

## 2020-09-06 MED ORDER — DIPHENHYDRAMINE HCL 50 MG/ML IJ SOLN: 12.5000 mg | INTRAMUSCULAR | Status: DC | PRN

## 2020-09-06 MED ORDER — LACTATED RINGERS IV SOLN: INTRAVENOUS | Status: DC

## 2020-09-06 NOTE — MAU Note (Addendum)
STates water broke about 1930. Fld alittle yellow with some white in it. Having some ctxs. 3cm /80%. For IOL Friday so had covid test this am.

## 2020-09-07 ENCOUNTER — Encounter (HOSPITAL_COMMUNITY): Payer: Self-pay | Admitting: Obstetrics and Gynecology

## 2020-09-07 LAB — OB RESULTS CONSOLE GBS: GBS: NEGATIVE

## 2020-09-07 LAB — RPR: RPR Ser Ql: NONREACTIVE

## 2020-09-07 MED ORDER — ZOLPIDEM TARTRATE 5 MG PO TABS: 5.0000 mg | ORAL_TABLET | Freq: Every evening | ORAL | Status: DC | PRN

## 2020-09-07 MED ORDER — MEASLES, MUMPS & RUBELLA VAC IJ SOLR: 0.5000 mL | Freq: Once | INTRAMUSCULAR | Status: DC

## 2020-09-07 MED ORDER — ONDANSETRON HCL 4 MG PO TABS: 4.0000 mg | ORAL_TABLET | ORAL | Status: DC | PRN

## 2020-09-07 MED ORDER — METHYLERGONOVINE MALEATE 0.2 MG PO TABS: 0.2000 mg | ORAL_TABLET | ORAL | Status: DC | PRN

## 2020-09-07 MED ORDER — OXYCODONE HCL 5 MG PO TABS
5.0000 mg | ORAL_TABLET | ORAL | Status: DC | PRN
  Administered 2020-09-07 (×2): 5 mg via ORAL
  Filled 2020-09-07 (×2): qty 1

## 2020-09-07 MED ORDER — OXYCODONE HCL 5 MG PO TABS: 10.0000 mg | ORAL_TABLET | ORAL | Status: DC | PRN

## 2020-09-07 MED ORDER — NIFEDIPINE ER OSMOTIC RELEASE 30 MG PO TB24
60.0000 mg | ORAL_TABLET | Freq: Every day | ORAL | Status: DC
  Administered 2020-09-07 – 2020-09-08 (×2): 60 mg via ORAL
  Filled 2020-09-07 (×2): qty 2

## 2020-09-07 MED ORDER — SIMETHICONE 80 MG PO CHEW: 80.0000 mg | CHEWABLE_TABLET | ORAL | Status: DC | PRN

## 2020-09-07 MED ORDER — FENTANYL CITRATE (PF) 100 MCG/2ML IJ SOLN
100.0000 ug | Freq: Once | INTRAMUSCULAR | Status: AC
  Administered 2020-09-07: 100 ug via INTRAVENOUS

## 2020-09-07 MED ORDER — TETANUS-DIPHTH-ACELL PERTUSSIS 5-2.5-18.5 LF-MCG/0.5 IM SUSY: 0.5000 mL | PREFILLED_SYRINGE | Freq: Once | INTRAMUSCULAR | Status: DC

## 2020-09-07 MED ORDER — DIPHENHYDRAMINE HCL 25 MG PO CAPS: 25.0000 mg | ORAL_CAPSULE | Freq: Four times a day (QID) | ORAL | Status: DC | PRN

## 2020-09-07 MED ORDER — FENTANYL CITRATE (PF) 100 MCG/2ML IJ SOLN
INTRAMUSCULAR | Status: AC
  Filled 2020-09-07: qty 2

## 2020-09-07 MED ORDER — BENZOCAINE-MENTHOL 20-0.5 % EX AERO
1.0000 "application " | INHALATION_SPRAY | CUTANEOUS | Status: DC | PRN
  Filled 2020-09-07: qty 56

## 2020-09-07 MED ORDER — MAGNESIUM HYDROXIDE 400 MG/5ML PO SUSP: 30.0000 mL | ORAL | Status: DC | PRN

## 2020-09-07 MED ORDER — IBUPROFEN 600 MG PO TABS
600.0000 mg | ORAL_TABLET | Freq: Four times a day (QID) | ORAL | Status: DC
  Administered 2020-09-07 – 2020-09-08 (×6): 600 mg via ORAL
  Filled 2020-09-07 (×5): qty 1

## 2020-09-07 MED ORDER — ONDANSETRON HCL 4 MG/2ML IJ SOLN: 4.0000 mg | INTRAMUSCULAR | Status: DC | PRN

## 2020-09-07 MED ORDER — COCONUT OIL OIL: 1.0000 "application " | TOPICAL_OIL | Status: DC | PRN

## 2020-09-07 MED ORDER — METHYLERGONOVINE MALEATE 0.2 MG/ML IJ SOLN: 0.2000 mg | INTRAMUSCULAR | Status: DC | PRN

## 2020-09-07 MED ORDER — SENNOSIDES-DOCUSATE SODIUM 8.6-50 MG PO TABS
2.0000 | ORAL_TABLET | ORAL | Status: DC
  Administered 2020-09-08: 2 via ORAL

## 2020-09-07 MED ORDER — WITCH HAZEL-GLYCERIN EX PADS: 1.0000 "application " | MEDICATED_PAD | CUTANEOUS | Status: DC | PRN

## 2020-09-07 MED ORDER — ACETAMINOPHEN 325 MG PO TABS
650.0000 mg | ORAL_TABLET | ORAL | Status: DC | PRN
  Administered 2020-09-07: 650 mg via ORAL
  Filled 2020-09-07: qty 2

## 2020-09-07 MED ORDER — PRENATAL MULTIVITAMIN CH
1.0000 | ORAL_TABLET | Freq: Every day | ORAL | Status: DC
  Administered 2020-09-07 – 2020-09-08 (×2): 1 via ORAL
  Filled 2020-09-07 (×2): qty 1

## 2020-09-07 MED ORDER — DIBUCAINE (PERIANAL) 1 % EX OINT: 1.0000 "application " | TOPICAL_OINTMENT | CUTANEOUS | Status: DC | PRN

## 2020-09-07 NOTE — Lactation Note (Signed)
This note was copied from a baby's chart. Lactation Consultation Note Baby 4 hrs old. Mom holding baby in cradle hold. Baby fussy. Suggested mom un-swaddle baby hold baby STS. Mom did so. Baby latched to nipple suckling on tip of very short shaft nipple. Hand expressed and finger massage to evert nipple. No colostrum noted. Lt. Areola has 2 nipples. Top nipple long and bifurcated. Bottom nipple round everted. Mom stated she has no feeling in that lower nipple.  Rt. Nipple short shaft. No colostrum noted.  Noted nipple appeared to had been trimmed and re-attached. Mom has had breast re-duction when she was 19 yrs. Old. Mom encouraged to feed baby 8-12 times/24 hours and with feeding cues. Mom shown how to use DEBP & how to disassemble, clean, & reassemble parts. Mom knows to pump q3h for 15-20 min. Shells given to evert nipples. Asked mom which nipple is she going to feed on the Lt. Breast. Mom stated she doesn't have any feeling to the Rt. Breast. encouraged to rest when baby rest. Encouraged to call for assistance or questions. Lactation brochure given. FOB assisting as needed.  Patient Name: Angel Clayton [id number] Date: 09/07/2020 Reason for consult: Initial assessment;Primapara;Early term 37-38.6wks;Breast reduction   Maternal Data Has patient been taught Hand Expression?: Yes Does the patient have breastfeeding experience prior to this delivery?: No  Feeding Feeding Type: Breast Fed  LATCH Score Latch: Too sleepy or reluctant, no latch achieved, no sucking elicited.  Audible Swallowing: None  Type of Nipple: Flat  Comfort (Breast/Nipple): Soft / non-tender  Hold (Positioning): Full assist, staff holds infant at breast  LATCH Score: 3  Interventions Interventions: Breast feeding basics reviewed;Adjust position;DEBP;Assisted with latch;Support pillows;Skin to skin;Position options;Breast massage;Expressed milk;Hand express;Pre-pump if needed;Shells;Reverse  pressure;Breast compression;Hand pump  Lactation Tools Discussed/Used Tools: Shells;Pump Shell Type: Inverted Breast pump type: Double-Electric Breast Pump WIC Program: No Pump Review: Setup, frequency, and cleaning;Milk Storage Initiated by:: [name] RN IBCLC Date initiated:: 09/07/20   Consult Status Consult Status: Follow-up Date: 09/07/20 Follow-up type: In-patient    [name] 09/07/2020, 6:35 AM

## 2020-09-07 NOTE — H&P (Signed)
Angel Clayton is a 39 y.o. female, G3 P0020, EGA 38+ weeks with EDC 12-15 presenting for leaking fluid and ctx.  On eval in [facility]U, grossly ruptured with + fern, VE 3 cm.  PNC complicated by [facility] treated with procardia, normal labs, reassuring antenatal testing.  AMA, Panorama low risk, treated with baby ASA.  Short cervix treated with vaginal progesterone.  OB History    Gravida  3   Para  1   Term      Preterm  0   AB  1   Living  0     SAB  0   TAB  0   Ectopic  1   Multiple  0   Live Births  0        Obstetric Comments  Row 2 not a "para", due to gestation, unable to change       Past Medical History:  Diagnosis Date  . ALLERGIC RHINITIS 07/11/2009  . Anemia   . Headache    x 4 years-saw provider at [facility]  . Hx of breast reduction, elective   . Hypertension   . OBESITY 05/16/2007  . Papilledema of both eyes 09/2018  . Pilonidal cyst    Past Surgical History:  Procedure Laterality Date  . BREAST REDUCTION SURGERY  2005  . LAPAROSCOPIC GASTRIC SLEEVE RESECTION  2016  . LAPAROSCOPY N/A 09/21/2019   Procedure: LAPAROSCOPY DIAGNOSTIC, removal of ectopic, PARTIAL LEFT OOPHERECTOMY;  Surgeon: Edwinna Areola, DO;  Location: [facility] OR;  Service: Gynecology;  Laterality: N/A;  . UPPER GI ENDOSCOPY  09/06/2015  . WISDOM TOOTH EXTRACTION  09/2019   Family History: family history includes Hypertension in her father and mother; Pseudotumor cerebri in her mother. Social History:  reports that she has never smoked. She has never used smokeless tobacco. She reports previous alcohol use. She reports that she does not use drugs.     Maternal Diabetes: No Genetic Screening: Normal Maternal Ultrasounds/Referrals: Normal Fetal Ultrasounds or other Referrals:  None Maternal Substance Abuse:  No Significant Maternal Medications:  None Significant Maternal Lab Results:  Group B Strep negative Other Comments:  None  Review of Systems  Respiratory: Negative.    Cardiovascular: Negative.    Maternal Medical History:  Reason for admission: Rupture of membranes and contractions.   Contractions: Frequency: irregular.   Perceived severity is moderate.    Fetal activity: Perceived fetal activity is normal.    Prenatal complications: PIH.   Prenatal Complications - Diabetes: none.    Dilation: Lip/rim Effacement (%): 100 Station: 0 Exam by:: Dr. Jackelyn Knife Blood pressure (!) 153/105, pulse 100, temperature 98.6 F (37 C), temperature source Oral, resp. rate 16, height 5\' 6"  (1.676 m), weight 120.8 kg, last menstrual period 12/15/2019, SpO2 99 %. Maternal Exam:  Uterine Assessment: Contraction strength is moderate.  Contraction frequency is irregular.   Abdomen: Patient reports no abdominal tenderness. Estimated fetal weight is 8 lbs.   Fetal presentation: vertex  Introitus: Normal vulva. Normal vagina.  Ferning test: positive.  Amniotic fluid character: clear.  Pelvis: adequate for delivery.      Fetal Exam Fetal Monitor Review: Mode: ultrasound.   Baseline rate: 140-150.  Variability: moderate (6-25 bpm).   Pattern: accelerations present and no decelerations.    Fetal State Assessment: Category I - tracings are normal.     Physical Exam Vitals reviewed.  Constitutional:      Appearance: Normal appearance.  Cardiovascular:     Rate and Rhythm: Normal rate and  regular rhythm.  Pulmonary:     Effort: Pulmonary effort is normal. No respiratory distress.  Abdominal:     Palpations: Abdomen is soft.  Genitourinary:    General: Normal vulva.  Neurological:     Mental Status: She is alert.     Prenatal labs: ABO, Rh: --/--/B POS (12/01 2115) Antibody: NEG (12/01 2115) Rubella:  immune RPR:   NR HBsAg:   neg HIV:   NR GBS:  neg   Assessment/Plan: IUP at 38+ weeks with SROM in labor, CHTN controlled with Procardia, normal labs.  She is progressing well, anticipate SVD   Leighton Roach Jasiah Elsen 09/07/2020, 12:01 AM

## 2020-09-07 NOTE — Progress Notes (Signed)
POSTPARTUM PROGRESS NOTE  Post Partum Day #0  Subjective:  No acute events overnight.  Pt denies problems with ambulating, voiding or po intake. Lochia Minimal. Trying to rest after delivery, denies PreE symptoms  Objective: Blood pressure 131/80, pulse (!) 102, temperature 98.8 F (37.1 C), temperature source Oral, resp. rate 20, height 5\' 6"  (1.676 m), weight 120.8 kg, last menstrual period 12/15/2019, SpO2 100 %, unknown if currently breastfeeding.  Physical Exam:  General: alert, cooperative and no distress Lochia:normal flow Chest: CTAB Heart: RRR no m/r/g Abdomen: +BS, soft, nontender Uterine Fundus: firm, 2cm below umbilicus GU: suture intact, healing well, no purulent drainage Extremities: trace edema, neg calf TTP BL, neg Homans BL  Recent Labs    09/06/20 2124  HGB 11.3*  HCT 33.4*    Assessment/Plan:  ASSESSMENT: DELPHINA SCHUM is a 39 y.o. G3P1011 s/p VAVD for matenral exhaustion @ [redacted]w[redacted]d. PNC c/b CHTN on procardia, AMA, short cervix.   Plan for discharge tomorrow, Breastfeeding, Lactation consult and Circumcision prior to discharge  Circ tomorrow given early AM delivery CHTN on meds - continue Procardia 60XL QD, BP elevated earlier c/w unmedicated labor, WNL this AM   LOS: 1 day

## 2020-09-07 NOTE — Lactation Note (Signed)
This note was copied from a baby's chart. Lactation Consultation Note  Patient Name: Angel Clayton Date: 09/07/2020 Reason for consult: Follow-up assessment;Breast reduction;Early term 37-38.6wks  Follow up visit to 15 hours infant of a P2 with hx of breast reduction and CHTN. Infant is showing hunger cues upon arrival. Mother states he latched well and just finished breastfeeding. Infant is jittery and cries inconsolably. Infant has not have any voids and only one stool at birth.   Mother explains she has been pumping but no EBM. Talked about hand expression. Unable to express colostrum. Attempted latch infant, cradle position to right breast. Noted tongue thrusting and no latch achieved. Placed infant skin to skin and he starts crying again.   Discussed other feeding alternatives such as donor milk and formula. Mother decides to use donor milk and consent was signed. 25 mL of donor milk brought to room, LC portioned 12 mL into bottle with purple nipple. Demonstrated paced bottle feeding, upright position and frequent burping. Mother bottle-feeding infant, observed infant tongue thrusting and clicking when sucking from bottle.   Reinforced to continue stimulation breast by pumping every 2-3 hours and feeding everything she pumps. Encouraged to contact [facility] for support when ready to breastfeed baby and recommended to request help for questions or concerns.    All questions answered at this time.    Plan:   1-Use donor milk as needed for feeding following volume guidelines.  2-Pacing when bottle-feed, upright position and frequent burping 3-Monitor voids and stools as signs good intake.  4-Pump every 2-3 hours for proper stimulation. 5-Contact LC as needed for feeds/support/concerns/questions  Maternal Data Has patient been taught Hand Expression?: Yes  Feeding Feeding Type: Breast Fed  LATCH Score Latch: Repeated attempts needed to sustain latch, nipple held in mouth  throughout feeding, stimulation needed to elicit sucking reflex.  Audible Swallowing: None  Type of Nipple: Flat  Comfort (Breast/Nipple): Soft / non-tender  Hold (Positioning): Assistance needed to correctly position infant at breast and maintain latch.  LATCH Score: 5  Interventions Interventions: Breast feeding basics reviewed;Assisted with latch;Skin to skin;Breast massage;Hand express;Adjust position;DEBP  Lactation Tools Discussed/Used Tools: Pump Shell Type: Other (comment) (flat) Breast pump type: Double-Electric Breast Pump   Consult Status Consult Status: Follow-up Date: 09/08/20 Follow-up type: In-patient    Angel Clayton 09/07/2020, 5:00 PM

## 2020-09-08 ENCOUNTER — Ambulatory Visit: Payer: Self-pay

## 2020-09-08 ENCOUNTER — Inpatient Hospital Stay (HOSPITAL_COMMUNITY): Admission: AD | Admit: 2020-09-08 | Payer: 59 | Source: Home / Self Care | Admitting: Obstetrics and Gynecology

## 2020-09-08 ENCOUNTER — Inpatient Hospital Stay (HOSPITAL_COMMUNITY): Payer: 59

## 2020-09-08 MED ORDER — IBUPROFEN 600 MG PO TABS: 600.0000 mg | ORAL_TABLET | Freq: Four times a day (QID) | ORAL | 1 refills | Status: DC | PRN

## 2020-09-08 NOTE — Lactation Note (Signed)
This note was copied from a baby's chart. Lactation Consultation Note  Patient Name: Angel Clayton VHQIO'[id number] Date: 09/08/2020  being d/c.  Parents deny need.  Urged to follow up with lactation serv ices as needed   Maternal Data    Feeding    LATCH Score                   Interventions    Lactation Tools Discussed/Used     Consult Status      Neomia Dear 09/08/2020, 10:55 PM

## 2020-09-08 NOTE — Progress Notes (Signed)
Post Partum Day 1 Subjective: no complaints, up ad lib, voiding, tolerating PO, + flatus and lochai mild. Pt denies HA, SOB, CP or fever. She is bonding well with baby. Desires discharge to home today after circumcision  Objective: Blood pressure (!) 138/94, pulse 94, temperature 98.5 F (36.9 C), temperature source Oral, resp. rate 16, height 5\' 6"  (1.676 m), weight 120.8 kg, last menstrual period 12/15/2019, SpO2 100 %, unknown if currently breastfeeding.  Physical Exam:  General: alert, cooperative and no distress Lochia: appropriate Uterine Fundus: firm Incision: n/a DVT Evaluation: No evidence of DVT seen on physical exam.  Recent Labs    09/06/20 2124  HGB 11.3*  HCT 33.4*    Assessment/Plan: Discharge home, Breastfeeding and Circumcision prior to discharge  BP check in a week and postpartum visit in 6 weeks   LOS: 2 days   Angel Clayton Shea Kapur 09/08/2020, 12:12 PM

## 2020-09-08 NOTE — Discharge Instructions (Signed)
Call office with any concerns [phone] 

## 2020-09-08 NOTE — Discharge Summary (Signed)
Postpartum Discharge Summary  Date of Service updated      Patient Name: Angel Clayton DOB: 05-01-1981 MRN: [id number]  Date of admission: 09/06/2020 Delivery date:09/07/2020  Delivering provider: Jackelyn Knife, TODD  Date of discharge: 09/08/2020  Admitting diagnosis: Indication for care in labor or delivery [O75.9] Intrauterine pregnancy: [redacted]w[redacted]d     Secondary diagnosis:  Active Problems:   Indication for care in labor or delivery   SVD (spontaneous vaginal delivery)  Additional problems: HTN    Discharge diagnosis: Term Pregnancy Delivered and CHTN                                              Post partum procedures:n/a Augmentation: N/A Complications: None  Hospital course: Onset of Labor With Vaginal Delivery      39 y.o. yo G3P1011 at [redacted]w[redacted]d was admitted in Active Labor on 09/06/2020. Patient had an uncomplicated labor course as follows:  Membrane Rupture Time/Date: 7:00 PM ,09/06/2020   Delivery Method:Vaginal, Vacuum (Extractor)  Episiotomy: None  Lacerations:  2nd degree;Perineal  Patient had an uncomplicated postpartum course.  She is ambulating, tolerating a regular diet, passing flatus, and urinating well. Patient is discharged home in stable condition on 09/08/20.  Newborn Data: Birth date:09/07/2020  Birth time:1:37 AM  Gender:Female  Living status:Living  Apgars:6 ,9  Weight:3135 g   Magnesium Sulfate received: No BMZ received: No Rhophylac:N/A  Physical exam  Vitals:   09/07/20 1458 09/07/20 2007 09/08/20 0556 09/08/20 1215  BP: 119/66 138/85 (!) 138/94 138/71  Pulse: (!) 116 (!) 102 94 90  Resp: 20 18 16    Temp: 99.3 F (37.4 C) 98.6 F (37 C) 98.5 F (36.9 C)   TempSrc: Oral Oral Oral   SpO2: 98% 99% 100%   Weight:      Height:       General: alert, cooperative and no distress Lochia: appropriate Uterine Fundus: firm Incision: N/A DVT Evaluation: No evidence of DVT seen on physical exam. Labs: Lab Results  Component Value Date   WBC 8.8  09/06/2020   HGB 11.3 (L) 09/06/2020   HCT 33.4 (L) 09/06/2020   MCV 92.8 09/06/2020   PLT 280 09/06/2020   CMP Latest Ref Rng & Units 09/06/2020  Glucose 70 - 99 mg/dL 14/10/2019)  BUN 6 - 20 mg/dL 8  Creatinine 170(Y - 1.74 mg/dL 9.44  Sodium 9.67 - 591 mmol/L 134(L)  Potassium 3.5 - 5.1 mmol/L 3.8  Chloride 98 - 111 mmol/L 106  CO2 22 - 32 mmol/L 15(L)  Calcium 8.9 - 10.3 mg/dL 8.9  Total Protein 6.5 - 8.1 g/dL 6.5  Total Bilirubin 0.3 - 1.2 mg/dL 0.3  Alkaline Phos 38 - 126 U/L 168(H)  AST 15 - 41 U/L 15  ALT 0 - 44 U/L 12   Edinburgh Score: Edinburgh Postnatal Depression Scale Screening Tool 09/07/2020  I have been able to laugh and see the funny side of things. 0  I have looked forward with enjoyment to things. 0  I have blamed myself unnecessarily when things went wrong. 1  I have been anxious or worried for no good reason. 0  I have felt scared or panicky for no good reason. 0  Things have been getting on top of me. 1  I have been so unhappy that I have had difficulty sleeping. 0  I have felt sad or miserable.  0  I have been so unhappy that I have been crying. 0  The thought of harming myself has occurred to me. 0  Edinburgh Postnatal Depression Scale Total 2      After visit meds:  Allergies as of 09/08/2020   No Known Allergies     Medication List    TAKE these medications   acetaminophen 500 MG tablet Commonly known as: TYLENOL Take 1,000 mg by mouth every 6 (six) hours as needed for mild pain or headache.   diphenhydrAMINE 25 mg capsule Commonly known as: BENADRYL Take 25 mg by mouth as needed for allergies.   diphenhydramine-acetaminophen 25-500 MG Tabs tablet Commonly known as: TYLENOL PM Take 2 tablets by mouth at bedtime as needed (sleep).   ferrous sulfate 325 (65 FE) MG EC tablet Take 325 mg by mouth 3 (three) times daily with meals.   hydrOXYzine 25 MG capsule Commonly known as: Vistaril Take 1 capsule (25 mg total) by mouth at bedtime and may  repeat dose one time if needed.   ibuprofen 600 MG tablet Commonly known as: ADVIL Take 1 tablet (600 mg total) by mouth every 6 (six) hours as needed for moderate pain or cramping.   NIFEdipine 60 MG 24 hr tablet Commonly known as: PROCARDIA XL/NIFEDICAL XL Take 1 tablet (60 mg total) by mouth daily.   PRENATAL PO Take 2 tablets by mouth daily.        Discharge home in stable condition Infant Feeding: Breast Infant Disposition:home with mother Discharge instruction: per After Visit Summary and Postpartum booklet. Activity: Advance as tolerated. Pelvic rest for 6 weeks.  Diet: low salt diet Anticipated Birth Control: Unsure Postpartum Appointment:6 weeks Additional Postpartum F/U: BP check 1 week Future Appointments:No future appointments. Follow up Visit:  Follow-up Information    Meisinger, Tawanna Cooler, MD. Schedule an appointment as soon as possible for a visit.   Specialty: Obstetrics and Gynecology Why: Next week for BP check and 6 weeks for postpartum visit Contact information: [address] [place] [address] [phone]                   09/08/2020 Cathrine Muster, DO

## 2020-09-12 ENCOUNTER — Telehealth: Payer: Self-pay | Admitting: Radiology

## 2020-09-12 NOTE — Telephone Encounter (Signed)
Left message for patient to call [facility] to schedule New headache appointment from referral

## 2020-10-20 ENCOUNTER — Telehealth: Payer: Self-pay | Admitting: Radiology

## 2020-10-20 NOTE — Telephone Encounter (Signed)
Left message for patient to [facility] to call for New Headache with Nada Maclachlan

## 2020-10-24 ENCOUNTER — Ambulatory Visit: Payer: 59

## 2021-05-30 ENCOUNTER — Other Ambulatory Visit: Payer: Self-pay

## 2021-05-30 ENCOUNTER — Ambulatory Visit (INDEPENDENT_AMBULATORY_CARE_PROVIDER_SITE_OTHER): Payer: Self-pay | Admitting: Internal Medicine

## 2021-05-30 ENCOUNTER — Encounter: Payer: Self-pay | Admitting: Internal Medicine

## 2021-05-30 VITALS — BP 148/90 | HR 79 | Temp 98.1°F | Ht 66.0 in | Wt 255.4 lb

## 2021-05-30 DIAGNOSIS — E049 Nontoxic goiter, unspecified: Secondary | ICD-10-CM

## 2021-05-30 DIAGNOSIS — R0600 Dyspnea, unspecified: Secondary | ICD-10-CM

## 2021-05-30 DIAGNOSIS — E559 Vitamin D deficiency, unspecified: Secondary | ICD-10-CM

## 2021-05-30 DIAGNOSIS — I10 Essential (primary) hypertension: Secondary | ICD-10-CM | POA: Insufficient documentation

## 2021-05-30 DIAGNOSIS — R739 Hyperglycemia, unspecified: Secondary | ICD-10-CM

## 2021-05-30 DIAGNOSIS — Z903 Acquired absence of stomach [part of]: Secondary | ICD-10-CM

## 2021-05-30 MED ORDER — FAMOTIDINE 40 MG PO TABS: 40.0000 mg | ORAL_TABLET | Freq: Every day | ORAL | 3 refills | Status: DC

## 2021-05-30 MED ORDER — VITAMIN D3 50 MCG (2000 UT) PO CAPS: 2000.0000 [IU] | ORAL_CAPSULE | Freq: Every day | ORAL | 3 refills | Status: DC

## 2021-05-30 MED ORDER — NIFEDIPINE ER OSMOTIC RELEASE 60 MG PO TB24
60.0000 mg | ORAL_TABLET | Freq: Every day | ORAL | 3 refills | Status: DC
Start: 2021-05-30 — End: 2022-01-17

## 2021-05-30 NOTE — Patient Instructions (Signed)
The Obesity Code book by Jason Fung   These suggestions will probably help you to improve your metabolism if you are not overweight and to lose weight if you are overweight: 1.  Reduce your consumption of sugars and starches.  Eliminate high fructose corn syrup from your diet.  Reduce your consumption of processed foods.  For desserts try to have seasonal fruits, berries, nuts, cheeses or dark chocolate with more than 70% cacao. 2.  Do not snack 3.  You do not have to eat breakfast.  If you choose to have breakfast - eat plain greek yogurt, eggs, oatmeal (without sugar) - use honey if you need to. 4.  Drink water, freshly brewed unsweetened tea (green, black or herbal) or coffee.  Do not drink sodas including diet sodas , juices, beverages sweetened with artificial sweeteners. 5.  Reduce your consumption of refined grains. 6.  Avoid protein drinks such as Optifast, Slim fast etc. Eat chicken, fish, meat, dairy and beans for your sources of protein. 7.  Natural unprocessed fats like cold pressed virgin olive oil, butter, coconut oil are good for you.  Eat avocados. 8.  Increase your consumption of fiber.  Fruits, berries, vegetables, whole grains, flaxseed, chia seeds, beans, popcorn, nuts, oatmeal are good sources of fiber 9.  Use vinegar in your diet, i.e. apple cider vinegar, red wine or balsamic vinegar 10.  You can try fasting.  For example you can skip breakfast and lunch every other day (24-hour fast) 11.  Stress reduction, good night sleep, relaxation, meditation, yoga and other physical activity is likely to help you to maintain low weight too. 12.  If you drink alcohol, limit your alcohol intake to no more than 2 drinks a day.   Cabbage soup recipe that will not make you gain weight: Take 1 small head of cabbage, 1 average pack of celery, 4 green peppers, 4 onions, 2 cans diced tomatoes (they are not available without salt), salt and spices to taste.  Chop cabbage, celery, peppers and  onions.  And tomatoes and 2-2.5 liters (2.5 quarts) of water so that it would just cover the vegetables.  Bring to boil.  Add spices and salt.  Turn heat to low/medium and simmer for 20-25 minutes.  Naturally, you can make a smaller batch and change some of the ingredients.  

## 2021-05-30 NOTE — Progress Notes (Signed)
Subjective:  Patient ID: Angel Clayton, female    DOB: Oct 20, 1980  Age: 40 y.o. MRN: [id number]  CC: Hypertension (Pt states she just had a baby and during her pregnancy BP increased. GYN put her on nifedipine but not taken since she had the baby) and Cough (Pt states when she is sitting she get a lil SOB and then she start coughing/dry)   HPI [name] presents for HTN-elevated blood pressure, possible DM  C/o occasional dry cough, episodic SOB and breathing discomfort.  It can happen at rest. There has been some weight gain after a baby.  She is not breast-feeding No history of blood clots in the family S/p gastric sleeve procedure in 2016   Outpatient Medications Prior to Visit  Medication Sig Dispense Refill   NIFEdipine (PROCARDIA XL/NIFEDICAL XL) 60 MG 24 hr tablet Take 1 tablet (60 mg total) by mouth daily. (Patient not taking: Reported on 05/30/2021) 30 tablet 2   acetaminophen (TYLENOL) 500 MG tablet Take 1,000 mg by mouth every 6 (six) hours as needed for mild pain or headache. (Patient not taking: Reported on 05/30/2021)     diphenhydrAMINE (BENADRYL) 25 mg capsule Take 25 mg by mouth as needed for allergies. (Patient not taking: Reported on 05/30/2021)     diphenhydramine-acetaminophen (TYLENOL PM) 25-500 MG TABS tablet Take 2 tablets by mouth at bedtime as needed (sleep).  (Patient not taking: Reported on 05/30/2021)     ferrous sulfate 325 (65 FE) MG EC tablet Take 325 mg by mouth 3 (three) times daily with meals. (Patient not taking: Reported on 05/30/2021)     hydrOXYzine (VISTARIL) 25 MG capsule Take 1 capsule (25 mg total) by mouth at bedtime and may repeat dose one time if needed. (Patient not taking: Reported on 05/30/2021) 30 capsule 0   ibuprofen (ADVIL) 600 MG tablet Take 1 tablet (600 mg total) by mouth every 6 (six) hours as needed for moderate pain or cramping. (Patient not taking: Reported on 05/30/2021) 40 tablet 1   Prenatal Vit-Fe Fumarate-FA (PRENATAL PO)  Take 2 tablets by mouth daily.  (Patient not taking: Reported on 05/30/2021)     No facility-administered medications prior to visit.    ROS: Review of Systems  Constitutional:  Positive for fatigue and unexpected weight change. Negative for activity change, appetite change and chills.  HENT:  Negative for congestion, mouth sores and sinus pressure.   Eyes:  Negative for visual disturbance.  Respiratory:  Positive for shortness of breath. Negative for cough, chest tightness and wheezing.   Cardiovascular:  Negative for chest pain and palpitations.  Gastrointestinal:  Negative for abdominal pain and nausea.  Genitourinary:  Negative for difficulty urinating, frequency and vaginal pain.  Musculoskeletal:  Negative for back pain and gait problem.  Skin:  Negative for pallor and rash.  Neurological:  Negative for dizziness, tremors, weakness, numbness and headaches.  Psychiatric/Behavioral:  Negative for confusion and sleep disturbance.    Objective:  BP (!) 148/90 (BP Location: Left Arm)   Pulse 79   Temp 98.1 F (36.7 C) (Oral)   Ht 5\' 6"  (1.676 m)   Wt 255 lb 6.4 oz (115.8 kg)   SpO2 98%   BMI 41.22 kg/m   BP Readings from Last 3 Encounters:  05/30/21 (!) 148/90  09/08/20 138/71  08/27/20 124/70    Wt Readings from Last 3 Encounters:  05/30/21 255 lb 6.4 oz (115.8 kg)  09/06/20 266 lb 6.4 oz (120.8 kg)  08/26/20 257 lb  8 oz (116.8 kg)    Physical Exam Constitutional:      General: She is not in acute distress.    Appearance: She is well-developed. She is obese.  HENT:     Head: Normocephalic.     Right Ear: External ear normal.     Left Ear: External ear normal.     Nose: Nose normal.  Eyes:     General:        Right eye: No discharge.        Left eye: No discharge.     Conjunctiva/sclera: Conjunctivae normal.     Pupils: Pupils are equal, round, and reactive to light.  Neck:     Thyroid: No thyromegaly.     Vascular: No JVD.     Trachea: No tracheal  deviation.  Cardiovascular:     Rate and Rhythm: Normal rate and regular rhythm.     Heart sounds: Normal heart sounds.  Pulmonary:     Effort: No respiratory distress.     Breath sounds: No stridor. No wheezing.  Abdominal:     General: Bowel sounds are normal. There is no distension.     Palpations: Abdomen is soft. There is no mass.     Tenderness: There is no abdominal tenderness. There is no guarding or rebound.  Musculoskeletal:        General: No tenderness.     Cervical back: Normal range of motion and neck supple. No rigidity.  Lymphadenopathy:     Cervical: No cervical adenopathy.  Skin:    Findings: No erythema or rash.  Neurological:     Cranial Nerves: No cranial nerve deficit.     Motor: No abnormal muscle tone.     Coordination: Coordination normal.     Deep Tendon Reflexes: Reflexes normal.  Psychiatric:        Behavior: Behavior normal.        Thought Content: Thought content normal.        Judgment: Judgment normal.   Goiter is palpable Calves nontender  Lab Results  Component Value Date   WBC 8.8 09/06/2020   HGB 11.3 (L) 09/06/2020   HCT 33.4 (L) 09/06/2020   PLT 280 09/06/2020   GLUCOSE 101 (H) 09/06/2020   ALT 12 09/06/2020   AST 15 09/06/2020   NA 134 (L) 09/06/2020   K 3.8 09/06/2020   CL 106 09/06/2020   CREATININE 0.74 09/06/2020   BUN 8 09/06/2020   CO2 15 (L) 09/06/2020   TSH 0.84 08/24/2013    No results found.  Assessment & Plan:    Follow-up: No follow-ups on file.  Sonda Primes, MD

## 2021-05-31 ENCOUNTER — Ambulatory Visit (INDEPENDENT_AMBULATORY_CARE_PROVIDER_SITE_OTHER): Payer: Self-pay

## 2021-05-31 ENCOUNTER — Other Ambulatory Visit: Payer: Self-pay

## 2021-05-31 DIAGNOSIS — R0600 Dyspnea, unspecified: Secondary | ICD-10-CM

## 2021-05-31 LAB — URINALYSIS
Bilirubin Urine: NEGATIVE
Hgb urine dipstick: NEGATIVE
Ketones, ur: NEGATIVE
Leukocytes,Ua: NEGATIVE
Nitrite: NEGATIVE
Specific Gravity, Urine: 1.025 (ref 1.000–1.030)
Total Protein, Urine: NEGATIVE
Urine Glucose: NEGATIVE
Urobilinogen, UA: 1 (ref 0.0–1.0)
pH: 6 (ref 5.0–8.0)

## 2021-05-31 LAB — CBC WITH DIFFERENTIAL/PLATELET
Basophils Absolute: 0 10*3/uL (ref 0.0–0.1)
Basophils Relative: 0.4 % (ref 0.0–3.0)
Eosinophils Absolute: 0.1 10*3/uL (ref 0.0–0.7)
Eosinophils Relative: 1.2 % (ref 0.0–5.0)
HCT: 31.6 % — ABNORMAL LOW (ref 36.0–46.0)
Hemoglobin: 10.1 g/dL — ABNORMAL LOW (ref 12.0–15.0)
Lymphocytes Relative: 34.2 % (ref 12.0–46.0)
Lymphs Abs: 2 10*3/uL (ref 0.7–4.0)
MCHC: 32 g/dL (ref 30.0–36.0)
MCV: 88.9 fl (ref 78.0–100.0)
Monocytes Absolute: 0.5 10*3/uL (ref 0.1–1.0)
Monocytes Relative: 8.4 % (ref 3.0–12.0)
Neutro Abs: 3.3 10*3/uL (ref 1.4–7.7)
Neutrophils Relative %: 55.8 % (ref 43.0–77.0)
Platelets: 360 10*3/uL (ref 150.0–400.0)
RBC: 3.55 Mil/uL — ABNORMAL LOW (ref 3.87–5.11)
RDW: 14.3 % (ref 11.5–15.5)
WBC: 6 10*3/uL (ref 4.0–10.5)

## 2021-05-31 LAB — COMPREHENSIVE METABOLIC PANEL
ALT: 11 U/L (ref 0–35)
AST: 14 U/L (ref 0–37)
Albumin: 3.9 g/dL (ref 3.5–5.2)
Alkaline Phosphatase: 79 U/L (ref 39–117)
BUN: 11 mg/dL (ref 6–23)
CO2: 25 mEq/L (ref 19–32)
Calcium: 9.1 mg/dL (ref 8.4–10.5)
Chloride: 105 mEq/L (ref 96–112)
Creatinine, Ser: 0.88 mg/dL (ref 0.40–1.20)
GFR: 83.57 mL/min (ref >60.00)
Glucose, Bld: 86 mg/dL (ref 70–99)
Potassium: 4.2 mEq/L (ref 3.5–5.1)
Sodium: 139 mEq/L (ref 135–145)
Total Bilirubin: 0.4 mg/dL (ref 0.2–1.2)
Total Protein: 7.5 g/dL (ref 6.0–8.3)

## 2021-05-31 LAB — VITAMIN D 25 HYDROXY (VIT D DEFICIENCY, FRACTURES): VITD: 12.36 ng/mL — ABNORMAL LOW (ref 30.00–100.00)

## 2021-05-31 LAB — VITAMIN B12: Vitamin B-12: 559 pg/mL (ref 211–911)

## 2021-05-31 LAB — HEMOGLOBIN A1C: Hgb A1c MFr Bld: 5.7 % (ref 4.6–6.5)

## 2021-05-31 LAB — TSH: TSH: 1.46 u[IU]/mL (ref 0.35–5.50)

## 2021-05-31 LAB — T4, FREE: Free T4: 0.67 ng/dL (ref 0.60–1.60)

## 2021-06-01 ENCOUNTER — Other Ambulatory Visit: Payer: Self-pay | Admitting: Internal Medicine

## 2021-06-01 LAB — IRON,TIBC AND FERRITIN PANEL
%SAT: 8 % (calc) — ABNORMAL LOW (ref 16–45)
Ferritin: 4 ng/mL — ABNORMAL LOW (ref 16–154)
Iron: 31 ug/dL — ABNORMAL LOW (ref 40–190)
TIBC: 406 mcg/dL (calc) (ref 250–450)

## 2021-06-01 LAB — D-DIMER, QUANTITATIVE: D-Dimer, Quant: 0.35 mcg/mL FEU (ref <0.50)

## 2021-06-01 MED ORDER — VITAMIN D (ERGOCALCIFEROL) 1.25 MG (50000 UNIT) PO CAPS: 50000.0000 [IU] | ORAL_CAPSULE | ORAL | 0 refills | Status: DC

## 2021-06-01 MED ORDER — FERROUS SULFATE 325 (65 FE) MG PO TABS: 325.0000 mg | ORAL_TABLET | Freq: Every day | ORAL | 6 refills | Status: DC

## 2021-06-03 ENCOUNTER — Encounter: Payer: Self-pay | Admitting: Internal Medicine

## 2021-06-03 DIAGNOSIS — Z903 Acquired absence of stomach [part of]: Secondary | ICD-10-CM | POA: Insufficient documentation

## 2021-06-03 DIAGNOSIS — E049 Nontoxic goiter, unspecified: Secondary | ICD-10-CM | POA: Insufficient documentation

## 2021-06-03 NOTE — Assessment & Plan Note (Signed)
Will obtain hemoglobin A1c 

## 2021-06-03 NOTE — Assessment & Plan Note (Signed)
Possibly contributing in her shortness of breath situation.  We will obtain thyroid Doppler ultrasound

## 2021-06-03 NOTE — Assessment & Plan Note (Signed)
Elevated blood pressure.  Continue with nifedipine.  No added salt diet.  We will need to pursue weight loss.

## 2021-06-03 NOTE — Assessment & Plan Note (Signed)
Unclear etiology.  Differential diagnosis broad.  Rule out goiter.  Could be related to weight gain and deconditioning.  Will obtain chest x-ray, D-dimer, CBC, thyroid tests, thyroid ultrasound.

## 2021-06-03 NOTE — Assessment & Plan Note (Addendum)
S/p gastric sleeve procedure in 2016 Will obtain CBC, iron, vitamin D, vitamin B12

## 2021-06-03 NOTE — Assessment & Plan Note (Signed)
-   Obtain vitamin D level

## 2021-06-08 ENCOUNTER — Other Ambulatory Visit: Payer: Self-pay

## 2021-06-27 ENCOUNTER — Encounter: Payer: Self-pay | Admitting: Internal Medicine

## 2021-06-27 ENCOUNTER — Ambulatory Visit (INDEPENDENT_AMBULATORY_CARE_PROVIDER_SITE_OTHER): Payer: Managed Care, Other (non HMO) | Admitting: Internal Medicine

## 2021-06-27 ENCOUNTER — Other Ambulatory Visit: Payer: Self-pay

## 2021-06-27 VITALS — BP 132/80 | HR 91 | Temp 98.5°F | Ht 66.0 in | Wt 249.4 lb

## 2021-06-27 DIAGNOSIS — D509 Iron deficiency anemia, unspecified: Secondary | ICD-10-CM

## 2021-06-27 DIAGNOSIS — I10 Essential (primary) hypertension: Secondary | ICD-10-CM

## 2021-06-27 DIAGNOSIS — E559 Vitamin D deficiency, unspecified: Secondary | ICD-10-CM | POA: Diagnosis not present

## 2021-06-27 DIAGNOSIS — Z6841 Body Mass Index (BMI) 40.0 and over, adult: Secondary | ICD-10-CM

## 2021-06-27 DIAGNOSIS — R739 Hyperglycemia, unspecified: Secondary | ICD-10-CM

## 2021-06-27 DIAGNOSIS — R0600 Dyspnea, unspecified: Secondary | ICD-10-CM

## 2021-06-27 MED ORDER — TIRZEPATIDE 2.5 MG/0.5ML ~~LOC~~ SOAJ: 2.5000 mg | SUBCUTANEOUS | 3 refills | Status: DC

## 2021-06-27 NOTE — Assessment & Plan Note (Addendum)
Better -continue with intermittent fasting.  Mounjaro should help to lose weight.

## 2021-06-27 NOTE — Assessment & Plan Note (Signed)
Replacing Vit D

## 2021-06-27 NOTE — Progress Notes (Signed)
Subjective:  Patient ID: Angel Clayton, female    DOB: 10/01/1981  Age: 40 y.o. MRN: [id number]  CC: Follow-up (4 week f/u)   HPI Fredericka H Torgeson presents for SOB, HTN, Vit D def, hyperglycemia The patient started to exercise.  Shortness of breath is better.  She is doing intermittent fasting  Outpatient Medications Prior to Visit  Medication Sig Dispense Refill   Cholecalciferol (VITAMIN D3) 50 MCG (2000 UT) capsule Take 1 capsule (2,000 Units total) by mouth daily. 100 capsule 3   famotidine (PEPCID) 40 MG tablet Take 1 tablet (40 mg total) by mouth daily. 90 tablet 3   ferrous sulfate 325 (65 FE) MG tablet Take 1 tablet (325 mg total) by mouth daily. 30 tablet 6   NIFEdipine (PROCARDIA XL/NIFEDICAL XL) 60 MG 24 hr tablet Take 1 tablet (60 mg total) by mouth daily. 90 tablet 3   norethindrone (MICRONOR) 0.35 MG tablet Take 1 tablet by mouth daily. Take 1 by mouth daily     Vitamin D, Ergocalciferol, (DRISDOL) 1.25 MG (50000 UNIT) CAPS capsule Take 1 capsule (50,000 Units total) by mouth every 7 (seven) days. 8 capsule 0   No facility-administered medications prior to visit.    ROS: Review of Systems  Constitutional:  Positive for fatigue. Negative for activity change, appetite change, chills and unexpected weight change.  HENT:  Negative for congestion, mouth sores and sinus pressure.   Eyes:  Negative for visual disturbance.  Respiratory:  Negative for cough and chest tightness.   Gastrointestinal:  Negative for abdominal pain and nausea.  Genitourinary:  Negative for difficulty urinating, frequency and vaginal pain.  Musculoskeletal:  Negative for back pain and gait problem.  Skin:  Negative for pallor and rash.  Neurological:  Negative for dizziness, tremors, weakness, numbness and headaches.  Psychiatric/Behavioral:  Negative for confusion, sleep disturbance and suicidal ideas.    Objective:  BP 132/80 (BP Location: Left Arm)   Pulse 91   Temp 98.5 F (36.9 C)  (Oral)   Ht 5\' 6"  (1.676 m)   Wt 249 lb 6.4 oz (113.1 kg)   SpO2 96%   BMI 40.25 kg/m   BP Readings from Last 3 Encounters:  06/27/21 132/80  05/30/21 (!) 148/90  09/08/20 138/71    Wt Readings from Last 3 Encounters:  06/27/21 249 lb 6.4 oz (113.1 kg)  05/30/21 255 lb 6.4 oz (115.8 kg)  09/06/20 266 lb 6.4 oz (120.8 kg)    Physical Exam Constitutional:      General: She is not in acute distress.    Appearance: She is well-developed. She is obese.  HENT:     Head: Normocephalic.     Right Ear: External ear normal.     Left Ear: External ear normal.     Nose: Nose normal.  Eyes:     General:        Right eye: No discharge.        Left eye: No discharge.     Conjunctiva/sclera: Conjunctivae normal.     Pupils: Pupils are equal, round, and reactive to light.  Neck:     Thyroid: No thyromegaly.     Vascular: No JVD.     Trachea: No tracheal deviation.  Cardiovascular:     Rate and Rhythm: Normal rate and regular rhythm.     Heart sounds: Normal heart sounds.  Pulmonary:     Effort: No respiratory distress.     Breath sounds: No stridor. No wheezing.  Abdominal:  General: Bowel sounds are normal. There is no distension.     Palpations: Abdomen is soft. There is no mass.     Tenderness: There is no abdominal tenderness. There is no guarding or rebound.  Musculoskeletal:        General: No tenderness.     Cervical back: Normal range of motion and neck supple. No rigidity.  Lymphadenopathy:     Cervical: No cervical adenopathy.  Skin:    Findings: No erythema or rash.  Neurological:     Cranial Nerves: No cranial nerve deficit.     Motor: No abnormal muscle tone.     Coordination: Coordination normal.     Deep Tendon Reflexes: Reflexes normal.  Psychiatric:        Behavior: Behavior normal.        Thought Content: Thought content normal.        Judgment: Judgment normal.    Lab Results  Component Value Date   WBC 6.0 05/31/2021   HGB 10.1 (L) 05/31/2021    HCT 31.6 (L) 05/31/2021   PLT 360.0 05/31/2021   GLUCOSE 86 05/31/2021   ALT 11 05/31/2021   AST 14 05/31/2021   NA 139 05/31/2021   K 4.2 05/31/2021   CL 105 05/31/2021   CREATININE 0.88 05/31/2021   BUN 11 05/31/2021   CO2 25 05/31/2021   TSH 1.46 05/31/2021   HGBA1C 5.7 05/31/2021    No results found.  Assessment & Plan:   Problem List Items Addressed This Visit     Anemia   Relevant Orders   CBC with Differential/Platelet   Dyspnea    Better with exercise.  Continue with weight loss program.  Greggory Keen should be helpful      HTN (hypertension)    Better with exercise.  Continue with weight loss program.  No added salt diet      Hyperglycemia - Primary     Continue with weight loss program.  We will start Mounjaro      Relevant Orders   Comprehensive metabolic panel   Hemoglobin A1c   VITAMIN D 25 Hydroxy (Vit-D Deficiency, Fractures)   Obesity    Better -continue with intermittent fasting.  Mounjaro should help to lose weight.      Relevant Medications   tirzepatide (MOUNJARO) 2.5 MG/0.5ML Pen   Vitamin D deficiency    Replacing Vit D      Relevant Orders   VITAMIN D 25 Hydroxy (Vit-D Deficiency, Fractures)      Follow-up: Return in about 3 months (around 09/26/2021) for a follow-up visit.  Sonda Primes, MD

## 2021-07-05 NOTE — Assessment & Plan Note (Signed)
Better with exercise.  Continue with weight loss program.  No added salt diet

## 2021-07-05 NOTE — Assessment & Plan Note (Signed)
Better with exercise.  Continue with weight loss program.  Greggory Keen should be helpful

## 2021-07-05 NOTE — Assessment & Plan Note (Signed)
Continue with weight loss program.  We will start [facility]

## 2021-07-13 ENCOUNTER — Telehealth: Payer: Self-pay | Admitting: *Deleted

## 2021-07-13 NOTE — Telephone Encounter (Signed)
Rec'd PA for Mounjaro 2.5 mg/0.98ml. completed via cover-my-meds w/ (Key: [id number]). Waiting on approval determination.Marland KitchenRaechel Chute

## 2021-07-18 NOTE — Telephone Encounter (Signed)
Please advise as the PA has been denied. However, they are asking that one of the following be Rx'd in place of the [facility].  Please rx one of the following: Ozempic, Rybelsus, Trulicity or Victoza.

## 2021-07-18 NOTE — Telephone Encounter (Signed)
Angel Clayton can take her [facility] prescription [facility] and they will process Mounjaro through [employer] for $25 a month.  Thanks

## 2021-07-27 NOTE — Telephone Encounter (Signed)
Pt was made aware of the [facility] through the manufacturer she is taking the medication.,,/lmb

## 2021-08-15 ENCOUNTER — Ambulatory Visit
Admission: EM | Admit: 2021-08-15 | Discharge: 2021-08-15 | Discharge status: Against Medical Advice | Payer: Managed Care, Other (non HMO)

## 2021-08-15 ENCOUNTER — Other Ambulatory Visit: Payer: Self-pay

## 2021-08-21 ENCOUNTER — Other Ambulatory Visit: Payer: Self-pay

## 2021-08-21 ENCOUNTER — Encounter: Payer: Self-pay | Admitting: Adult Health

## 2021-08-21 ENCOUNTER — Ambulatory Visit (INDEPENDENT_AMBULATORY_CARE_PROVIDER_SITE_OTHER): Payer: Managed Care, Other (non HMO) | Admitting: Adult Health

## 2021-08-21 VITALS — BP 136/84 | HR 91 | Temp 96.2°F | Ht 65.98 in | Wt 237.4 lb

## 2021-08-21 DIAGNOSIS — R051 Acute cough: Secondary | ICD-10-CM | POA: Diagnosis not present

## 2021-08-21 DIAGNOSIS — H6692 Otitis media, unspecified, left ear: Secondary | ICD-10-CM

## 2021-08-21 DIAGNOSIS — Z8616 Personal history of COVID-19: Secondary | ICD-10-CM | POA: Insufficient documentation

## 2021-08-21 MED ORDER — PREDNISONE 10 MG (21) PO TBPK: ORAL_TABLET | ORAL | 0 refills | Status: DC

## 2021-08-21 MED ORDER — BENZONATATE 100 MG PO CAPS: 100.0000 mg | ORAL_CAPSULE | Freq: Two times a day (BID) | ORAL | 0 refills | Status: DC | PRN

## 2021-08-21 MED ORDER — AMOXICILLIN 875 MG PO TABS: 875.0000 mg | ORAL_TABLET | Freq: Two times a day (BID) | ORAL | 0 refills | Status: AC

## 2021-08-21 NOTE — Patient Instructions (Signed)
Cough, Adult A cough helps to clear your throat and lungs. A cough may be a sign of an illness or another medical condition. An acute cough may only last 2-3 weeks, while a chronic cough may last 8 or more weeks. Many things can cause a cough. They include: Germs (viruses or bacteria) that attack the airway. Breathing in things that bother (irritate) your lungs. Allergies. Asthma. Mucus that runs down the back of your throat (postnasal drip). Smoking. Acid backing up from the stomach into the tube that moves food from the mouth to the stomach (gastroesophageal reflux). Some medicines. Lung problems. Other medical conditions, such as heart failure or a blood clot in the lung (pulmonary embolism). Follow these instructions at home: Medicines Take over-the-counter and prescription medicines only as told by your doctor. Talk with your doctor before you take medicines that stop a cough (cough suppressants). Lifestyle  Do not smoke, and try not to be around smoke. Do not use any products that contain nicotine or tobacco, such as cigarettes, e-cigarettes, and chewing tobacco. If you need help quitting, ask your doctor. Drink enough fluid to keep your pee (urine) pale yellow. Avoid caffeine. Do not drink alcohol if your doctor tells you not to drink. General instructions  Watch for any changes in your cough. Tell your doctor about them. Always cover your mouth when you cough. Stay away from things that make you cough, such as perfume, candles, campfire smoke, or cleaning products. If the air is dry, use a cool mist vaporizer or humidifier in your home. If your cough is worse at night, try using extra pillows to raise your head up higher while you sleep. Rest as needed. Keep all follow-up visits as told by your doctor. This is important. Contact a doctor if: You have new symptoms. You cough up pus. Your cough does not get better after 2-3 weeks, or your cough gets worse. Cough medicine  does not help your cough and you are not sleeping well. You have pain that gets worse or pain that is not helped with medicine. You have a fever. You are losing weight and you do not know why. You have night sweats. Get help right away if: You cough up blood. You have trouble breathing. Your heartbeat is very fast. These symptoms may be an emergency. Do not wait to see if the symptoms will go away. Get medical help right away. Call your local emergency services (911 in the [place] Do not drive yourself to the hospital. Summary A cough helps to clear your throat and lungs. Many things can cause a cough. Take over-the-counter and prescription medicines only as told by your doctor. Always cover your mouth when you cough. Contact a doctor if you have new symptoms or you have a cough that does not get better or gets worse. This information is not intended to replace advice given to you by your health care provider. Make sure you discuss any questions you have with your health care provider. Document Revised: 11/12/2019 Document Reviewed: 10/12/2018 Elsevier Patient Education  [place]. Otitis Media, Adult Otitis media occurs when there is inflammation and fluid in the middle ear with signs and symptoms of an acute infection. The middle ear is a part of the ear that contains bones for hearing as well as air that helps send sounds to the brain. When infected fluid builds up in this space, it causes pressure and can lead to an ear infection. The eustachian tube connects the middle ear  to the back of the nose (nasopharynx) and normally allows air into the middle ear. If the eustachian tube becomes blocked, fluid can build up and become infected. What are the causes? This condition is caused by a blockage in the eustachian tube. This can be caused by mucus or by swelling of the tube. Problems that can cause a blockage include: A cold or other upper respiratory infection. Allergies. An  irritant, such as tobacco smoke. Enlarged adenoids. The adenoids are areas of soft tissue located high in the back of the throat, behind the nose and the roof of the mouth. They are part of the body's defense system (immune system). A mass in the nasopharynx. Damage to the ear caused by pressure changes (barotrauma). What increases the risk? You are more likely to develop this condition if you: Smoke or are exposed to tobacco smoke. Have an opening in the roof of your mouth (cleft palate). Have gastroesophageal reflux. Have an immune system disorder. What are the signs or symptoms? Symptoms of this condition include: Ear pain. Fever. Decreased hearing. Tiredness (lethargy). Fluid leaking from the ear, if the eardrum is ruptured or has burst. Ringing in the ear. How is this diagnosed? This condition is diagnosed with a physical exam. During the exam, your health care provider will use an instrument called an otoscope to look in your ear and check for redness, swelling, and fluid. He or she will also ask about your symptoms. Your health care provider may also order tests, such as: A pneumatic otoscopy. This is a test to check the movement of the eardrum. It is done by squeezing a small amount of air into the ear. A tympanogram. This is a test that shows how well the eardrum moves in response to air pressure in the ear canal. It provides a graph for your health care provider to review. How is this treated? This condition can go away on its own within 3-5 days. But if the condition is caused by a bacterial infection and does not go away on its own, or if it keeps coming back, your health care provider may: Prescribe antibiotic medicine to treat the infection. Prescribe or recommend medicines to control pain. Follow these instructions at home: Take over-the-counter and prescription medicines only as told by your health care provider. If you were prescribed an antibiotic medicine, take it as  told by your health care provider. Do not stop taking the antibiotic even if you start to feel better. Keep all follow-up visits. This is important. Contact a health care provider if: You have bleeding from your nose. There is a lump on your neck. You are not feeling better in 5 days. You feel worse instead of better. Get help right away if: You have severe pain that is not controlled with medicine. You have swelling, redness, or pain around your ear. You have stiffness in your neck. A part of your face is not moving (paralyzed). The bone behind your ear (mastoid bone) is tender when you touch it. You develop a severe headache. Summary Otitis media is redness, soreness, and swelling of the middle ear, usually resulting in pain and decreased hearing. This condition can go away on its own within 3-5 days. If the problem does not go away in 3-5 days, your health care provider may give you medicines to treat the infection. If you were prescribed an antibiotic medicine, take it as told by your health care provider. Follow all instructions that were given to you by your  health care provider. This information is not intended to replace advice given to you by your health care provider. Make sure you discuss any questions you have with your health care provider. Document Revised: 01/01/2021 Document Reviewed: 01/01/2021 Elsevier Patient Education  2022 [employer] Prednisolone Tablets What is this medication? PREDNISOLONE (pred NISS oh lone) treats many conditions such as asthma, allergic reactions, arthritis, inflammatory bowel diseases, adrenal, and blood or bone marrow disorders. It works by decreasing inflammation, slowing down an overactive immune system, or replacing cortisol normally made in the body. Cortisol is a hormone that plays an important role in how the body responds to stress, illness, and injury. It belongs to a group of medications called steroids. This medicine may be used for  other purposes; ask your health care provider or pharmacist if you have questions. COMMON BRAND NAME(S): Millipred, Millipred DP, Millipred DP 12-Day, Millipred DP 6 Day, Prednoral What should I tell my care team before I take this medication? They need to know if you have any of these conditions: Cushing's syndrome Diabetes Glaucoma Heart problems or disease High blood pressure Infection such as herpes, measles, tuberculosis, or chickenpox Kidney disease Liver disease Mental problems Myasthenia gravis Osteoporosis Seizures Stomach ulcer or intestine disease including colitis and diverticulitis Thyroid problem An unusual or allergic reaction to lactose, prednisolone, other medications, foods, dyes, or preservatives Pregnant or trying to get pregnant Breast-feeding How should I use this medication? Take this medication by mouth with a glass of water. Follow the directions on the prescription label. Take it with food or milk to avoid stomach upset. If you are taking this medication once a day, take it in the morning. Do not take more medication than you are told to take. Do not suddenly stop taking your medication because you may develop a severe reaction. Your care team will tell you how much medication to take. If your care team wants you to stop the medication, the dose may be slowly lowered over time to avoid any side effects. Talk to your care team about the use of this medication in children. Special care may be needed. Overdosage: If you think you have taken too much of this medicine contact a poison control center or emergency room at once. NOTE: This medicine is only for you. Do not share this medicine with others. What if I miss a dose? If you miss a dose, take it as soon as you can. If it is almost time for your next dose, take only that dose. Do not take double or extra doses. What may interact with this medication? Do not take this medication with any of the  following: Metyrapone Mifepristone This medication may also interact with the following: Aminoglutethimide Amphotericin B Aspirin and aspirin-like medications Barbiturates Certain medications for diabetes, like glipizide or glyburide Cholestyramine Cholinesterase inhibitors Cyclosporine Digoxin Diuretics Ephedrine Female hormones, like estrogens and birth control pills Isoniazid Ketoconazole NSAIDS, medications for pain and inflammation, like ibuprofen or naproxen Phenytoin Rifampin Toxoids Vaccines Warfarin This list may not describe all possible interactions. Give your health care provider a list of all the medicines, herbs, non-prescription drugs, or dietary supplements you use. Also tell them if you smoke, drink alcohol, or use illegal drugs. Some items may interact with your medicine. What should I watch for while using this medication? Visit your care team for regular checks on your progress. If you are taking this medication over a prolonged period, carry an identification card with your name and address, the type and  dose of your medication, and your care team's name and address. This medication may increase your risk of getting an infection. Tell your care team if you are around anyone with measles or chickenpox, or if you develop sores or blisters that do not heal properly. If you are going to have surgery, tell your care team that you have taken this medication within the last twelve months. Ask your care team about your diet. You may need to lower the amount of salt you eat. This medication may increase blood sugar. Ask your care team if changes in diet or medications are needed if you have diabetes. What side effects may I notice from receiving this medication? Side effects that you should report to your care team as soon as possible: Allergic reactions--skin rash, itching, hives, swelling of the face, lips, tongue, or throat Cushing syndrome--increased fat around the  midsection, upper back, neck, or face, pink or purple stretch marks on the skin, thinning, fragile skin that easily bruises, unexpected hair growth High blood sugar (hyperglycemia)--increased thirst or amount of urine, unusual weakness or fatigue, blurry vision Increase in blood pressure Infection--fever, chills, cough, sore throat, wounds that don't heal, pain or trouble when passing urine, general feeling of discomfort or being unwell Low adrenal gland function--nausea, vomiting, loss of appetite, unusual weakness or fatigue, dizziness Mood and behavior changes--anxiety, nervousness, confusion, hallucinations, irritability, hostility, thoughts of suicide or self-harm, worsening mood, feelings of depression Stomach bleeding--bloody or black, tar-like stools, vomiting blood or brown material that looks like coffee grounds Swelling of the ankles, hands, or feet Side effects that usually do not require medical attention (report to your care team if they continue or are bothersome): Acne General discomfort and fatigue Headache Increase in appetite Nausea Trouble sleeping Weight gain This list may not describe all possible side effects. Call your doctor for medical advice about side effects. You may report side effects to FDA at 1-800-FDA-1088. Where should I keep my medication? Keep out of the reach of children. Store at room temperature between 15 and 30 degrees C (59 and 86 degrees F). Keep container tightly closed. Throw away any unused medication after the expiration date. NOTE: This sheet is a summary. It may not cover all possible information. If you have questions about this medicine, talk to your doctor, pharmacist, or health care provider.  2022 Elsevier/[employer] Standard (2020-12-22 00:00:00) Amoxicillin Capsules or Tablets What is this medication? AMOXICILLIN (a mox i SIL in) treats infections caused by bacteria. It belongs to a group of medications called penicillin antibiotics. It will  not treat colds, the flu, or infections caused by viruses. This medicine may be used for other purposes; ask your health care provider or pharmacist if you have questions. COMMON BRAND NAME(S): Amoxil, Moxilin, Sumox, Trimox What should I tell my care team before I take this medication? They need to know if you have any of these conditions: Kidney disease An unusual or allergic reaction to amoxicillin, other penicillins, cephalosporin antibiotics, other medications, foods, dyes, or preservatives Pregnant or trying to get pregnant Breast-feeding How should I use this medication? Take this medication by mouth with a glass of water. Follow the directions on your prescription label. You can take it with or without food. If it upsets your stomach, take it with food. Take your medication at regular intervals. Do not take your medication more often than directed. Take all of your medication as directed even if you think you are better. Do not skip doses or stop your  medication early. Talk to your pediatrician regarding the use of this medication in children. While this medication may be prescribed for selected conditions, precautions do apply. Overdosage: If you think you have taken too much of this medicine contact a poison control center or emergency room at once. NOTE: This medicine is only for you. Do not share this medicine with others. What if I miss a dose? If you miss a dose, take it as soon as you can. If it is almost time for your next dose, take only that dose. Do not take double or extra doses. What may interact with this medication? Allopurinol Birth control pills Certain antibiotics like chloramphenicol, erythromycin, sulfamethoxazole, tetracycline Certain medications that treat or prevent blood clots like warfarin This list may not describe all possible interactions. Give your health care provider a list of all the medicines, herbs, non-prescription drugs, or dietary supplements you use.  Also tell them if you smoke, drink alcohol, or use illegal drugs. Some items may interact with your medicine. What should I watch for while using this medication? Tell your care team if your symptoms do not start to get better or if they get worse. Do not treat diarrhea with over the counter products. Contact your health care professional if you have diarrhea that lasts more than 2 days or if it is severe and watery. If you have diabetes, you may get a false-positive result for sugar in your urine. Check with your health care professional. Birth control may not work properly while you are taking this medication. Talk to your health care professional about using an extra method of birth control. This medication may cause serious skin reactions. They can happen weeks to months after starting the medication. Contact your health care provider right away if you notice fevers or flu-like symptoms with a rash. The rash may be red or purple and then turn into blisters or peeling of the skin. Or, you might notice a red rash with swelling of the face, lips or lymph nodes in your neck or under your arms. What side effects may I notice from receiving this medication? Side effects that you should report to your care team as soon as possible: Allergic reactions--skin rash, itching, hives, swelling of the face, lips, tongue, or throat Redness, blistering, peeling, or loosening of the skin, including inside the mouth Severe diarrhea, fever Unusual vaginal discharge, itching, or odor Side effects that usually do not require medical attention (report to your care team if they continue or are bothersome): Diarrhea Headache Nausea Vomiting This list may not describe all possible side effects. Call your doctor for medical advice about side effects. You may report side effects to FDA at 1-800-FDA-1088. Where should I keep my medication? Keep out of the reach of children. Store at room temperature between 20 and 25  degrees C (68 and 77 degrees F). Throw away any unused medication after the expiration date. NOTE: This sheet is a summary. It may not cover all possible information. If you have questions about this medicine, talk to your doctor, pharmacist, or health care provider.  2022 Elsevier/[employer] Standard (2020-10-03 00:00:00)

## 2021-08-21 NOTE — Progress Notes (Addendum)
Acute Office Visit  Subjective:    Patient ID: Angel Clayton, female    DOB: 12-13-1980, 40 y.o.   MRN: [id number]  Chief Complaint  Patient presents with   Cough   Nasal Congestion    Cough Associated symptoms include postnasal drip, rhinorrhea and a sore throat. Pertinent negatives include no chills, ear pain, fever, shortness of breath or wheezing.  Patient is in today for dry cough on 08/14/2021 she tested positive for covid.  She continues to have a dry cough. She did have a lot of head congestion last week. She still has productive green sputum. Head congestion and fullness. Denies chest congestion all seems to be caused by post nasal drip.  She tested negative for covid last Friday. Her parents had covid as well. Delsym cough syrup , mucinex day and night.  Denies any pain or any fatigue.  Patient  denies any fever, body aches,chills, rash, chest pain, shortness of breath, nausea, vomiting, or diarrhea.  Denies any past pneumonia.  No LMP recorded. 08/14/21 was last LMP denies pregnancy.  Not breastfeeding.    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   Pilonidal cyst     Past Surgical History:  Procedure Laterality Date   BREAST REDUCTION SURGERY  2005   LAPAROSCOPIC GASTRIC SLEEVE RESECTION  2016   LAPAROSCOPY N/A 09/21/2019   Procedure: LAPAROSCOPY DIAGNOSTIC, removal of ectopic, PARTIAL LEFT OOPHERECTOMY;  Surgeon: Edwinna Areola, DO;  Location: [facility] OR;  Service: Gynecology;  Laterality: N/A;   UPPER GI ENDOSCOPY  09/06/2015   WISDOM TOOTH EXTRACTION  09/2019    Family History  Problem Relation Age of Onset   Hypertension Mother    Pseudotumor cerebri Mother    Hypertension Father     Social History   Socioeconomic History   Marital status: Married     Spouse name: Charles   Number of children: 0   Years of education: college   Highest education level: Not on file  Occupational History   Occupation: Consulting civil engineer    Comment: Dance movement psychotherapist  Tobacco Use   Smoking status: Never   Smokeless tobacco: Never  Building services engineer Use: Never used  Substance and Sexual Activity   Alcohol use: Not Currently   Drug use: Never   Sexual activity: Not Currently    Comment: approx 6 wks   Other Topics Concern   Not on file  Social History Narrative   Lives with husband   Caffeine- 2 teas daily   Social Determinants of Health   Financial Resource Strain: Not on file  Food Insecurity: Not on file  Transportation Needs: Not on file  Physical Activity: Not on file  Stress: Not on file  Social Connections: Not on file  Intimate Partner Violence: Not on file    Outpatient Medications Prior to Visit  Medication Sig Dispense Refill   Cholecalciferol (VITAMIN D3) 50 MCG (2000 UT) capsule Take 1 capsule (2,000 Units total) by mouth daily. 100 capsule 3   famotidine (PEPCID) 40 MG tablet Take 1 tablet (40 mg total) by mouth daily. 90 tablet 3   ferrous sulfate 325 (65 FE) MG tablet Take 1 tablet (325 mg total) by mouth daily. 30 tablet 6  NIFEdipine (PROCARDIA XL/NIFEDICAL XL) 60 MG 24 hr tablet Take 1 tablet (60 mg total) by mouth daily. 90 tablet 3   norethindrone (MICRONOR) 0.35 MG tablet Take 1 tablet by mouth daily. Take 1 by mouth daily     tirzepatide [facility]) 2.5 MG/0.5ML Pen Inject 2.5 mg into the skin once a week. 2 mL 3   Vitamin D, Ergocalciferol, (DRISDOL) 1.25 MG (50000 UNIT) CAPS capsule Take 1 capsule (50,000 Units total) by mouth every 7 (seven) days. 8 capsule 0   No facility-administered medications prior to visit.    No Known Allergies  Review of Systems  Constitutional:  Negative for activity change, appetite change, chills, diaphoresis, fatigue, fever and unexpected weight change.  HENT:  Positive for congestion  (ear and head fullness and congestion.), postnasal drip, rhinorrhea and sore throat. Negative for dental problem, drooling, ear discharge, ear pain, facial swelling, hearing loss, mouth sores, nosebleeds, sinus pressure and sneezing.   Respiratory:  Positive for cough (constant tickle in throat with post nasal drip). Negative for apnea, choking, chest tightness, shortness of breath, wheezing and stridor.   Cardiovascular: Negative.   Genitourinary: Negative.  Negative for dysuria.  Musculoskeletal: Negative.   Skin: Negative.   Neurological: Negative.   Psychiatric/Behavioral: Negative.        Objective:    Physical Exam Vitals reviewed.  Constitutional:      General: She is not in acute distress.    Appearance: Normal appearance. She is not ill-appearing, toxic-appearing or diaphoretic.  HENT:     Head: Normocephalic and atraumatic.     Right Ear: External ear normal. There is no impacted cerumen. Tympanic membrane is not perforated or erythematous.     Left Ear: External ear normal. A middle ear effusion is present. There is no impacted cerumen. Tympanic membrane is erythematous. Tympanic membrane is not perforated.     Nose: Congestion and rhinorrhea present.     Mouth/Throat:     Pharynx: Oropharyngeal exudate and posterior oropharyngeal erythema present.  Eyes:     General: No scleral icterus.       Right eye: No discharge.        Left eye: No discharge.     Conjunctiva/sclera: Conjunctivae normal.     Pupils: Pupils are equal, round, and reactive to light.  Cardiovascular:     Rate and Rhythm: Normal rate and regular rhythm.     Pulses: Normal pulses.     Heart sounds: Normal heart sounds.  Pulmonary:     Effort: Pulmonary effort is normal. No respiratory distress.     Breath sounds: Normal breath sounds. No stridor. No wheezing, rhonchi or rales.  Chest:     Chest wall: No tenderness.  Abdominal:     General: There is no distension.     Palpations: Abdomen is soft.      Tenderness: There is no abdominal tenderness.  Musculoskeletal:        General: Normal range of motion.     Cervical back: Normal range of motion and neck supple. No rigidity or tenderness.  Lymphadenopathy:     Cervical: No cervical adenopathy.  Skin:    General: Skin is warm.     Findings: No erythema or rash.  Neurological:     Mental Status: She is alert.     Motor: No weakness.     Coordination: Coordination abnormal.     Gait: Gait normal.  Psychiatric:        Mood and Affect: Mood normal.  Behavior: Behavior normal.        Thought Content: Thought content normal.    BP 136/84   Pulse 91   Temp (!) 96.2 F (35.7 C)   Ht 5' 5.98" (1.676 m)   Wt 237 lb 6.4 oz (107.7 kg)   SpO2 97%   BMI 38.34 kg/m  Wt Readings from Last 3 Encounters:  08/21/21 237 lb 6.4 oz (107.7 kg)  06/27/21 249 lb 6.4 oz (113.1 kg)  05/30/21 255 lb 6.4 oz (115.8 kg)    Health Maintenance Due  Topic Date Due   Pneumococcal Vaccine 57-36 Years old (1 - PCV) Never done   Hepatitis C Screening  Never done   PAP SMEAR-Modifier  01/22/2020   COVID-19 Vaccine (3 - Pfizer risk series) 01/28/2020    There are no preventive care reminders to display for this patient.   Lab Results  Component Value Date   TSH 1.46 05/31/2021   Lab Results  Component Value Date   WBC 6.0 05/31/2021   HGB 10.1 (L) 05/31/2021   HCT 31.6 (L) 05/31/2021   MCV 88.9 05/31/2021   PLT 360.0 05/31/2021   Lab Results  Component Value Date   NA 139 05/31/2021   K 4.2 05/31/2021   CO2 25 05/31/2021   GLUCOSE 86 05/31/2021   BUN 11 05/31/2021   CREATININE 0.88 05/31/2021   BILITOT 0.4 05/31/2021   ALKPHOS 79 05/31/2021   AST 14 05/31/2021   ALT 11 05/31/2021   PROT 7.5 05/31/2021   ALBUMIN 3.9 05/31/2021   CALCIUM 9.1 05/31/2021   ANIONGAP 13 09/06/2020   GFR 83.57 05/31/2021   No results found for: CHOL No results found for: HDL No results found for: LDLCALC No results found for: TRIG No results  found for: CHOLHDL Lab Results  Component Value Date   HGBA1C 5.7 05/31/2021       Assessment & Plan:   Problem List Items Addressed This Visit       Nervous and Auditory   Left otitis media - Primary   Relevant Medications   amoxicillin (AMOXIL) 875 MG tablet     Other   History of COVID-19   Acute cough   Relevant Medications   predniSONE (STERAPRED UNI-PAK 21 TAB) 10 MG (21) TBPK tablet   benzonatate (TESSALON) 100 MG capsule     Meds ordered this encounter  Medications   predniSONE (STERAPRED UNI-PAK 21 TAB) 10 MG (21) TBPK tablet    Sig: PO: Take 6 tablets on day 1:Take 5 tablets day 2:Take 4 tablets day 3: Take 3 tablets day 4:Take 2 tablets day five: 5 Take 1 tablet day 6    Dispense:  21 tablet    Refill:  0   amoxicillin (AMOXIL) 875 MG tablet    Sig: Take 1 tablet (875 mg total) by mouth 2 (two) times daily for 10 days.    Dispense:  20 tablet    Refill:  0   benzonatate (TESSALON) 100 MG capsule    Sig: Take 1 capsule (100 mg total) by mouth 2 (two) times daily as needed for cough.    Dispense:  20 capsule    Refill:  0   Red Flags discussed. The patient was given clear instructions to go to ER or return to medical center if any red flags develop, symptoms do not improve, worsen or new problems develop. They verbalized understanding.  Return if symptoms worsen or fail to improve, for at any time for any worsening  symptoms, Go to Emergency room/ urgent care if worse.   Jairo Ben, FNP

## 2021-08-21 NOTE — Addendum Note (Signed)
Addended by: Berniece Pap on: 08/21/2021 08:41 AM   Modules accepted: Orders

## 2021-09-26 ENCOUNTER — Other Ambulatory Visit: Payer: Self-pay

## 2021-09-26 ENCOUNTER — Ambulatory Visit (INDEPENDENT_AMBULATORY_CARE_PROVIDER_SITE_OTHER): Payer: Managed Care, Other (non HMO) | Admitting: Internal Medicine

## 2021-09-26 ENCOUNTER — Encounter: Payer: Self-pay | Admitting: Internal Medicine

## 2021-09-26 DIAGNOSIS — R739 Hyperglycemia, unspecified: Secondary | ICD-10-CM

## 2021-09-26 DIAGNOSIS — H6692 Otitis media, unspecified, left ear: Secondary | ICD-10-CM

## 2021-09-26 DIAGNOSIS — R0600 Dyspnea, unspecified: Secondary | ICD-10-CM

## 2021-09-26 DIAGNOSIS — Z8616 Personal history of COVID-19: Secondary | ICD-10-CM | POA: Diagnosis not present

## 2021-09-26 DIAGNOSIS — Z6841 Body Mass Index (BMI) 40.0 and over, adult: Secondary | ICD-10-CM

## 2021-09-26 MED ORDER — TIRZEPATIDE 5 MG/0.5ML ~~LOC~~ SOAJ: 5.0000 mg | SUBCUTANEOUS | 5 refills | Status: DC

## 2021-09-26 NOTE — Assessment & Plan Note (Signed)
Wt Readings from Last 3 Encounters:  °09/26/21 233 lb (105.7 kg)  °08/21/21 237 lb 6.4 oz (107.7 kg)  °06/27/21 249 lb 6.4 oz (113.1 kg)  ° °

## 2021-09-26 NOTE — Progress Notes (Signed)
Subjective:  Patient ID: Angel Clayton, female    DOB: [birth year]  Age: 40 y.o. MRN: [id number]  CC: Follow-up (3 month f/u)   HPI Flossie Haston Nauta presents for hyperglycemia, obesity, recent COVID19 f/u F/u on [facility] in the summer  Outpatient Medications Prior to Visit  Medication Sig Dispense Refill   benzonatate (TESSALON) 100 MG capsule Take 1 capsule (100 mg total) by mouth 2 (two) times daily as needed for cough. 20 capsule 0   Cholecalciferol (VITAMIN D3) 50 MCG (2000 UT) capsule Take 1 capsule (2,000 Units total) by mouth daily. 100 capsule 3   famotidine (PEPCID) 40 MG tablet Take 1 tablet (40 mg total) by mouth daily. 90 tablet 3   ferrous sulfate 325 (65 FE) MG tablet Take 1 tablet (325 mg total) by mouth daily. 30 tablet 6   NIFEdipine (PROCARDIA XL/NIFEDICAL XL) 60 MG 24 hr tablet Take 1 tablet (60 mg total) by mouth daily. 90 tablet 3   norethindrone (MICRONOR) 0.35 MG tablet Take 1 tablet by mouth daily. Take 1 by mouth daily     tirzepatide [facility]) 2.5 MG/0.5ML Pen Inject 2.5 mg into the skin once a week. 2 mL 3   predniSONE (STERAPRED UNI-PAK 21 TAB) 10 MG (21) TBPK tablet PO: Take 6 tablets on day 1:Take 5 tablets day 2:Take 4 tablets day 3: Take 3 tablets day 4:Take 2 tablets day five: 5 Take 1 tablet day 6 21 tablet 0   Vitamin D, Ergocalciferol, (DRISDOL) 1.25 MG (50000 UNIT) CAPS capsule Take 1 capsule (50,000 Units total) by mouth every 7 (seven) days. (Patient not taking: Reported on 09/26/2021) 8 capsule 0   No facility-administered medications prior to visit.    ROS: Review of Systems  Constitutional:  Negative for activity change, appetite change, chills, fatigue and unexpected weight change.  HENT:  Negative for congestion, mouth sores and sinus pressure.   Eyes:  Negative for visual disturbance.  Respiratory:  Negative for cough and chest tightness.   Gastrointestinal:  Negative for abdominal pain and nausea.  Genitourinary:  Negative for difficulty  urinating, frequency and vaginal pain.  Musculoskeletal:  Negative for back pain and gait problem.  Skin:  Negative for pallor and rash.  Neurological:  Negative for dizziness, tremors, weakness, numbness and headaches.  Psychiatric/Behavioral:  Negative for confusion and sleep disturbance.    Objective:  BP 130/80 (BP Location: Left Arm)    Pulse 90    Temp 98.4 F (36.9 C) (Oral)    Ht 5' 5.5" (1.664 m)    Wt 233 lb (105.7 kg)    SpO2 96%    BMI 38.18 kg/m   BP Readings from Last 3 Encounters:  09/26/21 130/80  08/21/21 136/84  06/27/21 132/80    Wt Readings from Last 3 Encounters:  09/26/21 233 lb (105.7 kg)  08/21/21 237 lb 6.4 oz (107.7 kg)  06/27/21 249 lb 6.4 oz (113.1 kg)    Physical Exam Constitutional:      General: She is not in acute distress.    Appearance: She is well-developed.  HENT:     Head: Normocephalic.     Right Ear: External ear normal.     Left Ear: External ear normal.     Nose: Nose normal.  Eyes:     General:        Right eye: No discharge.        Left eye: No discharge.     Conjunctiva/sclera: Conjunctivae normal.  Pupils: Pupils are equal, round, and reactive to light.  Neck:     Thyroid: No thyromegaly.     Vascular: No JVD.     Trachea: No tracheal deviation.  Cardiovascular:     Rate and Rhythm: Normal rate and regular rhythm.     Heart sounds: Normal heart sounds.  Pulmonary:     Effort: No respiratory distress.     Breath sounds: No stridor. No wheezing.  Abdominal:     General: Bowel sounds are normal. There is no distension.     Palpations: Abdomen is soft. There is no mass.     Tenderness: There is no abdominal tenderness. There is no guarding or rebound.  Musculoskeletal:        General: No tenderness.     Cervical back: Normal range of motion and neck supple. No rigidity.  Lymphadenopathy:     Cervical: No cervical adenopathy.  Skin:    Findings: No erythema or rash.  Neurological:     Mental Status: She is oriented  to person, place, and time.     Cranial Nerves: No cranial nerve deficit.     Motor: No abnormal muscle tone.     Coordination: Coordination normal.     Deep Tendon Reflexes: Reflexes normal.  Psychiatric:        Behavior: Behavior normal.        Thought Content: Thought content normal.        Judgment: Judgment normal.    Lab Results  Component Value Date   WBC 6.0 05/31/2021   HGB 10.1 (L) 05/31/2021   HCT 31.6 (L) 05/31/2021   PLT 360.0 05/31/2021   GLUCOSE 86 05/31/2021   ALT 11 05/31/2021   AST 14 05/31/2021   NA 139 05/31/2021   K 4.2 05/31/2021   CL 105 05/31/2021   CREATININE 0.88 05/31/2021   BUN 11 05/31/2021   CO2 25 05/31/2021   TSH 1.46 05/31/2021   HGBA1C 5.7 05/31/2021    No results found.  Assessment & Plan:   Problem List Items Addressed This Visit     Dyspnea    Resolved      History of COVID-19    Recovered      Hyperglycemia     Increase Mounjaro      Left otitis media    BP is better      Obesity    Wt Readings from Last 3 Encounters:  09/26/21 233 lb (105.7 kg)  08/21/21 237 lb 6.4 oz (107.7 kg)  06/27/21 249 lb 6.4 oz (113.1 kg)        Relevant Medications   tirzepatide (MOUNJARO) 5 MG/0.5ML Pen      Meds ordered this encounter  Medications   DISCONTD: tirzepatide (MOUNJARO) 5 MG/0.5ML Pen    Sig: Inject 5 mg into the skin once a week.    Dispense:  6 mL    Refill:  5   tirzepatide (MOUNJARO) 5 MG/0.5ML Pen    Sig: Inject 5 mg into the skin once a week.    Dispense:  6 mL    Refill:  5      Follow-up: Return in about 3 months (around 12/25/2021) for a follow-up visit.  Sonda Primes, MD

## 2021-09-26 NOTE — Assessment & Plan Note (Signed)
Resolved

## 2021-09-26 NOTE — Patient Instructions (Signed)
Wt Readings from Last 3 Encounters:  09/26/21 233 lb (105.7 kg)  08/21/21 237 lb 6.4 oz (107.7 kg)  06/27/21 249 lb 6.4 oz (113.1 kg)

## 2021-09-26 NOTE — Assessment & Plan Note (Signed)
Recovered  

## 2021-09-26 NOTE — Assessment & Plan Note (Signed)
Increase Mounjaro

## 2021-09-26 NOTE — Assessment & Plan Note (Signed)
BP is better

## 2021-10-26 ENCOUNTER — Telehealth: Payer: Self-pay

## 2021-10-26 NOTE — Telephone Encounter (Signed)
PA fo Merrily Pew has been sent in.  Key: [id number]

## 2021-10-31 IMAGING — US US MFM FETAL BPP W/O NON-STRESS
1 series · 12 of 26 positions shown · non-contrast
Comparison: none

[Series 1: us mfm fetal bpp w/o non-stress · 26 acquisitions, 12 frames shown]
[im 2/26]
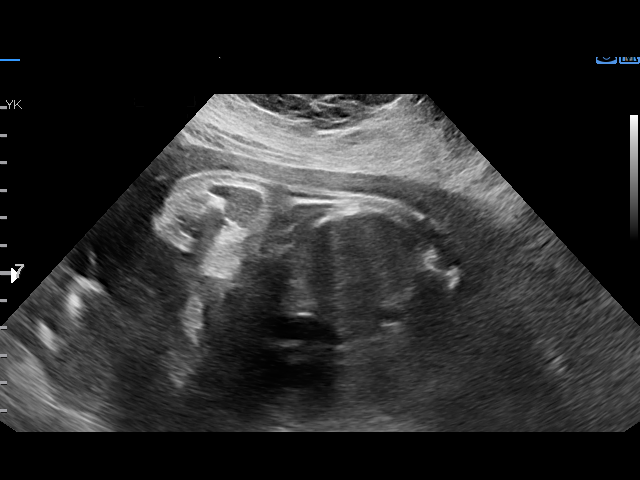
[im 4/26]
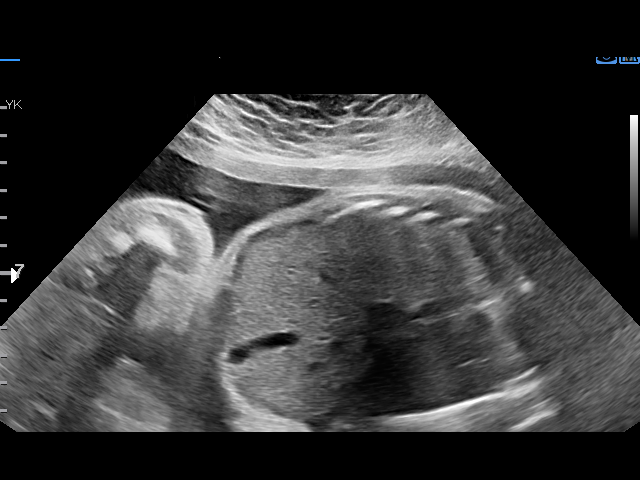
[im 6/26]
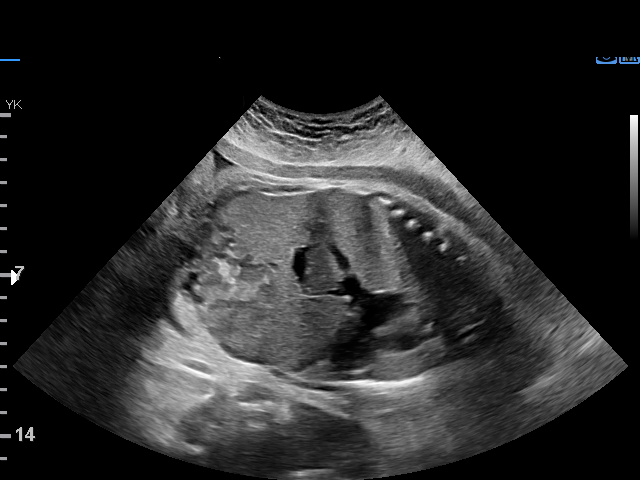
[im 8/26]
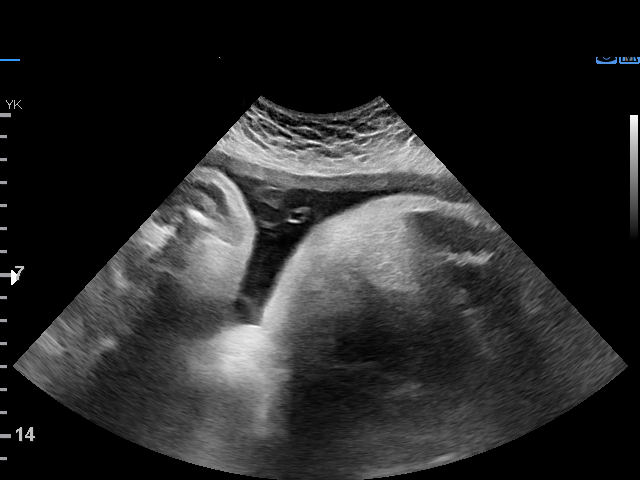
[im 10/26]
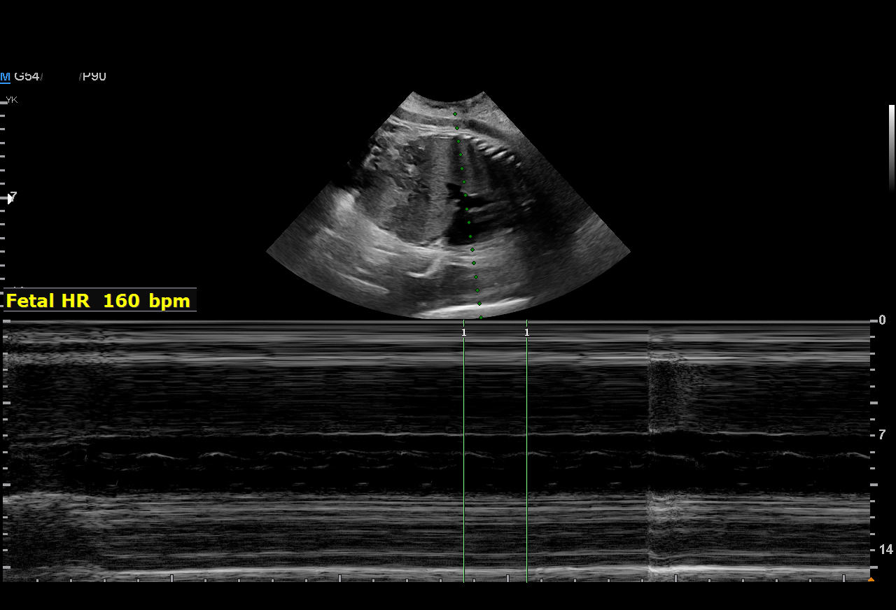
[im 12/26]
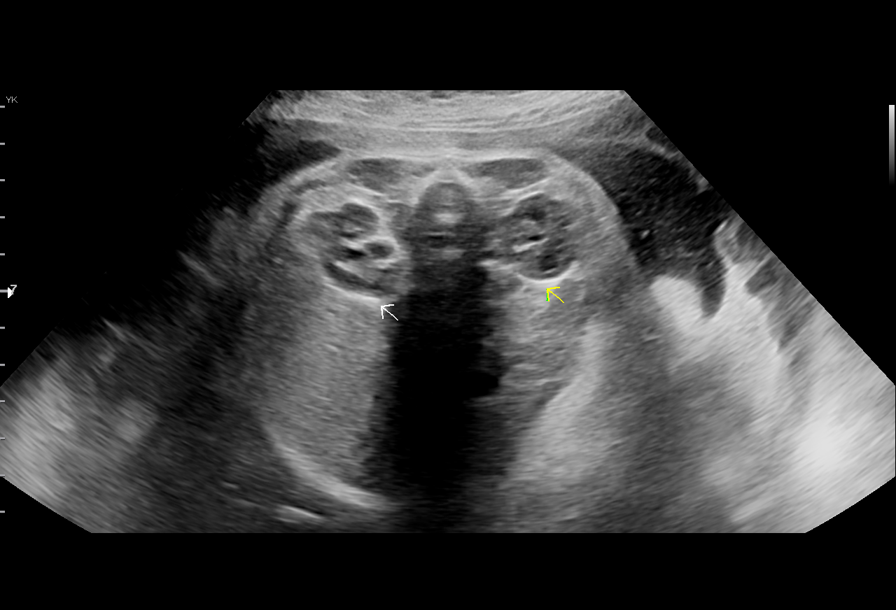
[im 15/26]
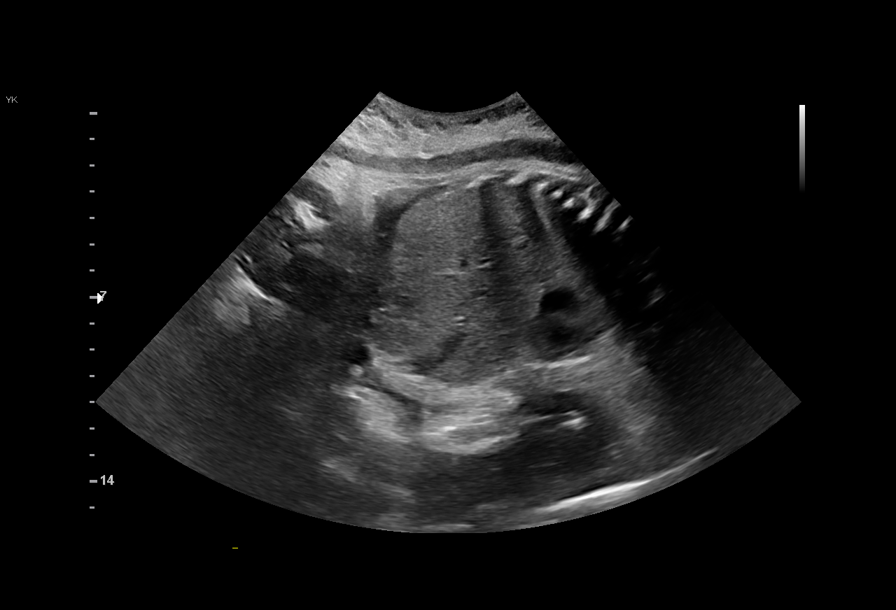
[im 17/26]
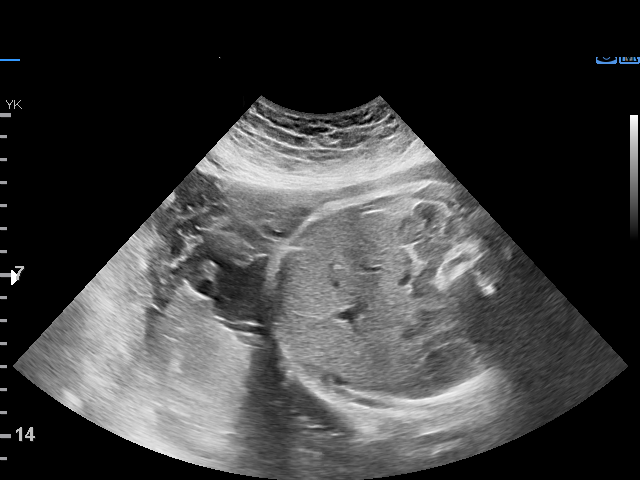
[im 19/26]
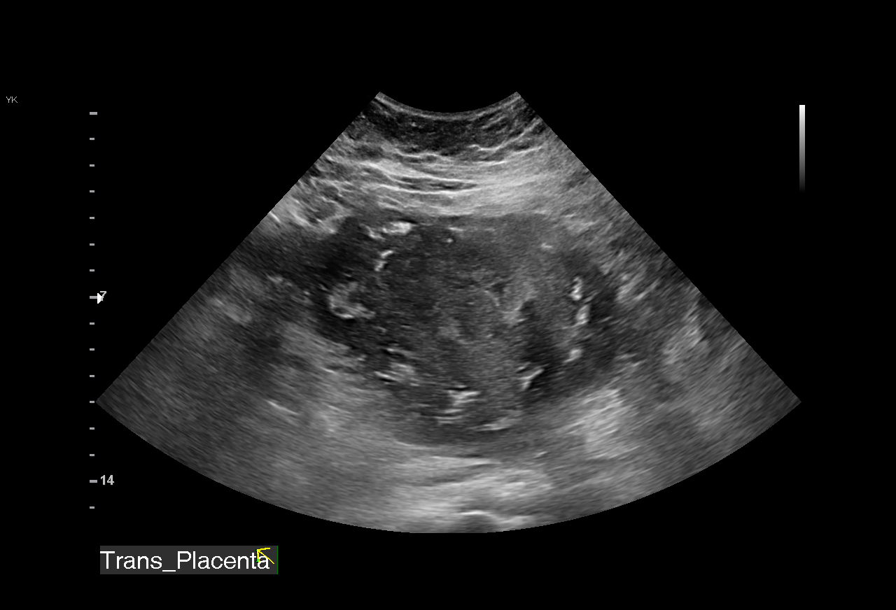
[im 21/26]
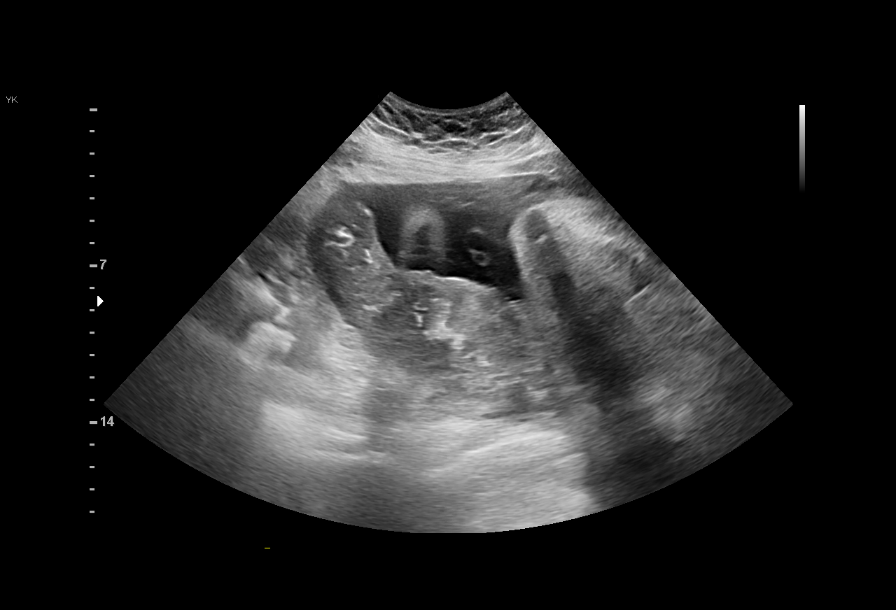
[im 23/26]
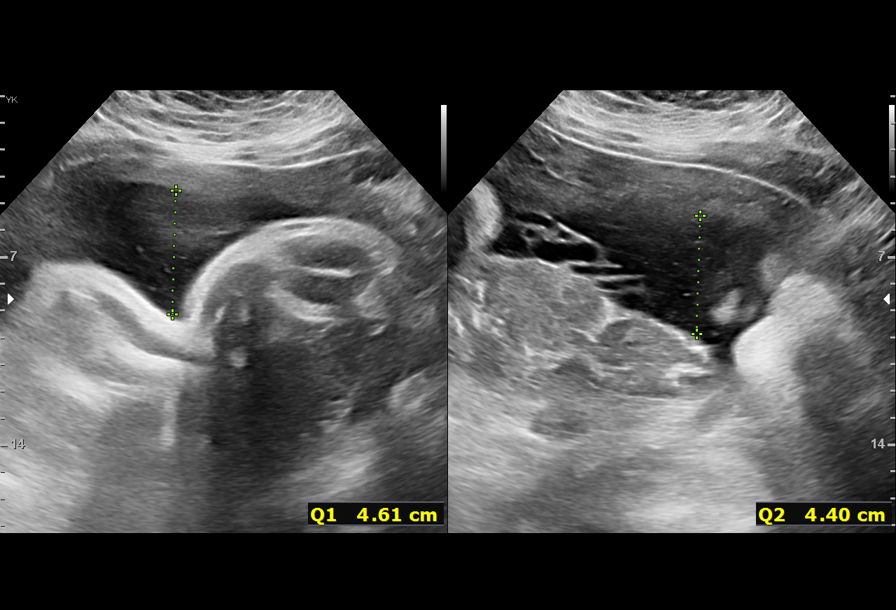
[im 25/26]
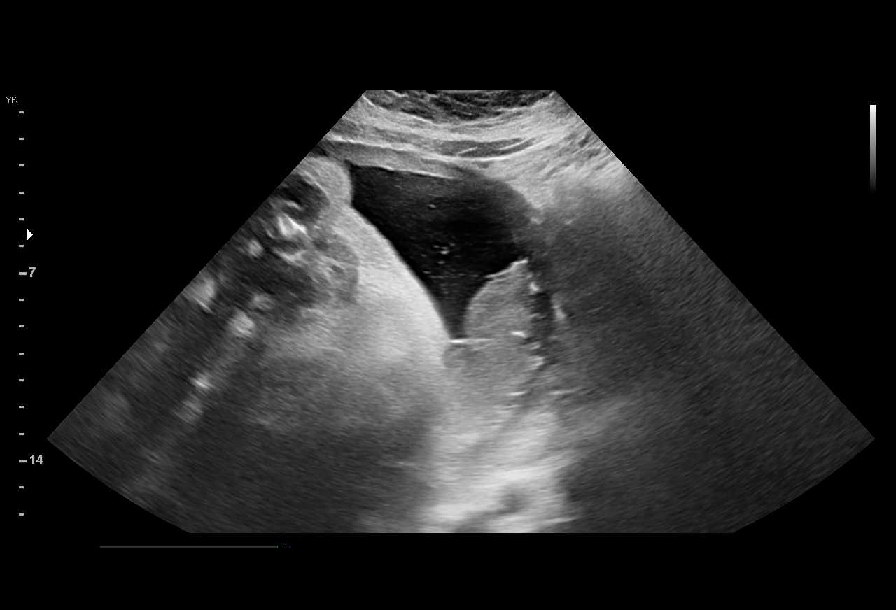

[12 of 26 positions shown; findings below may reference images not displayed]

Indications

 Fetal heart rate decelerations affecting
 management of mother
 Hypertension - Chronic/Pre-existing
 Obesity complicating pregnancy
 36 weeks gestation of pregnancy
Fetal Evaluation

 Num Of Fetuses:          1
 Fetal Heart Rate(bpm):   158
 Cardiac Activity:        Observed
 Presentation:            Cephalic
 Placenta:                Posterior

 Amniotic Fluid
 AFI FV:      Within normal limits

 AFI Sum(cm)     %Tile       Largest Pocket(cm)
 15.7            58

 RUQ(cm)       RLQ(cm)       LUQ(cm)        LLQ(cm)

Biophysical Evaluation

 Amniotic F.V:   Within normal limits       F. Tone:         Observed
 F. Movement:    Observed                   Score:           [DATE]
 F. Breathing:   Observed
OB History

 Gravidity:    3          SAB:   1
 Living:       0
Gestational Age

 LMP:           36w 3d        Date:  12/15/19                 EDD:   09/20/20
 Best:          36w 3d     Det. By:  LMP  (12/15/19)          EDD:   09/20/20
Anatomy

 Diaphragm:             Appears normal         Kidneys:                Appear normal
 Stomach:               Appears normal, left   Bladder:                Appears normal
                        sided
Comments

 This patient presented to the [facility] due to elevated blood
 pressures.
 A biophysical profile performed today was [DATE].
 There was normal amniotic fluid noted on today's ultrasound
 exam.

## 2022-01-02 ENCOUNTER — Ambulatory Visit: Payer: Managed Care, Other (non HMO) | Admitting: Internal Medicine

## 2022-01-17 ENCOUNTER — Encounter: Payer: Self-pay | Admitting: Internal Medicine

## 2022-01-17 ENCOUNTER — Other Ambulatory Visit: Payer: Self-pay | Admitting: Internal Medicine

## 2022-01-17 ENCOUNTER — Ambulatory Visit (INDEPENDENT_AMBULATORY_CARE_PROVIDER_SITE_OTHER): Payer: Managed Care, Other (non HMO) | Admitting: Internal Medicine

## 2022-01-17 DIAGNOSIS — Z6841 Body Mass Index (BMI) 40.0 and over, adult: Secondary | ICD-10-CM | POA: Diagnosis not present

## 2022-01-17 DIAGNOSIS — I10 Essential (primary) hypertension: Secondary | ICD-10-CM

## 2022-01-17 DIAGNOSIS — R739 Hyperglycemia, unspecified: Secondary | ICD-10-CM | POA: Diagnosis not present

## 2022-01-17 MED ORDER — METFORMIN HCL 500 MG PO TABS: 500.0000 mg | ORAL_TABLET | Freq: Every day | ORAL | 3 refills | Status: DC

## 2022-01-17 MED ORDER — VITAMIN D3 50 MCG (2000 UT) PO CAPS: 2000.0000 [IU] | ORAL_CAPSULE | Freq: Every day | ORAL | 3 refills | Status: DC

## 2022-01-17 MED ORDER — TIRZEPATIDE 7.5 MG/0.5ML ~~LOC~~ SOAJ: 7.5000 mg | SUBCUTANEOUS | 3 refills | Status: DC

## 2022-01-17 NOTE — Telephone Encounter (Signed)
Rec; msg stating " THIS IS NOT COVERED BY INSURANCE. need ALTERNATIVE.Marland Kitchen/LMB ?

## 2022-01-17 NOTE — Assessment & Plan Note (Signed)
Doing well ?On Mounjaro. Added Metformin ?

## 2022-01-17 NOTE — Assessment & Plan Note (Addendum)
Doing well at home and w/wt loss ?NASdiet. Off meds ?

## 2022-01-17 NOTE — Assessment & Plan Note (Signed)
Doing well at home and w/wt loss ?On Mounjaro. Added Metformin ?

## 2022-01-17 NOTE — Progress Notes (Signed)
? ?Subjective:  ?Patient ID: Angel Clayton, female    DOB: [birth year]  Age: 41 y.o. MRN: [id number] ? ?CC: No chief complaint on file. ? ? ?HPI ?Angel Clayton presents for elevated glucose, HTN, obesity ? ?Outpatient Medications Prior to Visit  ?Medication Sig Dispense Refill  ? tirzepatide (MOUNJARO) 5 MG/0.5ML Pen Inject 5 mg into the skin once a week. 6 mL 5  ? benzonatate (TESSALON) 100 MG capsule Take 1 capsule (100 mg total) by mouth 2 (two) times daily as needed for cough. 20 capsule 0  ? Cholecalciferol (VITAMIN D3) 50 MCG (2000 UT) capsule Take 1 capsule (2,000 Units total) by mouth daily. 100 capsule 3  ? famotidine (PEPCID) 40 MG tablet Take 1 tablet (40 mg total) by mouth daily. 90 tablet 3  ? ferrous sulfate 325 (65 FE) MG tablet Take 1 tablet (325 mg total) by mouth daily. 30 tablet 6  ? NIFEdipine (PROCARDIA XL/NIFEDICAL XL) 60 MG 24 hr tablet Take 1 tablet (60 mg total) by mouth daily. 90 tablet 3  ? norethindrone (MICRONOR) 0.35 MG tablet Take 1 tablet by mouth daily. Take 1 by mouth daily    ? ?No facility-administered medications prior to visit.  ? ? ?ROS: ?Review of Systems  ?Constitutional:  Negative for activity change, appetite change, chills, fatigue and unexpected weight change.  ?HENT:  Negative for congestion, mouth sores and sinus pressure.   ?Eyes:  Negative for visual disturbance.  ?Respiratory:  Negative for cough and chest tightness.   ?Gastrointestinal:  Negative for abdominal pain and nausea.  ?Genitourinary:  Negative for difficulty urinating, frequency and vaginal pain.  ?Musculoskeletal:  Negative for back pain and gait problem.  ?Skin:  Negative for pallor and rash.  ?Neurological:  Negative for dizziness, tremors, weakness, numbness and headaches.  ?Psychiatric/Behavioral:  Negative for confusion and sleep disturbance.   ? ?Objective:  ?BP (!) 138/98 (BP Location: Left Arm, Patient Position: Sitting, Cuff Size: Normal)   Pulse 79   Temp 97.9 ?F (36.6 ?C) (Oral)   Ht 5'  5.5" (1.664 m)   Wt 221 lb (100.2 kg)   LMP 01/16/2022   SpO2 91%   BMI 36.22 kg/m?  ? ?BP Readings from Last 3 Encounters:  ?01/17/22 (!) 138/98  ?09/26/21 130/80  ?08/21/21 136/84  ? ? ?Wt Readings from Last 3 Encounters:  ?01/17/22 221 lb (100.2 kg)  ?09/26/21 233 lb (105.7 kg)  ?08/21/21 237 lb 6.4 oz (107.7 kg)  ? ? ?Physical Exam ?Constitutional:   ?   General: She is not in acute distress. ?   Appearance: She is well-developed.  ?HENT:  ?   Head: Normocephalic.  ?   Right Ear: External ear normal.  ?   Left Ear: External ear normal.  ?   Nose: Nose normal.  ?Eyes:  ?   General:     ?   Right eye: No discharge.     ?   Left eye: No discharge.  ?   Conjunctiva/sclera: Conjunctivae normal.  ?   Pupils: Pupils are equal, round, and reactive to light.  ?Neck:  ?   Thyroid: No thyromegaly.  ?   Vascular: No JVD.  ?   Trachea: No tracheal deviation.  ?Cardiovascular:  ?   Rate and Rhythm: Normal rate and regular rhythm.  ?   Heart sounds: Normal heart sounds.  ?Pulmonary:  ?   Effort: No respiratory distress.  ?   Breath sounds: No stridor. No wheezing.  ?Abdominal:  ?  General: Bowel sounds are normal. There is no distension.  ?   Palpations: Abdomen is soft. There is no mass.  ?   Tenderness: There is no abdominal tenderness. There is no guarding or rebound.  ?Musculoskeletal:     ?   General: No tenderness.  ?   Cervical back: Normal range of motion and neck supple. No rigidity.  ?Lymphadenopathy:  ?   Cervical: No cervical adenopathy.  ?Skin: ?   Findings: No erythema or rash.  ?Neurological:  ?   Cranial Nerves: No cranial nerve deficit.  ?   Motor: No abnormal muscle tone.  ?   Coordination: Coordination normal.  ?   Deep Tendon Reflexes: Reflexes normal.  ?Psychiatric:     ?   Behavior: Behavior normal.     ?   Thought Content: Thought content normal.     ?   Judgment: Judgment normal.  ? ? ?Lab Results  ?Component Value Date  ? WBC 6.0 05/31/2021  ? HGB 10.1 (L) 05/31/2021  ? HCT 31.6 (L) 05/31/2021  ?  PLT 360.0 05/31/2021  ? GLUCOSE 86 05/31/2021  ? ALT 11 05/31/2021  ? AST 14 05/31/2021  ? NA 139 05/31/2021  ? K 4.2 05/31/2021  ? CL 105 05/31/2021  ? CREATININE 0.88 05/31/2021  ? BUN 11 05/31/2021  ? CO2 25 05/31/2021  ? TSH 1.46 05/31/2021  ? HGBA1C 5.7 05/31/2021  ? ? ?No results found. ? ?Assessment & Plan:  ? ?Problem List Items Addressed This Visit   ? ? Obesity  ?  Doing well ?On Mounjaro. Added Metformin ?  ?  ? Relevant Medications  ? metFORMIN (GLUCOPHAGE) 500 MG tablet  ? tirzepatide (MOUNJARO) 7.5 MG/0.5ML Pen  ? HTN (hypertension)  ?  Doing well at home and w/wt loss ?NASdiet. Off meds ?  ?  ? Hyperglycemia  ?  Doing well at home and w/wt loss ?On Mounjaro. Added Metformin ?  ?  ?  ? ? ?Meds ordered this encounter  ?Medications  ? Cholecalciferol (VITAMIN D3) 50 MCG (2000 UT) capsule  ?  Sig: Take 1 capsule (2,000 Units total) by mouth daily.  ?  Dispense:  100 capsule  ?  Refill:  3  ? metFORMIN (GLUCOPHAGE) 500 MG tablet  ?  Sig: Take 1 tablet (500 mg total) by mouth daily with breakfast.  ?  Dispense:  90 tablet  ?  Refill:  3  ? tirzepatide (MOUNJARO) 7.5 MG/0.5ML Pen  ?  Sig: Inject 7.5 mg into the skin once a week.  ?  Dispense:  6 mL  ?  Refill:  3  ?  ? ? ?Follow-up: Return in about 3 months (around 04/18/2022) for a follow-up visit. ? ?Sonda Primes, MD ?

## 2022-04-18 ENCOUNTER — Ambulatory Visit: Payer: Managed Care, Other (non HMO) | Admitting: Internal Medicine

## 2022-05-20 ENCOUNTER — Telehealth: Payer: Self-pay

## 2022-05-20 NOTE — Telephone Encounter (Signed)
For how long? Thx

## 2022-05-20 NOTE — Telephone Encounter (Signed)
Pt is requesting a Rx for Malaria as she is traveling to [place] next week. I offered her your first available but she declined at this time.  Pt CB [phone]

## 2022-05-21 NOTE — Telephone Encounter (Signed)
I called pt and she states she will be there 1 week.

## 2022-05-22 MED ORDER — ATOVAQUONE-PROGUANIL HCL 250-100 MG PO TABS: ORAL_TABLET | ORAL | 0 refills | Status: DC

## 2022-05-22 NOTE — Telephone Encounter (Signed)
Ok Malarone Thx

## 2022-05-22 NOTE — Telephone Encounter (Signed)
I called pt to inform her that the Rx has been sent to the pharmacy of her choice.

## 2022-06-28 ENCOUNTER — Institutional Professional Consult (permissible substitution): Payer: Self-pay | Admitting: Plastic Surgery

## 2022-07-10 ENCOUNTER — Ambulatory Visit (INDEPENDENT_AMBULATORY_CARE_PROVIDER_SITE_OTHER): Payer: Self-pay | Admitting: Plastic Surgery

## 2022-07-10 ENCOUNTER — Encounter: Payer: Self-pay | Admitting: Plastic Surgery

## 2022-07-10 VITALS — BP 149/93 | HR 77 | Ht 66.0 in | Wt 208.4 lb

## 2022-07-10 DIAGNOSIS — Z9884 Bariatric surgery status: Secondary | ICD-10-CM

## 2022-07-10 DIAGNOSIS — L987 Excessive and redundant skin and subcutaneous tissue: Secondary | ICD-10-CM

## 2022-07-10 NOTE — Progress Notes (Signed)
Referring Provider Plotnikov, Angel Quint, MD [address] [place],  [place] [address]   CC:  Chief Complaint  Patient presents with   Consult      Angel Clayton is an 41 y.o. female.  HPI: [name] is a very pleasant 41 year old female who presents to the clinic today for discussion of bilateral brachioplasty.  The patient has undergone gastric sleeve surgery and has been losing weight.  She did have weight gain during during her pregnancy with her son approximately 1 year ago however she is currently in the process of losing weight and is at a weight of 208 pounds today down 13 pounds since April 13.  No Known Allergies  Outpatient Encounter Medications as of 07/10/2022  Medication Sig   Cholecalciferol (VITAMIN D3) 50 MCG (2000 UT) capsule Take 1 capsule (2,000 Units total) by mouth daily.   metFORMIN (GLUCOPHAGE) 500 MG tablet Take 1 tablet (500 mg total) by mouth daily with breakfast.   tirzepatide (MOUNJARO) 7.5 MG/0.5ML Pen Inject 7.5 mg into the skin once a week.   [DISCONTINUED] atovaquone-proguanil (MALARONE) 250-100 MG TABS tablet Start 1 tablet a day one day prior to your trip.  Take 1 tablet daily while traveling.  Continue 1 tablet daily for 1 week after you returned, then stop.   No facility-administered encounter medications on file as of 07/10/2022.     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   Pilonidal cyst     Past Surgical History:  Procedure Laterality Date   BREAST REDUCTION SURGERY  2005   LAPAROSCOPIC GASTRIC SLEEVE RESECTION  2016   LAPAROSCOPY N/A 09/21/2019   Procedure: LAPAROSCOPY DIAGNOSTIC, removal of ectopic, PARTIAL LEFT OOPHERECTOMY;  Surgeon: Edwinna Areola, DO;  Location: [facility] OR;  Service: Gynecology;  Laterality: N/A;   UPPER GI ENDOSCOPY   09/06/2015   WISDOM TOOTH EXTRACTION  09/2019    Family History  Problem Relation Age of Onset   Hypertension Mother    Pseudotumor cerebri Mother    Hypertension Father     Social History   Social History Narrative   Lives with husband   Caffeine- 2 teas daily     Review of Systems General: Denies fevers, chills, weight loss CV: Denies chest pain, shortness of breath, palpitations Skin: Excess upper arm skin and fat  Physical Exam    07/10/2022   11:34 AM 01/17/2022    3:52 PM 09/26/2021    2:17 PM  Vitals with BMI  Height 5\' 6"  5' 5.5" 5' 5.5"  Weight 208 lbs 6 oz 221 lbs 233 lbs  BMI 33.65 36.2 38.17  Systolic 149 138  Diastolic 93 98 80  Pulse 77 79 90    General:  No acute distress,  Alert and oriented, Non-Toxic, Normal speech and affect Extremities: The patient has excess upper arm fat and skin on both arms while she does have a significant amount of excess skin she also has a lot of fat especially over the triceps region.  Does not have any motor or sensory deficits in either extremity.  Assessment/Plan Excess arm skin and fat bilaterally: [name] is requesting a brachioplasty. We discussed the procedure including the resultant very visible scars after this procedure for 30  minutes. She is not an ideal candidate due to the amount of fat that she has in the subcutaneous tissues of her arm.  We discussed at length strategies for continued weight loss including modest dietary changes 30 minutes of walking per day and 3 days of light weight lifting.  Formed her that she would get a infinitely superior aesthetic result with continued weight loss.  She understood and is quite happy to continue with her weight loss and physical fitness goals.  She will follow-up with me in 3 months to see how she is progressing.  She understands that this is an aesthetic procedure and as such will not be covered by insurance that there will be anesthetic fee for the surgery we will  provide her with the necessary information.  Camillia Herter 07/10/2022, 12:18 PM

## 2022-09-23 ENCOUNTER — Encounter: Payer: Self-pay | Admitting: Internal Medicine

## 2022-09-23 ENCOUNTER — Ambulatory Visit: Payer: Self-pay | Admitting: Internal Medicine

## 2022-09-23 VITALS — BP 160/120 | HR 80 | Temp 98.3°F | Ht 66.0 in | Wt 231.0 lb

## 2022-09-23 DIAGNOSIS — I10 Essential (primary) hypertension: Secondary | ICD-10-CM

## 2022-09-23 DIAGNOSIS — R051 Acute cough: Secondary | ICD-10-CM

## 2022-09-23 MED ORDER — PROMETHAZINE-DM 6.25-15 MG/5ML PO SYRP: 5.0000 mL | ORAL_SOLUTION | Freq: Four times a day (QID) | ORAL | 0 refills | Status: DC | PRN

## 2022-09-23 MED ORDER — PREDNISONE 20 MG PO TABS: 40.0000 mg | ORAL_TABLET | Freq: Every day | ORAL | 0 refills | Status: DC

## 2022-09-23 NOTE — Patient Instructions (Signed)
We have sent in promethazine/dm to use for the cough up to 2-3 times a day (mostly at night time).  We have sent in prednisone to take 2 pills daily for 5 days.

## 2022-09-23 NOTE — Progress Notes (Signed)
   Subjective:   Patient ID: Angel Clayton, female    DOB: 03/31/1981, 41 y.o.   MRN: [id number]  Cough Pertinent negatives include no chest pain or shortness of breath.   The patient is a 41 YO female coming in for 3 weeks of cough.  Review of Systems  Constitutional: Negative.   HENT: Negative.    Eyes: Negative.   Respiratory:  Positive for cough. Negative for chest tightness and shortness of breath.   Cardiovascular:  Negative for chest pain, palpitations and leg swelling.  Gastrointestinal:  Negative for abdominal distention, abdominal pain, constipation, diarrhea, nausea and vomiting.  Musculoskeletal: Negative.   Skin: Negative.   Neurological: Negative.   Psychiatric/Behavioral: Negative.      Objective:  Physical Exam Constitutional:      Appearance: She is well-developed.  HENT:     Head: Normocephalic and atraumatic.  Cardiovascular:     Rate and Rhythm: Normal rate and regular rhythm.  Pulmonary:     Effort: Pulmonary effort is normal. No respiratory distress.     Breath sounds: Normal breath sounds. No wheezing or rales.  Abdominal:     General: Bowel sounds are normal. There is no distension.     Palpations: Abdomen is soft.     Tenderness: There is no abdominal tenderness. There is no rebound.  Musculoskeletal:     Cervical back: Normal range of motion.  Skin:    General: Skin is warm and dry.  Neurological:     Mental Status: She is alert and oriented to person, place, and time.     Coordination: Coordination normal.     Vitals:   09/23/22 1035 09/23/22 1042  BP: (!) 160/120 (!) 160/120  Pulse: 80   Temp: 98.3 F (36.8 C)   TempSrc: Oral   SpO2: 97%   Weight: 231 lb (104.8 kg)   Height: 5\' 6"  (1.676 m)     Assessment & Plan:

## 2022-09-23 NOTE — Assessment & Plan Note (Signed)
BP high likely due to taking cold medication. Advised to get one safe for high BP. Monitor BP at home.

## 2022-09-23 NOTE — Assessment & Plan Note (Signed)
Suspect post viral cough. Rx prednisone 5 day and promethazine/dm cough medicine.

## 2022-10-09 ENCOUNTER — Ambulatory Visit: Payer: Self-pay | Admitting: Plastic Surgery

## 2022-11-04 ENCOUNTER — Telehealth: Payer: Self-pay

## 2022-11-04 NOTE — Progress Notes (Signed)
Outreach attempt made to schedule telephone appointment for medication management.  Patient identified as part of TNM: HTN in black/African American population.  Left message and sending MyChart message for patient to reach out and schedule.

## 2022-11-04 NOTE — Patient Instructions (Signed)
Angel Clayton,  I hope you are doing well and having a nice week so far.  I am reaching out on behalf of the population health pharmacy team with [facility] to see if we could schedule a time to review your medications via telephone appointment?  As a pharmacist, I go over your medications with you to discuss affordability, adherence, efficacy, and address any questions or concerns you may have.  This is a service that is no cost to you, either.  Please give me a call if you would like to schedule; my direct line is [phone].  Thank you and have a great day!  Paulina Fusi, PharmD, DPLA

## 2022-11-08 ENCOUNTER — Encounter: Payer: Self-pay | Admitting: Internal Medicine

## 2022-11-08 NOTE — Telephone Encounter (Signed)
Submitted PA w/ (Key: [id number]) information has been submitted and will be reviewed by [place].Marland KitchenJohny Chess

## 2022-11-11 NOTE — Telephone Encounter (Signed)
Rec'd determination on PA for [facility]. PA was DENIED it states "  I have reviewed the request to cover Mounjaro 7.5 mg/0.5 PEN INJCTR. The information submitted  did not meet the criteria necessary to approve this medication." Notified pt with denial letter.Marland KitchenJohny Chess

## 2022-11-12 ENCOUNTER — Encounter: Payer: Self-pay | Admitting: Pharmacist

## 2022-11-18 NOTE — Telephone Encounter (Signed)
  Pharmacy Patient Advocate Encounter  Received notification from [place] that the request for prior authorization for [facility] 7.5MG /0.5 PEN INJCTR.  has been denied due to SEE ATTACHMENT IN MEDIA .    Please be advised we currently do not have a Pharmacist to review denials, therefore you will need to process appeals accordingly as needed. Thanks for your support at this time.   Sandre Kitty Rx Patient Advocate

## 2022-11-19 ENCOUNTER — Ambulatory Visit (INDEPENDENT_AMBULATORY_CARE_PROVIDER_SITE_OTHER): Payer: Commercial Managed Care - HMO | Admitting: Internal Medicine

## 2022-11-19 ENCOUNTER — Encounter: Payer: Self-pay | Admitting: Internal Medicine

## 2022-11-19 VITALS — BP 125/80 | HR 75 | Temp 98.1°F | Ht 66.0 in | Wt 236.4 lb

## 2022-11-19 DIAGNOSIS — Z6841 Body Mass Index (BMI) 40.0 and over, adult: Secondary | ICD-10-CM

## 2022-11-19 DIAGNOSIS — R739 Hyperglycemia, unspecified: Secondary | ICD-10-CM

## 2022-11-19 DIAGNOSIS — R030 Elevated blood-pressure reading, without diagnosis of hypertension: Secondary | ICD-10-CM | POA: Insufficient documentation

## 2022-11-19 LAB — COMPREHENSIVE METABOLIC PANEL
ALT: 13 U/L (ref 0–35)
AST: 16 U/L (ref 0–37)
Albumin: 4.1 g/dL (ref 3.5–5.2)
Alkaline Phosphatase: 59 U/L (ref 39–117)
BUN: 12 mg/dL (ref 6–23)
CO2: 28 mEq/L (ref 19–32)
Calcium: 9 mg/dL (ref 8.4–10.5)
Chloride: 103 mEq/L (ref 96–112)
Creatinine, Ser: 0.89 mg/dL (ref 0.40–1.20)
GFR: 81.59 mL/min (ref >60.00)
Glucose, Bld: 88 mg/dL (ref 70–99)
Potassium: 4.7 mEq/L (ref 3.5–5.1)
Sodium: 137 mEq/L (ref 135–145)
Total Bilirubin: 0.4 mg/dL (ref 0.2–1.2)
Total Protein: 7.4 g/dL (ref 6.0–8.3)

## 2022-11-19 LAB — HEMOGLOBIN A1C: Hgb A1c MFr Bld: 5.7 % (ref 4.6–6.5)

## 2022-11-19 MED ORDER — PHENTERMINE HCL 37.5 MG PO CAPS: 37.5000 mg | ORAL_CAPSULE | ORAL | 2 refills | Status: DC

## 2022-11-19 NOTE — Assessment & Plan Note (Signed)
Off Mounjaro. Off Metformin

## 2022-11-19 NOTE — Assessment & Plan Note (Signed)
NAS diet 

## 2022-11-19 NOTE — Progress Notes (Signed)
Subjective:  Patient ID: Angel Clayton, female    DOB: April 22, 1981  Age: 42 y.o. MRN: [id number]  CC: No chief complaint on file.   HPI Angel Clayton presents for hyperglycemia, wt gain Off Mounjaro since August  Outpatient Medications Prior to Visit  Medication Sig Dispense Refill   Cholecalciferol (VITAMIN D3) 50 MCG (2000 UT) capsule Take 1 capsule (2,000 Units total) by mouth daily. (Patient not taking: Reported on 09/23/2022) 100 capsule 3   metFORMIN (GLUCOPHAGE) 500 MG tablet Take 1 tablet (500 mg total) by mouth daily with breakfast. (Patient not taking: Reported on 09/23/2022) 90 tablet 3   predniSONE (DELTASONE) 20 MG tablet Take 2 tablets (40 mg total) by mouth daily with breakfast. (Patient not taking: Reported on 11/19/2022) 10 tablet 0   promethazine-dextromethorphan (PROMETHAZINE-DM) 6.25-15 MG/5ML syrup Take 5 mLs by mouth 4 (four) times daily as needed. (Patient not taking: Reported on 11/19/2022) 118 mL 0   tirzepatide (MOUNJARO) 7.5 MG/0.5ML Pen Inject 7.5 mg into the skin once a week. (Patient not taking: Reported on 09/23/2022) 6 mL 3   No facility-administered medications prior to visit.    ROS: Review of Systems  Constitutional:  Positive for unexpected weight change. Negative for activity change, appetite change, chills and fatigue.  HENT:  Negative for congestion, mouth sores and sinus pressure.   Eyes:  Negative for visual disturbance.  Respiratory:  Negative for cough and chest tightness.   Gastrointestinal:  Negative for abdominal pain and nausea.  Genitourinary:  Negative for difficulty urinating, frequency and vaginal pain.  Musculoskeletal:  Negative for back pain and gait problem.  Skin:  Negative for pallor and rash.  Neurological:  Negative for dizziness, tremors, weakness, numbness and headaches.  Psychiatric/Behavioral:  Negative for confusion and sleep disturbance.     Objective:  BP 125/80   Pulse 75   Temp 98.1 F (36.7 C) (Oral)    Ht 5' 6"$  (1.676 m)   Wt 236 lb 6.4 oz (107.2 kg)   SpO2 100%   BMI 38.16 kg/m   BP Readings from Last 3 Encounters:  11/19/22 125/80  09/23/22 (!) 160/120  07/10/22 (!) 149/93    Wt Readings from Last 3 Encounters:  11/19/22 236 lb 6.4 oz (107.2 kg)  09/23/22 231 lb (104.8 kg)  07/10/22 208 lb 6.4 oz (94.5 kg)    Physical Exam Constitutional:      General: She is not in acute distress.    Appearance: She is well-developed.  HENT:     Head: Normocephalic.     Right Ear: External ear normal.     Left Ear: External ear normal.     Nose: Nose normal.  Eyes:     General:        Right eye: No discharge.        Left eye: No discharge.     Conjunctiva/sclera: Conjunctivae normal.     Pupils: Pupils are equal, round, and reactive to light.  Neck:     Thyroid: No thyromegaly.     Vascular: No JVD.     Trachea: No tracheal deviation.  Cardiovascular:     Rate and Rhythm: Normal rate and regular rhythm.     Heart sounds: Normal heart sounds.  Pulmonary:     Effort: No respiratory distress.     Breath sounds: No stridor. No wheezing.  Abdominal:     General: Bowel sounds are normal. There is no distension.     Palpations: Abdomen is soft. There  is no mass.     Tenderness: There is no abdominal tenderness. There is no guarding or rebound.  Musculoskeletal:        General: No tenderness.     Cervical back: Normal range of motion and neck supple. No rigidity.  Lymphadenopathy:     Cervical: No cervical adenopathy.  Skin:    Findings: No erythema or rash.  Neurological:     Cranial Nerves: No cranial nerve deficit.     Motor: No abnormal muscle tone.     Coordination: Coordination normal.     Deep Tendon Reflexes: Reflexes normal.  Psychiatric:        Behavior: Behavior normal.        Thought Content: Thought content normal.        Judgment: Judgment normal.     Lab Results  Component Value Date   WBC 6.0 05/31/2021   HGB 10.1 (L) 05/31/2021   HCT 31.6 (L)  05/31/2021   PLT 360.0 05/31/2021   GLUCOSE 86 05/31/2021   ALT 11 05/31/2021   AST 14 05/31/2021   NA 139 05/31/2021   K 4.2 05/31/2021   CL 105 05/31/2021   CREATININE 0.88 05/31/2021   BUN 11 05/31/2021   CO2 25 05/31/2021   TSH 1.46 05/31/2021   HGBA1C 5.7 05/31/2021    No results found.  Assessment & Plan:   Problem List Items Addressed This Visit       Other   Obesity    Worse Was on Mounjaro. Off Metformin      Relevant Medications   phentermine 37.5 MG capsule   Hyperglycemia - Primary    Off Mounjaro. Off Metformin      Relevant Orders   Comprehensive metabolic panel   Hemoglobin A1c   Elevated BP without diagnosis of hypertension    NAS diet         Meds ordered this encounter  Medications   phentermine 37.5 MG capsule    Sig: Take 1 capsule (37.5 mg total) by mouth every morning.    Dispense:  30 capsule    Refill:  2      Follow-up: Return in about 3 months (around 02/17/2023) for a follow-up visit.  Walker Kehr, MD

## 2022-11-19 NOTE — Assessment & Plan Note (Addendum)
Worse Was on Mounjaro. Off Metformin

## 2022-12-27 ENCOUNTER — Other Ambulatory Visit: Payer: Self-pay | Admitting: Internal Medicine

## 2023-03-14 ENCOUNTER — Other Ambulatory Visit: Payer: Self-pay | Admitting: Internal Medicine

## 2023-06-05 ENCOUNTER — Encounter: Payer: Self-pay | Admitting: Gynecology

## 2023-06-05 ENCOUNTER — Other Ambulatory Visit: Payer: Self-pay | Admitting: Gynecology

## 2023-06-05 DIAGNOSIS — N6314 Unspecified lump in the right breast, lower inner quadrant: Secondary | ICD-10-CM

## 2023-06-11 ENCOUNTER — Ambulatory Visit: Payer: Medicaid Other | Admitting: Internal Medicine

## 2023-06-18 ENCOUNTER — Encounter: Payer: Self-pay | Admitting: Internal Medicine

## 2023-06-18 ENCOUNTER — Ambulatory Visit: Payer: Medicaid Other | Admitting: Internal Medicine

## 2023-06-20 ENCOUNTER — Other Ambulatory Visit: Payer: Medicaid Other

## 2023-08-15 ENCOUNTER — Encounter: Payer: Self-pay | Admitting: Internal Medicine

## 2023-08-15 ENCOUNTER — Ambulatory Visit (INDEPENDENT_AMBULATORY_CARE_PROVIDER_SITE_OTHER): Payer: Self-pay | Admitting: Internal Medicine

## 2023-08-15 VITALS — BP 128/76 | HR 62 | Temp 98.6°F | Ht 66.0 in | Wt 249.0 lb

## 2023-08-15 DIAGNOSIS — H1032 Unspecified acute conjunctivitis, left eye: Secondary | ICD-10-CM

## 2023-08-15 MED ORDER — POLYMYXIN B-TRIMETHOPRIM 10000-0.1 UNIT/ML-% OP SOLN: 1.0000 [drp] | OPHTHALMIC | 0 refills | Status: DC

## 2023-08-15 NOTE — Patient Instructions (Addendum)
      Medications changes include :   antibacterial eye drops    Return if symptoms worsen or fail to improve.

## 2023-08-15 NOTE — Progress Notes (Signed)
    Subjective:    Patient ID: Angel Clayton, female    DOB: 1981/01/07, 42 y.o.   MRN: [id number]      HPI [name] is here for  Chief Complaint  Patient presents with   Conjunctivitis    Left eye (itchy; patient notes crust around eye yesterday)     Woke up yesterday - left eye was stuck shut, it was watery, itchy and there was white discharge in the medial corner of the eye.  This morning was the same.  The eye is slightly pink.  She denies any changes in vision.  She has a two year old who goes to daycare.  She has had a mild cold symptoms.      Medications and allergies reviewed with patient and updated if appropriate.  Current Outpatient Medications on File Prior to Visit  Medication Sig Dispense Refill   Cholecalciferol (VITAMIN D3) 50 MCG (2000 UT) capsule Take 1 capsule (2,000 Units total) by mouth daily. (Patient not taking: Reported on 09/23/2022) 100 capsule 3   phentermine 37.5 MG capsule TAKE 1 CAPSULE BY MOUTH EVERY MORNING (Patient not taking: Reported on 08/15/2023) 30 capsule 0   No current facility-administered medications on file prior to visit.    Review of Systems     Objective:   Vitals:   08/15/23 1011  BP: 128/76  Pulse: 62  Temp: 98.6 F (37 C)  SpO2: 100%   BP Readings from Last 3 Encounters:  08/15/23 128/76  11/19/22 125/80  09/23/22 (!) 160/120   Wt Readings from Last 3 Encounters:  08/15/23 249 lb (112.9 kg)  11/19/22 236 lb 6.4 oz (107.2 kg)  09/23/22 231 lb (104.8 kg)   Body mass index is 40.19 kg/m.    Physical Exam Constitutional:      General: She is not in acute distress.    Appearance: Normal appearance. She is not ill-appearing.  HENT:     Head: Normocephalic and atraumatic.  Eyes:     General: No scleral icterus.       Right eye: No discharge.        Left eye: No discharge.     Extraocular Movements: Extraocular movements intact.     Pupils: Pupils are equal, round, and reactive to light.     Comments:  Left conjunctiva mildly erythematous.  Right conjunctiva normal.  No eyelid swelling or erythema  Musculoskeletal:     Cervical back: Neck supple. No tenderness.  Lymphadenopathy:     Cervical: No cervical adenopathy.  Skin:    General: Skin is warm and dry.     Findings: No erythema or rash.  Neurological:     Mental Status: She is alert.            Assessment & Plan:    Left eye conjunctivitis: Acute Difficult to know whether it is truly viral or bacterial Will go ahead and start Polytrim eyedrops 4 times a day to left eye Advised warm compresses Discussed that it is contagious precautions to take Call if no improvement or with questions

## 2023-11-18 DIAGNOSIS — H4711 Papilledema associated with increased intracranial pressure: Secondary | ICD-10-CM | POA: Diagnosis not present

## 2023-11-25 ENCOUNTER — Other Ambulatory Visit: Payer: Self-pay

## 2023-11-25 ENCOUNTER — Emergency Department (HOSPITAL_COMMUNITY): Payer: BC Managed Care – PPO

## 2023-11-25 ENCOUNTER — Emergency Department (HOSPITAL_COMMUNITY)
Admission: EM | Admit: 2023-11-25 | Discharge: 2023-11-26 | Disposition: A | Discharge status: Home / Self Care | Payer: BC Managed Care – PPO | Attending: Emergency Medicine | Admitting: Emergency Medicine

## 2023-11-25 ENCOUNTER — Encounter (HOSPITAL_COMMUNITY): Payer: Self-pay

## 2023-11-25 DIAGNOSIS — R930 Abnormal findings on diagnostic imaging of skull and head, not elsewhere classified: Secondary | ICD-10-CM | POA: Diagnosis not present

## 2023-11-25 DIAGNOSIS — G932 Benign intracranial hypertension: Secondary | ICD-10-CM | POA: Diagnosis not present

## 2023-11-25 DIAGNOSIS — H4711 Papilledema associated with increased intracranial pressure: Secondary | ICD-10-CM | POA: Diagnosis not present

## 2023-11-25 DIAGNOSIS — H538 Other visual disturbances: Secondary | ICD-10-CM | POA: Diagnosis not present

## 2023-11-25 DIAGNOSIS — H5789 Other specified disorders of eye and adnexa: Secondary | ICD-10-CM | POA: Diagnosis not present

## 2023-11-25 DIAGNOSIS — H4710 Unspecified papilledema: Secondary | ICD-10-CM | POA: Diagnosis not present

## 2023-11-25 DIAGNOSIS — R2681 Unsteadiness on feet: Secondary | ICD-10-CM | POA: Diagnosis not present

## 2023-11-25 LAB — COMPREHENSIVE METABOLIC PANEL
ALT: 14 U/L (ref 0–44)
AST: 16 U/L (ref 15–41)
Albumin: 3.5 g/dL (ref 3.5–5.0)
Alkaline Phosphatase: 67 U/L (ref 38–126)
Anion gap: 6 (ref 5–15)
BUN: 13 mg/dL (ref 6–20)
CO2: 25 mmol/L (ref 22–32)
Calcium: 8.4 mg/dL — ABNORMAL LOW (ref 8.9–10.3)
Chloride: 104 mmol/L (ref 98–111)
Creatinine, Ser: 0.83 mg/dL (ref 0.44–1.00)
GFR, Estimated: >60 mL/min (ref >60)
Glucose, Bld: 91 mg/dL (ref 70–99)
Potassium: 4 mmol/L (ref 3.5–5.1)
Sodium: 135 mmol/L (ref 135–145)
Total Bilirubin: 0.3 mg/dL (ref 0.0–1.2)
Total Protein: 7.3 g/dL (ref 6.5–8.1)

## 2023-11-25 LAB — CBC WITH DIFFERENTIAL/PLATELET
Abs Immature Granulocytes: 0.03 10*3/uL (ref 0.00–0.07)
Basophils Absolute: 0 10*3/uL (ref 0.0–0.1)
Basophils Relative: 0 %
Eosinophils Absolute: 0.1 10*3/uL (ref 0.0–0.5)
Eosinophils Relative: 2 %
HCT: 32.1 % — ABNORMAL LOW (ref 36.0–46.0)
Hemoglobin: 10.1 g/dL — ABNORMAL LOW (ref 12.0–15.0)
Immature Granulocytes: 0 %
Lymphocytes Relative: 31 %
Lymphs Abs: 2.7 10*3/uL (ref 0.7–4.0)
MCH: 29.2 pg (ref 26.0–34.0)
MCHC: 31.5 g/dL (ref 30.0–36.0)
MCV: 92.8 fL (ref 80.0–100.0)
Monocytes Absolute: 0.7 10*3/uL (ref 0.1–1.0)
Monocytes Relative: 8 %
Neutro Abs: 5.2 10*3/uL (ref 1.7–7.7)
Neutrophils Relative %: 59 %
Platelets: 341 10*3/uL (ref 150–400)
RBC: 3.46 MIL/uL — ABNORMAL LOW (ref 3.87–5.11)
RDW: 14.5 % (ref 11.5–15.5)
WBC: 8.8 10*3/uL (ref 4.0–10.5)
nRBC: 0 % (ref 0.0–0.2)

## 2023-11-25 LAB — HCG, SERUM, QUALITATIVE: Preg, Serum: NEGATIVE

## 2023-11-25 MED ORDER — LIDOCAINE HCL (PF) 1 % IJ SOLN
5.0000 mL | Freq: Once | INTRAMUSCULAR | Status: AC
Start: 2023-11-25 — End: 2023-11-25
  Administered 2023-11-25: 5 mL
  Filled 2023-11-25: qty 30

## 2023-11-25 MED ORDER — GADOBUTROL 1 MMOL/ML IV SOLN
10.0000 mL | Freq: Once | INTRAVENOUS | Status: AC | PRN
  Administered 2023-11-25: 10 mL via INTRAVENOUS

## 2023-11-25 MED ORDER — LORAZEPAM 2 MG/ML IJ SOLN
1.0000 mg | Freq: Once | INTRAMUSCULAR | Status: AC
  Administered 2023-11-25: 1 mg via INTRAVENOUS
  Filled 2023-11-25: qty 1

## 2023-11-25 NOTE — ED Notes (Signed)
 Pt ambulated to the bathroom w/out any difficulties.

## 2023-11-25 NOTE — ED Provider Notes (Signed)
Angel Clayton AT Angel Clayton Provider Note   CSN: [id number] Arrival date & time: 11/25/23  1652     History {Add pertinent medical, surgical, social history, OB history to HPI:1} Chief Complaint  Patient presents with   Eye Problem    Angel Clayton is a 43 y.o. female with past medical history of idiopathic intracranial pressure, papilledema presents to emergency Clayton from ophthalmology office for papilledema.  Dr. Hanley Ben recommended MRI brain and MRI orbits, LP for severe papilledema of both optic nerves  Had this in 2019. Had LP followed up with Angel Clayton in 2020 - come back if necessary. Angel Clayton  Prior to office visit today, has been complaining of blurred vision and "hazy" worse on the right side for the past 2 weeks.  On Sunday, she felt that she was tilted to the right side and generally off balance.  Currently complains of 2 out of 10 headache that is located behind eyes and in the frontal regions.  Has an appointment with Angel Clayton Neurology on 12/12/23   The history is provided by the patient and medical records.  Eye Problem Associated symptoms: no headaches, no nausea, no numbness, no vomiting and no weakness       Home Medications Prior to Admission medications   Medication Sig Start Date End Date Taking? Authorizing Provider  Cholecalciferol (VITAMIN D3) 50 MCG (2000 UT) capsule Take 1 capsule (2,000 Units total) by mouth daily. Patient not taking: Reported on 09/23/2022 01/17/22   Plotnikov, Georgina Quint, MD  phentermine 37.5 MG capsule TAKE 1 CAPSULE BY MOUTH EVERY MORNING Patient not taking: Reported on 08/15/2023 03/21/23   Plotnikov, Georgina Quint, MD  trimethoprim-polymyxin b (POLYTRIM) ophthalmic solution Place 1 drop into the left eye every 4 (four) hours. Use for 5 days 08/15/23   Pincus Sanes, MD      Allergies    Patient has no known allergies.    Review of Systems   Review of Systems  Constitutional:  Negative for  chills, fatigue and fever.  Respiratory:  Negative for cough, chest tightness, shortness of breath and wheezing.   Cardiovascular:  Negative for chest pain and palpitations.  Gastrointestinal:  Negative for abdominal pain, constipation, diarrhea, nausea and vomiting.  Neurological:  Negative for dizziness, seizures, weakness, light-headedness, numbness and headaches.    Physical Exam Updated Vital Signs BP (!) 165/131   Pulse 71   Temp 98 F (36.7 C) (Oral)   Resp 16   Ht 5\' 6"  (1.676 m)   Wt 106.6 kg   SpO2 100%   BMI 37.93 kg/m  Physical Exam Vitals and nursing note reviewed.  Constitutional:      General: She is not in acute distress.    Appearance: Normal appearance.  HENT:     Head: Normocephalic and atraumatic.  Eyes:     Conjunctiva/sclera: Conjunctivae normal.  Cardiovascular:     Rate and Rhythm: Normal rate.  Pulmonary:     Effort: Pulmonary effort is normal. No respiratory distress.  Musculoskeletal:     Right lower leg: No edema.     Left lower leg: No edema.  Skin:    Coloration: Skin is not jaundiced or pale.  Neurological:     Mental Status: She is alert. Mental status is at baseline.     ED Results / Procedures / Treatments   Labs (all labs ordered are listed, but only abnormal results are displayed) Labs Reviewed - No data to display  EKG None  Radiology No results found.  Procedures Procedures  {Document cardiac monitor, telemetry assessment procedure when appropriate:1}  Medications Ordered in ED Medications - No data to display  ED Course/ Medical Decision Making/ A&P   {   Click here for ABCD2, HEART and other calculatorsREFRESH Note before signing :1}                              Medical Decision Making Amount and/or Complexity of Data Reviewed Labs: ordered. Radiology: ordered.  Risk Prescription drug management.     Patient presents to the ED for concern of ***, this involves an extensive number of treatment options,  and is a complaint that carries with it a high risk of complications and morbidity.  The differential diagnosis includes ***   Co morbidities that complicate the patient evaluation  ***   Additional history obtained:  Additional history obtained from *** {Blank multiple:19196::"EMS","Family","Nursing","Outside Medical Records","Past Admission"}   External records from outside source obtained and reviewed including ***   Lab Tests:  I Ordered, and personally interpreted labs.  The pertinent results include:  ***   Imaging Studies ordered:  I ordered imaging studies including ***  I independently visualized and interpreted imaging which showed *** I agree with the radiologist interpretation   Cardiac Monitoring:  The patient was maintained on a cardiac monitor.  I personally viewed and interpreted the cardiac monitored which showed an underlying rhythm of: ***   Medicines ordered and prescription drug management:  I ordered medication including ***  for ***  Reevaluation of the patient after these medicines showed that the patient {resolved/improved/worsened:23923::"improved"} I have reviewed the patients home medicines and have made adjustments as needed    Critical Interventions:  ***   Consultations Obtained:  I requested consultation with the ***,  and discussed lab and imaging findings as well as pertinent plan - they recommend: ***   Problem List / ED Course:  Papilledema   Reevaluation:  After the interventions noted above, I reevaluated the patient and found that they have :stayed the same    Dispostion:  After consideration of the diagnostic results and the patients response to treatment, I feel that the patent would benefit from ***.    {Document critical care time when appropriate:1} {Document review of labs and clinical decision tools ie heart score, Chads2Vasc2 etc:1}  {Document your independent review of radiology images, and any outside  records:1} {Document your discussion with family members, caretakers, and with consultants:1} {Document social determinants of health affecting pt's care:1} {Document your decision making why or why not admission, treatments were needed:1} Final Clinical Impression(s) / ED Diagnoses Final diagnoses:  None    Rx / DC Orders ED Discharge Orders     None

## 2023-11-25 NOTE — ED Triage Notes (Signed)
Pt sent by ophthalmologist for decreased vision and increased eye pressure in the right eye over the last week. Recommended for MRI of the brain and orbits and for a lumbar puncture to decrease spinal pressure.

## 2023-11-25 NOTE — ED Notes (Signed)
Provider at bedside, LP in progress.

## 2023-11-26 ENCOUNTER — Ambulatory Visit: Payer: Self-pay | Admitting: Neurology

## 2023-11-26 MED ORDER — ACETAZOLAMIDE 250 MG PO TABS: 500.0000 mg | ORAL_TABLET | Freq: Two times a day (BID) | ORAL | 0 refills | Status: DC

## 2023-11-26 MED ORDER — NAPROXEN 500 MG PO TABS
500.0000 mg | ORAL_TABLET | Freq: Once | ORAL | Status: AC
  Administered 2023-11-26: 500 mg via ORAL
  Filled 2023-11-26: qty 1

## 2023-11-26 MED ORDER — ACETAZOLAMIDE ER 500 MG PO CP12
500.0000 mg | ORAL_CAPSULE | Freq: Two times a day (BID) | ORAL | Status: DC
  Administered 2023-11-26: 500 mg via ORAL
  Filled 2023-11-26: qty 1

## 2023-11-26 NOTE — Discharge Instructions (Addendum)
Thank you for letting us evaluate you today.  I sent Diamox to your pharmacy to pick up tomorrow.  We have given you 1 dose here in the emergency department.  Please make sure to follow-up with your ophthalmologist tomorrow  to schedule lumbar puncture and further management

## 2023-11-26 NOTE — Progress Notes (Unsigned)
Initial neurology clinic note  Angel Clayton MRN: [id number] DOB: 05-20-1981  Referring provider: Tresa Garter, MD  Primary care provider: Tresa Garter, MD  Reason for consult:  IIH, severe papilledema  Subjective:  This is Ms. Angel Clayton, a 43 y.o. right-handed female with a medical history of IIH, obesity s/p gastric sleeve surgery who presents to neurology clinic with blurred vision. The patient is accompanied by young son.  Patient has had worsening blurry vision since ~07/2023. She thought she just needed glasses. It has slowly gotten worse. She lost vision in her right eye in 10/2022. She has known IIH and been seen at [facility] and [facility] in the past. She was previously on Diamox in the past. She stopped this in 2020 though as she did not have symptoms and had lost weight. She endorses gaining back weight more recently.  Patient saw Dr. Hanley Ben at [facility] on 11/18/23. She was found to have severe papilledema on exam. She returned on 11/25/23 and had worse vision. She was then sent to the ED on 11/25/23 for MRI and LP. LP was attempted in the ED but unsuccessful. 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 Optic nerves appeared normal.  Patient was restarted on Diamox last night. She is taking 500 mg BID. She has taken 2 doses so far. She is not sure there is a big difference yet, but maybe a little. She has a metallic taste when she drinks soda, but no other side effects.  Currently, she thinks the left eye is working well but the right eye is blurry. She has a headache about every 2 days. It is not as bad as she first was diagnosed. It is about 6/10. It is more on the left side of her head than right. She will take tylenol and symptoms will resolve (she takes a couple of times per week).  She is also taking zinc and vit D.  Patient was seen by Dr. Marjory Lies at [facility] on 09/23/2018 for IIH and migraines and was on Diamox at that time  500 mg BID. Per note, LP on 09/17/18 showed an opening pressure of 25 cm of H2O.  Also seen at [facility] ophtho in 2020 and was on Diamox 250 mg BID at that time - mentions having tingling of tongue  EtOH use: occasional wine Family history: Mom and 2 cousins all have IIH  MEDICATIONS:  Outpatient Encounter Medications as of 11/27/2023  Medication Sig   acetaZOLAMIDE (DIAMOX) 250 MG tablet Take 2 tablets (500 mg total) by mouth 2 (two) times daily for 20 days.   [DISCONTINUED] Cholecalciferol (VITAMIN D3) 50 MCG (2000 UT) capsule Take 1 capsule (2,000 Units total) by mouth daily. (Patient not taking: Reported on 11/27/2023)   [DISCONTINUED] phentermine 37.5 MG capsule TAKE 1 CAPSULE BY MOUTH EVERY MORNING (Patient not taking: Reported on 11/27/2023)   [DISCONTINUED] trimethoprim-polymyxin b (POLYTRIM) ophthalmic solution Place 1 drop into the left eye every 4 (four) hours. Use for 5 days (Patient not taking: Reported on 11/27/2023)   No facility-administered encounter medications on file as of 11/27/2023.    PAST MEDICAL HISTORY: 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 [facility]   Hx of breast reduction, elective    Hypertension    OBESITY 05/16/2007   Papilledema of both eyes 09/2018   Pilonidal cyst     PAST SURGICAL  HISTORY: Past Surgical History:  Procedure Laterality Date   BREAST REDUCTION SURGERY  2005   LAPAROSCOPIC GASTRIC SLEEVE RESECTION  2016   LAPAROSCOPY N/A 09/21/2019   Procedure: LAPAROSCOPY DIAGNOSTIC, removal of ectopic, PARTIAL LEFT OOPHERECTOMY;  Surgeon: Edwinna Areola, DO;  Location: [facility] OR;  Service: Gynecology;  Laterality: N/A;   UPPER GI ENDOSCOPY  09/06/2015   WISDOM TOOTH EXTRACTION  09/2019    ALLERGIES: No Known Allergies  FAMILY HISTORY: Family History  Problem Relation Age of Onset   Hypertension Mother    Pseudotumor cerebri Mother    Hypertension Father     SOCIAL  HISTORY: Social History   Tobacco Use   Smoking status: Never   Smokeless tobacco: Never  Vaping Use   Vaping status: Never Used  Substance Use Topics   Alcohol use: Yes    Comment: Occasional Wine   Drug use: Never   Social History   Social History Narrative   Lives with husband   Caffeine- 2 teas daily      Are you right handed or left handed? Right Handed    Are you currently employed ? 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 No   Who lives with you?  Husband and son Almeta Monas       What type of home do you live in: 1 story or 2 story? One story home        Objective:  Vital Signs:  BP 136/80   Pulse 85   Ht 5\' 6"  (1.676 m)   Wt 248 lb (112.5 kg)   SpO2 100%   BMI 40.03 kg/m   General: No acute distress.  Patient appears well-groomed.   Head:  Normocephalic/atraumatic Eyes:  fundi examined, disc margins hazy, particularly in temporal margins, consistent with papilledema Neck: supple Heart: regular rate and rhythm Lungs: Clear to auscultation bilaterally. Vascular: No carotid bruits.  Neurological Exam: Mental status: alert and oriented, speech fluent and not dysarthric, language intact.  Cranial nerves: CN I: not tested CN II: pupils equal, round and reactive to light, visual fields intact. Red desaturation in right eye. 20/20 OS, 20/30 OD CN III, IV, VI:  full range of motion, no nystagmus, no ptosis CN V: facial sensation intact. CN VII: upper and lower face symmetric CN VIII: hearing intact CN IX, X: uvula midline CN XI: sternocleidomastoid and trapezius muscles intact CN XII: tongue midline  Bulk & Tone: normal, no fasciculations. Motor:  muscle strength 5/5 throughout Deep Tendon Reflexes:  2+ throughout.   Sensation:  Light touch sensation intact. Finger to nose testing:  Without dysmetria.    Gait:  Normal station and stride.  Romberg negative.   Labs and Imaging review: Internal  labs: 11/25/23: hCG negative CMP unremarkable CBC w/ diff significant for Hb 10.1 (chronic), MCV 92.8 HbA1c: 5.7  05/31/21: Ferritin: 4 B12: 559 TSH wnl Vit D 12.36  Imaging/Procedures: MRI brain and orbits w/wo contrast (11/25/23): Per my read: Expanded sella  Per radiology: FINDINGS: MRI HEAD FINDINGS   Brain: Diffusion imaging does not show any acute or subacute infarction or other cause of restricted diffusion. There is an enlarged sella with arachnoid herniation, which can be seen in the setting of intracranial hypertension. This is progressive since 2019, further suggesting this diagnosis. The brain parenchyma itself is normal without evidence of malformation, atrophy, old or acute infarction, mass lesion, hemorrhage, hydrocephalus or extra-axial collection. After contrast administration, no  abnormal enhancement occurs.   Vascular: Major vessels at the base of the brain show flow.   Skull and upper cervical spine: Negative otherwise   Other: None   MRI ORBITS FINDINGS   Orbits: Both globes appear normal. Optic nerves are normal by MRI. Optic nerve sheaths appear normal. No evidence of abnormal T2 signal or contrast enhancement. Orbital fat is normal. Extra-ocular muscles are normal. Lacrimal glands are normal.   Visualized sinuses: Normal and clear.   Soft tissues: Other regional soft tissues appear normal.   IMPRESSION: 1. Enlarged sella with arachnoid herniation, which can be seen in the setting of intracranial hypertension. This is progressive since 2019, further suggesting this diagnosis. 2. Otherwise normal appearance of the brain itself. 3. Normal appearance of the orbits. No evidence of optic neuritis by MRI.  LP (09/17/2018): Opening pressure 25 cm of H2O  Assessment/Plan:  DONNALYNN WHEELESS is a 43 y.o. female who presents for evaluation of headaches, blurry vision, and right eye vision loss episodes. She has a relevant medical history of IIH and  obesity s/p gastric sleeve surgery. Her neurological examination is pertinent for red desaturation and mildly decreased visual acuity in the right eye and bilateral papilledema. Available diagnostic data is significant for MRI brain with expanded sella with arachnoid herniation. LP in 2019 showed an opening pressure of 25 cm of H20. Patient's symptoms are consistent with IIH, now with visual symptoms. She had previously been on diamox but stopped in 2020 as her symptoms had improved. She was put back on Diamox 500 mg BID, but would benefit from urgent LP to help relieve pressure and preserve as much vision as possible. Unfortunately, patient went to the ED and LP was unsuccessful. I will attempt to get patient into LP under fluoro ASAP.  PLAN: -Blood work: B1, B12, TSH -Urgent LP with opening pressure and high volume tap -Continue Diamox 500 mg BID -Continue vit D supplementation  -Return to clinic in 1 month  The impression above as well as the plan as outlined below were extensively discussed with the patient who voiced understanding. All questions were answered to their satisfaction.  When available, results of the above investigations and possible further recommendations will be communicated to the patient via telephone/MyChart. Patient to call office if not contacted after expected testing turnaround time.   Thank you for allowing me to participate in patient's care.  If I can answer any additional questions, I would be pleased to do so.  Jacquelyne Balint, MD   CC: Plotnikov, Georgina Quint, MD [address] [place] [address]  CC: Referring provider: Plotnikov, Georgina Quint, MD [address] [place],  [place] [address]

## 2023-11-27 ENCOUNTER — Encounter: Payer: Self-pay | Admitting: Neurology

## 2023-11-27 ENCOUNTER — Other Ambulatory Visit: Payer: BC Managed Care – PPO

## 2023-11-27 ENCOUNTER — Telehealth: Payer: Self-pay

## 2023-11-27 ENCOUNTER — Ambulatory Visit: Payer: Self-pay | Admitting: Neurology

## 2023-11-27 VITALS — BP 136/80 | HR 85 | Ht 66.0 in | Wt 248.0 lb

## 2023-11-27 DIAGNOSIS — H4711 Papilledema associated with increased intracranial pressure: Secondary | ICD-10-CM

## 2023-11-27 DIAGNOSIS — G932 Benign intracranial hypertension: Secondary | ICD-10-CM

## 2023-11-27 DIAGNOSIS — H547 Unspecified visual loss: Secondary | ICD-10-CM

## 2023-11-27 DIAGNOSIS — R5382 Chronic fatigue, unspecified: Secondary | ICD-10-CM | POA: Diagnosis not present

## 2023-11-27 NOTE — Patient Instructions (Addendum)
I saw you today for IIH (or pseudotumor cerebri) and vision loss.  I would like to get lab work today.  I am also ordering an urgent lumbar puncture. There may have an opening tomorrow in [place] for a lumbar puncture. The facility has closed for the day, but we will call in the morning when they open (11 am). Please keep your phone close as someone will be in touch with you.  [facility] [place] is located at [address], [place], [place] [address]. [phone] is the number for [facility].  Continue diamox 500 mg twice daily  I will see you back to clinic in 1 month. If you have vision loss, please go to the nearest ED as this is an emergency.  The physicians and staff at [facility] are committed to providing excellent care. You may receive a survey requesting feedback about your experience at our office. We strive to receive "very good" responses to the survey questions. If you feel that your experience would prevent you from giving the office a "very good " response, please contact our office to try to remedy the situation. We may be reached at [phone]. Thank you for taking the time out of your busy day to complete the survey.  Jacquelyne Balint, MD [facility]

## 2023-11-27 NOTE — Telephone Encounter (Signed)
Pt called an informed that LP is at 11am an arrive at 10:30a. Pt stated that she will be there LP site is in [place] ,

## 2023-11-28 ENCOUNTER — Ambulatory Visit
Admission: RE | Admit: 2023-11-28 | Discharge: 2023-11-28 | Disposition: A | Discharge status: Home / Self Care | Payer: BC Managed Care – PPO | Source: Ambulatory Visit | Attending: Neurology | Admitting: Neurology

## 2023-11-28 ENCOUNTER — Telehealth: Payer: Self-pay | Admitting: Neurology

## 2023-11-28 VITALS — BP 145/93 | HR 73 | Temp 97.8°F | Resp 16 | Ht 66.0 in | Wt 248.0 lb

## 2023-11-28 DIAGNOSIS — H4710 Unspecified papilledema: Secondary | ICD-10-CM | POA: Diagnosis not present

## 2023-11-28 DIAGNOSIS — H547 Unspecified visual loss: Secondary | ICD-10-CM | POA: Insufficient documentation

## 2023-11-28 DIAGNOSIS — H4711 Papilledema associated with increased intracranial pressure: Secondary | ICD-10-CM | POA: Insufficient documentation

## 2023-11-28 DIAGNOSIS — G932 Benign intracranial hypertension: Secondary | ICD-10-CM | POA: Insufficient documentation

## 2023-11-28 LAB — PREGNANCY, URINE: Preg Test, Ur: NEGATIVE

## 2023-11-28 MED ORDER — ACETAMINOPHEN 325 MG PO TABS
ORAL_TABLET | ORAL | Status: AC
  Filled 2023-11-28: qty 2

## 2023-11-28 MED ORDER — LIDOCAINE HCL (PF) 1 % IJ SOLN
5.5000 mL | Freq: Once | INTRAMUSCULAR | Status: DC
  Filled 2023-11-28: qty 6

## 2023-11-28 MED ORDER — ACETAMINOPHEN 325 MG PO TABS
650.0000 mg | ORAL_TABLET | Freq: Once | ORAL | Status: AC
  Administered 2023-11-28: 650 mg via ORAL
  Filled 2023-11-28: qty 2

## 2023-11-28 NOTE — ED Provider Notes (Signed)
Lumbar Puncture  Date/Time: 11/28/2023 8:36 AM  Performed by: Gwyneth Sprout, MD Authorized by: Gwyneth Sprout, MD   Consent:    Consent obtained:  Written   Consent given by:  Patient   Risks, benefits, and alternatives were discussed: yes     Risks discussed:  Repeat procedure, pain, headache and bleeding   Alternatives discussed:  Delayed treatment Universal protocol:    Procedure explained and questions answered to patient or proxy's satisfaction: yes     Relevant documents present and verified: yes     Imaging studies available: yes     Patient identity confirmed:  Verbally with patient Pre-procedure details:    Procedure purpose:  Therapeutic   Preparation: Patient was prepped and draped in usual sterile fashion   Anesthesia:    Anesthesia method:  Local infiltration   Local anesthetic:  Lidocaine 1% WITH epi Procedure details:    Lumbar space:  L4-L5 interspace   Patient position:  Sitting   Needle gauge:  20   Needle type:  Diamond point   Ultrasound guidance: no     Number of attempts:  3 Post-procedure details:    Puncture site:  Adhesive bandage applied   Procedure completion:  Procedure terminated electively by provider Comments:     After multiple attempts and pt still having discomfort with procedure it was terminated     Gwyneth Sprout, MD 11/28/23 [phone]

## 2023-11-28 NOTE — Procedures (Signed)
Therapeutic lumbar puncture performed. Please see full dictation under the imaging tab in Epic.  [place], AGACNP-BC 11/28/2023, 1:20 PM

## 2023-11-28 NOTE — Telephone Encounter (Signed)
Called patient about results of LP. Opening pressure was 12 cm of H2O. Only 2 cc were taken off as a result. Patient states she has been feeling better since taking the diamox. It may be that the little bit of diamox was enough to lower her pressure, though this seems to be very quick onset.  She will follow up with me as planned and call with any worsening of symptoms.  All questions were answered.  Jacquelyne Balint, MD [facility]

## 2023-12-02 LAB — VITAMIN B1: Vitamin B1 (Thiamine): 9 nmol/L (ref 8–30)

## 2023-12-02 LAB — TSH: TSH: 1.18 m[IU]/L

## 2023-12-02 LAB — VITAMIN B12: Vitamin B-12: 590 pg/mL (ref 200–1100)

## 2023-12-03 ENCOUNTER — Encounter: Payer: Self-pay | Admitting: Neurology

## 2023-12-05 ENCOUNTER — Telehealth: Payer: Self-pay | Admitting: Neurology

## 2023-12-05 DIAGNOSIS — H4711 Papilledema associated with increased intracranial pressure: Secondary | ICD-10-CM | POA: Diagnosis not present

## 2023-12-05 NOTE — Telephone Encounter (Signed)
 Referring office [facility]. Phone-[phone] Fax-[phone] would like Notes from visit faxed from 11/27/23.

## 2023-12-08 NOTE — Telephone Encounter (Signed)
 Called pt and received verbal consent to fax office visit to [facility]. Phone-[phone] Fax-[phone] . 12/08/23. Was faxed.

## 2023-12-12 ENCOUNTER — Ambulatory Visit: Payer: Self-pay | Admitting: Neurology

## 2023-12-24 NOTE — Progress Notes (Deleted)
 NEUROLOGY FOLLOW UP OFFICE NOTE  Angel Clayton [id number]  Subjective:  Angel Clayton is a 43 y.o. year old right handed female with a medical history of IIH, obesity s/p gastric sleeve surgery who we last saw on 11/27/23 for IIH.  To briefly review: Patient has had worsening blurry vision since ~07/2023. She thought she just needed glasses. It has slowly gotten worse. She lost vision in her right eye in 10/2022. She has known IIH and been seen at [facility] and [facility] in the past. She was previously on Diamox in the past. She stopped this in 2020 though as she did not have symptoms and had lost weight. She endorses gaining back weight more recently.   Patient saw Dr. Hanley Ben at [facility] on 11/18/23. She was found to have severe papilledema on exam. She returned on 11/25/23 and had worse vision. She was then sent to the ED on 11/25/23 for MRI and LP. LP was attempted in the ED but unsuccessful. 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 Optic nerves appeared normal.   Patient was restarted on Diamox last night. She is taking 500 mg BID. She has taken 2 doses so far. She is not sure there is a big difference yet, but maybe a little. She has a metallic taste when she drinks soda, but no other side effects.   Currently, she thinks the left eye is working well but the right eye is blurry. She has a headache about every 2 days. It is not as bad as she first was diagnosed. It is about 6/10. It is more on the left side of her head than right. She will take tylenol and symptoms will resolve (she takes a couple of times per week).   She is also taking zinc and vit D.   Patient was seen by Dr. Marjory Lies at [facility] on 09/23/2018 for IIH and migraines and was on Diamox at that time 500 mg BID. Per note, LP on 09/17/18 showed an opening pressure of 25 cm of H2O.   Also seen at [facility] op[facility] in 2020 and was on Diamox 250 mg BID at that time - mentions having tingling of tongue    EtOH use: occasional wine Family history: Mom and 2 cousins all have IIH  Most recent Assessment and Plan (11/27/23): Angel Clayton is a 43 y.o. female who presents for evaluation of headaches, blurry vision, and right eye vision loss episodes. She has a relevant medical history of IIH and obesity s/p gastric sleeve surgery. Her neurological examination is pertinent for red desaturation and mildly decreased visual acuity in the right eye and bilateral papilledema. Available diagnostic data is significant for MRI brain with expanded sella with arachnoid herniation. LP in 2019 showed an opening pressure of 25 cm of H20. Patient's symptoms are consistent with IIH, now with visual symptoms. She had previously been on diamox but stopped in 2020 as her symptoms had improved. She was put back on Diamox 500 mg BID, but would benefit from urgent LP to help relieve pressure and preserve as much vision as possible. Unfortunately, patient went to the ED and LP was unsuccessful. I will attempt to get patient into LP under fluoro ASAP.   PLAN: -Blood work: B1, B12, TSH -Urgent LP with opening pressure and high volume tap -Continue Diamox 500 mg BID -Continue vit D supplementation  Since their last visit: LP on 11/28/23 showed a normal opening pressure of 12 cm H2O. Patient mentioned on  the phone that day that she had been feeling better since restarting the diamox. Her lab work was also normal.  Headaches? Vision?  MEDICATIONS:  Outpatient Encounter Medications as of 01/01/2024  Medication Sig   acetaZOLAMIDE (DIAMOX) 250 MG tablet Take 2 tablets (500 mg total) by mouth 2 (two) times daily for 20 days.   VITAMIN D PO Take by mouth.   Zinc 50 MG TABS Take by mouth.   No facility-administered encounter medications on file as of 01/01/2024.    PAST MEDICAL HISTORY: 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 [facility]   Hx of  breast reduction, elective    Hypertension    OBESITY 05/16/2007   Papilledema of both eyes 09/2018   Pilonidal cyst     PAST SURGICAL HISTORY: Past Surgical History:  Procedure Laterality Date   BREAST REDUCTION SURGERY  2005   LAPAROSCOPIC GASTRIC SLEEVE RESECTION  2016   LAPAROSCOPY N/A 09/21/2019   Procedure: LAPAROSCOPY DIAGNOSTIC, removal of ectopic, PARTIAL LEFT OOPHERECTOMY;  Surgeon: Edwinna Areola, DO;  Location: [facility] OR;  Service: Gynecology;  Laterality: N/A;   UPPER GI ENDOSCOPY  09/06/2015   WISDOM TOOTH EXTRACTION  09/2019    ALLERGIES: No Known Allergies  FAMILY HISTORY: Family History  Problem Relation Age of Onset   Hypertension Mother    Pseudotumor cerebri Mother    Hypertension Father     SOCIAL HISTORY: Social History   Tobacco Use   Smoking status: Never   Smokeless tobacco: Never  Vaping Use   Vaping status: Never Used  Substance Use Topics   Alcohol use: Yes    Comment: Occasional Wine   Drug use: Never   Social History   Social History Narrative   Lives with husband   Caffeine- 2 teas daily      Are you right handed or left handed? Right Handed    Are you currently employed ? 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 No   Who lives with you?  Husband and son Almeta Monas       What type of home do you live in: 1 story or 2 story? One story home          Objective:  Vital Signs:  There were no vitals taken for this visit.  ***  Labs and Imaging review: New results: 11/27/23: B1 wnl B12: 590 TSH: 1.18  Lumbar puncture (11/28/23): opening pressure 12 cm H2O  Previously reviewed results: 11/25/23: hCG negative CMP unremarkable CBC w/ diff significant for Hb 10.1 (chronic), MCV 92.8 HbA1c: 5.7   05/31/21: Ferritin: 4 B12: 559 TSH wnl Vit D 12.36   Imaging/Procedures: MRI brain and orbits w/wo contrast (11/25/23): Per my read: Expanded sella   Per  radiology: FINDINGS: MRI HEAD FINDINGS   Brain: Diffusion imaging does not show any acute or subacute infarction or other cause of restricted diffusion. There is an enlarged sella with arachnoid herniation, which can be seen in the setting of intracranial hypertension. This is progressive since 2019, further suggesting this diagnosis. The brain parenchyma itself is normal without evidence of malformation, atrophy, old or acute infarction, mass lesion, hemorrhage, hydrocephalus or extra-axial collection. After contrast administration, no abnormal enhancement occurs.   Vascular: Major vessels at the base of the brain show flow.   Skull and upper cervical spine:  Negative otherwise   Other: None   MRI ORBITS FINDINGS   Orbits: Both globes appear normal. Optic nerves are normal by MRI. Optic nerve sheaths appear normal. No evidence of abnormal T2 signal or contrast enhancement. Orbital fat is normal. Extra-ocular muscles are normal. Lacrimal glands are normal.   Visualized sinuses: Normal and clear.   Soft tissues: Other regional soft tissues appear normal.   IMPRESSION: 1. Enlarged sella with arachnoid herniation, which can be seen in the setting of intracranial hypertension. This is progressive since 2019, further suggesting this diagnosis. 2. Otherwise normal appearance of the brain itself. 3. Normal appearance of the orbits. No evidence of optic neuritis by MRI.   LP (09/17/2018): Opening pressure 25 cm of H2O  Assessment/Plan:  This is Angel Clayton, a 43 y.o. female with: ***   Plan: ***  Return to clinic in ***  Total time spent reviewing records, interview, history/exam, documentation, and coordination of care on day of encounter:  *** min  Jacquelyne Balint, MD

## 2023-12-25 DIAGNOSIS — H4711 Papilledema associated with increased intracranial pressure: Secondary | ICD-10-CM | POA: Diagnosis not present

## 2023-12-29 ENCOUNTER — Ambulatory Visit: Payer: BC Managed Care – PPO | Admitting: Neurology

## 2024-01-01 ENCOUNTER — Ambulatory Visit: Admitting: Neurology

## 2024-01-08 NOTE — Progress Notes (Unsigned)
 NEUROLOGY FOLLOW UP OFFICE NOTE  Angel Clayton [id number]  Subjective:  Angel Clayton is a 43 y.o. year old right handed female with a medical history of IIH, obesity s/p gastric sleeve surgery who we last saw on 11/27/23 for IIH.  To briefly review: Patient has had worsening blurry vision since ~07/2023. She thought she just needed glasses. It has slowly gotten worse. She lost vision in her right eye in 10/2022. She has known IIH and been seen at [facility] and [facility] in the past. She was previously on Diamox in the past. She stopped this in 2020 though as she did not have symptoms and had lost weight. She endorses gaining back weight more recently.   Patient saw Dr. Hanley Ben at [facility] on 11/18/23. She was found to have severe papilledema on exam. She returned on 11/25/23 and had worse vision. She was then sent to the ED on 11/25/23 for MRI and LP. LP was attempted in the ED but unsuccessful. 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 Optic nerves appeared normal.   Patient was restarted on Diamox last night. She is taking 500 mg BID. She has taken 2 doses so far. She is not sure there is a big difference yet, but maybe a little. She has a metallic taste when she drinks soda, but no other side effects.   Currently, she thinks the left eye is working well but the right eye is blurry. She has a headache about every 2 days. It is not as bad as she first was diagnosed. It is about 6/10. It is more on the left side of her head than right. She will take tylenol and symptoms will resolve (she takes a couple of times per week).   She is also taking zinc and vit D.   Patient was seen by Dr. Marjory Lies at [facility] on 09/23/2018 for IIH and migraines and was on Diamox at that time 500 mg BID. Per note, LP on 09/17/18 showed an opening pressure of 25 cm of H2O.   Also seen at [facility] in 2020 and was on Diamox 250 mg BID at that time - mentions having tingling of tongue    EtOH use: occasional wine Family history: Mom and 2 cousins all have IIH  Most recent Assessment and Plan (11/27/23): Angel Clayton is a 43 y.o. female who presents for evaluation of headaches, blurry vision, and right eye vision loss episodes. She has a relevant medical history of IIH and obesity s/p gastric sleeve surgery. Her neurological examination is pertinent for red desaturation and mildly decreased visual acuity in the right eye and bilateral papilledema. Available diagnostic data is significant for MRI brain with expanded sella with arachnoid herniation. LP in 2019 showed an opening pressure of 25 cm of H20. Patient's symptoms are consistent with IIH, now with visual symptoms. She had previously been on diamox but stopped in 2020 as her symptoms had improved. She was put back on Diamox 500 mg BID, but would benefit from urgent LP to help relieve pressure and preserve as much vision as possible. Unfortunately, patient went to the ED and LP was unsuccessful. I will attempt to get patient into LP under fluoro ASAP.   PLAN: -Blood work: B1, B12, TSH -Urgent LP with opening pressure and high volume tap -Continue Diamox 500 mg BID -Continue vit D supplementation  Since their last visit: LP on 11/28/23 showed a normal opening pressure of 12 cm H2O. Patient mentioned on  the phone that day that she had been feeling better since restarting the diamox. Her lab work was also normal.  Headaches? Vision?  MEDICATIONS:  Outpatient Encounter Medications as of 01/09/2024  Medication Sig   acetaZOLAMIDE (DIAMOX) 250 MG tablet Take 2 tablets (500 mg total) by mouth 2 (two) times daily for 20 days.   VITAMIN D PO Take by mouth.   Zinc 50 MG TABS Take by mouth.   No facility-administered encounter medications on file as of 01/09/2024.    PAST MEDICAL HISTORY: 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 [facility]   Hx of  breast reduction, elective    Hypertension    OBESITY 05/16/2007   Papilledema of both eyes 09/2018   Pilonidal cyst     PAST SURGICAL HISTORY: Past Surgical History:  Procedure Laterality Date   BREAST REDUCTION SURGERY  2005   LAPAROSCOPIC GASTRIC SLEEVE RESECTION  2016   LAPAROSCOPY N/A 09/21/2019   Procedure: LAPAROSCOPY DIAGNOSTIC, removal of ectopic, PARTIAL LEFT OOPHERECTOMY;  Surgeon: Edwinna Areola, DO;  Location: [facility] OR;  Service: Gynecology;  Laterality: N/A;   UPPER GI ENDOSCOPY  09/06/2015   WISDOM TOOTH EXTRACTION  09/2019    ALLERGIES: No Known Allergies  FAMILY HISTORY: Family History  Problem Relation Age of Onset   Hypertension Mother    Pseudotumor cerebri Mother    Hypertension Father     SOCIAL HISTORY: Social History   Tobacco Use   Smoking status: Never   Smokeless tobacco: Never  Vaping Use   Vaping status: Never Used  Substance Use Topics   Alcohol use: Yes    Comment: Occasional Wine   Drug use: Never   Social History   Social History Narrative   Lives with husband   Caffeine- 2 teas daily      Are you right handed or left handed? Right Handed    Are you currently employed ? 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 No   Who lives with you?  Husband and son Almeta Monas       What type of home do you live in: 1 story or 2 story? One story home          Objective:  Vital Signs:  There were no vitals taken for this visit.  ***  Labs and Imaging review: New results: 11/27/23: B1 wnl B12: 590 TSH: 1.18  Lumbar puncture (11/28/23): opening pressure 12 cm H2O  Previously reviewed results: 11/25/23: hCG negative CMP unremarkable CBC w/ diff significant for Hb 10.1 (chronic), MCV 92.8 HbA1c: 5.7   05/31/21: Ferritin: 4 B12: 559 TSH wnl Vit D 12.36   Imaging/Procedures: MRI brain and orbits w/wo contrast (11/25/23): Per my read: Expanded sella   Per  radiology: FINDINGS: MRI HEAD FINDINGS   Brain: Diffusion imaging does not show any acute or subacute infarction or other cause of restricted diffusion. There is an enlarged sella with arachnoid herniation, which can be seen in the setting of intracranial hypertension. This is progressive since 2019, further suggesting this diagnosis. The brain parenchyma itself is normal without evidence of malformation, atrophy, old or acute infarction, mass lesion, hemorrhage, hydrocephalus or extra-axial collection. After contrast administration, no abnormal enhancement occurs.   Vascular: Major vessels at the base of the brain show flow.   Skull and upper cervical spine:  Negative otherwise   Other: None   MRI ORBITS FINDINGS   Orbits: Both globes appear normal. Optic nerves are normal by MRI. Optic nerve sheaths appear normal. No evidence of abnormal T2 signal or contrast enhancement. Orbital fat is normal. Extra-ocular muscles are normal. Lacrimal glands are normal.   Visualized sinuses: Normal and clear.   Soft tissues: Other regional soft tissues appear normal.   IMPRESSION: 1. Enlarged sella with arachnoid herniation, which can be seen in the setting of intracranial hypertension. This is progressive since 2019, further suggesting this diagnosis. 2. Otherwise normal appearance of the brain itself. 3. Normal appearance of the orbits. No evidence of optic neuritis by MRI.   LP (09/17/2018): Opening pressure 25 cm of H2O  Assessment/Plan:  This is Angel Clayton, a 43 y.o. female with: ***   Plan: ***  Return to clinic in ***  Total time spent reviewing records, interview, history/exam, documentation, and coordination of care on day of encounter:  *** min  Jacquelyne Balint, MD

## 2024-01-09 ENCOUNTER — Ambulatory Visit (INDEPENDENT_AMBULATORY_CARE_PROVIDER_SITE_OTHER): Admitting: Neurology

## 2024-01-09 ENCOUNTER — Encounter: Payer: Self-pay | Admitting: Neurology

## 2024-01-09 VITALS — BP 144/97 | HR 86 | Ht 66.0 in | Wt 245.0 lb

## 2024-01-09 DIAGNOSIS — H547 Unspecified visual loss: Secondary | ICD-10-CM | POA: Diagnosis not present

## 2024-01-09 DIAGNOSIS — G932 Benign intracranial hypertension: Secondary | ICD-10-CM

## 2024-01-09 DIAGNOSIS — H4711 Papilledema associated with increased intracranial pressure: Secondary | ICD-10-CM | POA: Diagnosis not present

## 2024-01-09 MED ORDER — ACETAZOLAMIDE 250 MG PO TABS: 500.0000 mg | ORAL_TABLET | Freq: Two times a day (BID) | ORAL | 11 refills | Status: DC

## 2024-01-09 NOTE — Patient Instructions (Signed)
 Increase your diamox back to 500 mg twice daily (2 pills twice a day).  Please call me if your vision and headaches do not improve or if they worsen.  If you have severe or complete vision loss, this would be reason to go to the nearest ED for emergent treatment (lumbar puncture).  I will see you back in clinic in 3 months. Please let me know if you have any questions or concerns in the meantime.  The physicians and staff at [facility] are committed to providing excellent care. You may receive a survey requesting feedback about your experience at our office. We strive to receive "very good" responses to the survey questions. If you feel that your experience would prevent you from giving the office a "very good " response, please contact our office to try to remedy the situation. We may be reached at [phone]. Thank you for taking the time out of your busy day to complete the survey.  Jacquelyne Balint, MD [facility]

## 2024-01-27 DIAGNOSIS — H4711 Papilledema associated with increased intracranial pressure: Secondary | ICD-10-CM | POA: Diagnosis not present

## 2024-02-02 ENCOUNTER — Other Ambulatory Visit: Payer: Self-pay | Admitting: Obstetrics and Gynecology

## 2024-02-02 DIAGNOSIS — Z1231 Encounter for screening mammogram for malignant neoplasm of breast: Secondary | ICD-10-CM

## 2024-02-03 ENCOUNTER — Ambulatory Visit
Admission: RE | Admit: 2024-02-03 | Discharge: 2024-02-03 | Disposition: A | Discharge status: Home / Self Care | Source: Ambulatory Visit

## 2024-02-03 DIAGNOSIS — Z1231 Encounter for screening mammogram for malignant neoplasm of breast: Secondary | ICD-10-CM

## 2024-02-13 ENCOUNTER — Ambulatory Visit (INDEPENDENT_AMBULATORY_CARE_PROVIDER_SITE_OTHER): Admitting: Internal Medicine

## 2024-02-13 ENCOUNTER — Encounter: Payer: Self-pay | Admitting: Internal Medicine

## 2024-02-13 VITALS — BP 118/80 | HR 83 | Temp 98.1°F | Ht 66.0 in | Wt 247.0 lb

## 2024-02-13 DIAGNOSIS — F900 Attention-deficit hyperactivity disorder, predominantly inattentive type: Secondary | ICD-10-CM

## 2024-02-13 DIAGNOSIS — D509 Iron deficiency anemia, unspecified: Secondary | ICD-10-CM

## 2024-02-13 DIAGNOSIS — F988 Other specified behavioral and emotional disorders with onset usually occurring in childhood and adolescence: Secondary | ICD-10-CM | POA: Insufficient documentation

## 2024-02-13 MED ORDER — B COMPLEX VITAMINS PO CAPS: 1.0000 | ORAL_CAPSULE | Freq: Every day | ORAL | Status: AC

## 2024-02-13 MED ORDER — AMPHETAMINE-DEXTROAMPHETAMINE 10 MG PO TABS: ORAL_TABLET | ORAL | 0 refills | Status: DC

## 2024-02-13 NOTE — Assessment & Plan Note (Signed)
 New Options discussed Start Adderall every day or bid 5-10 mg

## 2024-02-13 NOTE — Assessment & Plan Note (Signed)
 Take iron 325 mg bid if tol

## 2024-02-13 NOTE — Progress Notes (Unsigned)
 Subjective:  Patient ID: Angel Clayton, female    DOB: 1981-05-06  Age: 43 y.o. MRN: [id number]  CC: Medical Management of Chronic Issues (Discuss meds to help with focusing.)   HPI Angel Clayton presents for probable ADD, poor focus Pt works at [employer] She has a 75 yo son   Outpatient Medications Prior to Visit  Medication Sig Dispense Refill  . acetaZOLAMIDE  (DIAMOX ) 250 MG tablet Take 2 tablets (500 mg total) by mouth 2 (two) times daily. 60 tablet 11  . VITAMIN D  PO Take by mouth.    . Zinc 50 MG TABS Take by mouth.     No facility-administered medications prior to visit.    ROS: Review of Systems  Constitutional:  Negative for activity change, appetite change, chills, fatigue and unexpected weight change.  HENT:  Negative for congestion, mouth sores and sinus pressure.   Eyes:  Negative for visual disturbance.  Respiratory:  Negative for cough and chest tightness.   Gastrointestinal:  Negative for abdominal pain and nausea.  Genitourinary:  Negative for difficulty urinating, frequency and vaginal pain.  Musculoskeletal:  Negative for back pain and gait problem.  Skin:  Negative for pallor and rash.  Neurological:  Negative for dizziness, tremors, weakness, numbness and headaches.  Hematological:  Does not bruise/bleed easily.  Psychiatric/Behavioral:  Positive for decreased concentration and sleep disturbance. Negative for agitation, behavioral problems and confusion. The patient is nervous/anxious.     Objective:  BP 118/80   Pulse 83   Temp 98.1 F (36.7 C) (Oral)   Ht 5\' 6"  (1.676 m)   Wt 247 lb (112 kg)   LMP 01/27/2024   SpO2 99%   BMI 39.87 kg/m   BP Readings from Last 3 Encounters:  02/13/24 118/80  01/09/24 (!) 144/97  11/28/23 (!) 145/93    Wt Readings from Last 3 Encounters:  02/13/24 247 lb (112 kg)  01/09/24 245 lb (111.1 kg)  11/28/23 248 lb (112.5 kg)    Physical Exam Constitutional:      General: She is not in acute distress.     Appearance: She is well-developed. She is obese.  HENT:     Head: Normocephalic.     Right Ear: External ear normal.     Left Ear: External ear normal.     Nose: Nose normal.  Eyes:     General:        Right eye: No discharge.        Left eye: No discharge.     Conjunctiva/sclera: Conjunctivae normal.     Pupils: Pupils are equal, round, and reactive to light.  Neck:     Thyroid : No thyromegaly.     Vascular: No JVD.     Trachea: No tracheal deviation.  Cardiovascular:     Rate and Rhythm: Normal rate and regular rhythm.     Heart sounds: Normal heart sounds.  Pulmonary:     Effort: No respiratory distress.     Breath sounds: No stridor. No wheezing.  Abdominal:     General: Bowel sounds are normal. There is no distension.     Palpations: Abdomen is soft. There is no mass.     Tenderness: There is no abdominal tenderness. There is no guarding or rebound.  Musculoskeletal:        General: No tenderness.     Cervical back: Normal range of motion and neck supple. No rigidity.  Lymphadenopathy:     Cervical: No cervical adenopathy.  Skin:  Findings: No erythema or rash.  Neurological:     Cranial Nerves: No cranial nerve deficit.     Motor: No abnormal muscle tone.     Coordination: Coordination normal.     Deep Tendon Reflexes: Reflexes normal.  Psychiatric:        Behavior: Behavior normal.        Thought Content: Thought content normal.        Judgment: Judgment normal.    Lab Results  Component Value Date   WBC 8.8 11/25/2023   HGB 10.1 (L) 11/25/2023   HCT 32.1 (L) 11/25/2023   PLT 341 11/25/2023   GLUCOSE 91 11/25/2023   ALT 14 11/25/2023   AST 16 11/25/2023   NA 135 11/25/2023   K 4.0 11/25/2023   CL 104 11/25/2023   CREATININE 0.83 11/25/2023   BUN 13 11/25/2023   CO2 25 11/25/2023   TSH 1.18 11/27/2023   HGBA1C 5.7 11/19/2022    MM 3D SCREENING MAMMOGRAM BILATERAL BREAST Result Date: 02/05/2024 CLINICAL DATA:  Screening. EXAM: DIGITAL SCREENING  BILATERAL MAMMOGRAM WITH TOMOSYNTHESIS AND CAD TECHNIQUE: Bilateral screening digital craniocaudal and mediolateral oblique mammograms were obtained. Bilateral screening digital breast tomosynthesis was performed. The images were evaluated with computer-aided detection. COMPARISON:  None.  Baseline study. ACR Breast Density Category b: There are scattered areas of fibroglandular density. FINDINGS: There are no findings suspicious for malignancy. IMPRESSION: No mammographic evidence of malignancy. A result letter of this screening mammogram will be mailed directly to the patient. RECOMMENDATION: Screening mammogram in one year. (Code:SM-B-01Y) BI-RADS CATEGORY  1: Negative. Electronically Signed   By: Amanda Jungling M.D.   On: 02/05/2024 09:56    Assessment & Plan:   Problem List Items Addressed This Visit     Anemia   Take iron 325 mg bid if tol      ADD (attention deficit disorder) - Primary   New Options discussed Start Adderall every day or bid 5-10 mg         Meds ordered this encounter  Medications  . b complex vitamins capsule    Sig: Take 1 capsule by mouth daily.  Angel Clayton amphetamine-dextroamphetamine (ADDERALL) 10 MG tablet    Sig: Take one in am and one at lunch    Dispense:  60 tablet    Refill:  0      Follow-up: No follow-ups on file.  Angel Barn, MD

## 2024-02-16 ENCOUNTER — Ambulatory Visit: Admitting: Internal Medicine

## 2024-03-02 ENCOUNTER — Ambulatory Visit: Admitting: Internal Medicine

## 2024-03-11 ENCOUNTER — Telehealth: Payer: Self-pay | Admitting: Neurology

## 2024-03-11 DIAGNOSIS — G932 Benign intracranial hypertension: Secondary | ICD-10-CM

## 2024-03-11 MED ORDER — ACETAZOLAMIDE 250 MG PO TABS: 750.0000 mg | ORAL_TABLET | Freq: Two times a day (BID) | ORAL | 11 refills | Status: AC

## 2024-03-11 NOTE — Telephone Encounter (Signed)
 I received a call from patient's ophthalmologist today, Dr. Nana Avers at [facility]. Patient's vision is worsening and her visual testing is worse. She would like to increase patient's diamox  and refer her to neuro-ophtho for another opinion.   I spoke with patient and she is agreeable to increase diamox . She is currently taking 500 mg BID. She will take 500/750 for 1 week, then increase to 750 mg BID thereafter. She will call with new or worsening symptoms.  All questions were answered.  Rommie Coats, MD [facility]
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
## 2024-04-16 ENCOUNTER — Ambulatory Visit (INDEPENDENT_AMBULATORY_CARE_PROVIDER_SITE_OTHER): Admitting: Neurology

## 2024-04-16 ENCOUNTER — Encounter: Payer: Self-pay | Admitting: Neurology

## 2024-04-16 ENCOUNTER — Other Ambulatory Visit

## 2024-04-16 VITALS — BP 159/128 | HR 78 | Ht 66.0 in | Wt 246.0 lb

## 2024-04-16 DIAGNOSIS — G932 Benign intracranial hypertension: Secondary | ICD-10-CM | POA: Diagnosis not present

## 2024-04-16 DIAGNOSIS — Z5181 Encounter for therapeutic drug level monitoring: Secondary | ICD-10-CM | POA: Diagnosis not present

## 2024-04-16 DIAGNOSIS — H547 Unspecified visual loss: Secondary | ICD-10-CM

## 2024-04-16 DIAGNOSIS — H4711 Papilledema associated with increased intracranial pressure: Secondary | ICD-10-CM | POA: Diagnosis not present

## 2024-04-16 NOTE — Patient Instructions (Signed)
 Continue diamox  750 mg twice daily  I will check some labs today because you are on diamox  and make sure your electrolytes are okay.  I will discuss with Dr. Delories and make sure she is aware of our visit and ask about the referral to neuro-ophthalmology.   I will see you back in 3 months. Please let me know if you have any questions or concerns in the meantime, particularly if vision is worsening or changing.  The physicians and staff at [facility] are committed to providing excellent care. You may receive a survey requesting feedback about your experience at our office. We strive to receive very good responses to the survey questions. If you feel that your experience would prevent you from giving the office a very good  response, please contact our office to try to remedy the situation. We may be reached at [phone]. Thank you for taking the time out of your busy day to complete the survey.  Venetia Potters, MD [facility]

## 2024-04-17 LAB — COMPREHENSIVE METABOLIC PANEL WITH GFR
AG Ratio: 1.3 (calc) (ref 1.0–2.5)
ALT: 11 U/L (ref 6–29)
AST: 14 U/L (ref 10–30)
Albumin: 4.1 g/dL (ref 3.6–5.1)
Alkaline phosphatase (APISO): 71 U/L (ref 31–125)
BUN: 10 mg/dL (ref 7–25)
CO2: 26 mmol/L (ref 20–32)
Calcium: 9.1 mg/dL (ref 8.6–10.2)
Chloride: 104 mmol/L (ref 98–110)
Creat: 0.93 mg/dL (ref 0.50–0.99)
Globulin: 3.2 g/dL (ref 1.9–3.7)
Glucose, Bld: 93 mg/dL (ref 65–99)
Potassium: 4.8 mmol/L (ref 3.5–5.3)
Sodium: 137 mmol/L (ref 135–146)
Total Bilirubin: 0.3 mg/dL (ref 0.2–1.2)
Total Protein: 7.3 g/dL (ref 6.1–8.1)
eGFR: 80 mL/min/1.73m2 (ref >=60)

## 2024-04-17 LAB — PHOSPHORUS: Phosphorus: 2.5 mg/dL (ref 2.5–4.5)

## 2024-04-17 LAB — MAGNESIUM: Magnesium: 2.1 mg/dL (ref 1.5–2.5)

## 2024-04-19 ENCOUNTER — Ambulatory Visit: Payer: Self-pay | Admitting: Neurology

## 2024-04-28 ENCOUNTER — Ambulatory Visit: Admitting: Neurology

## 2024-05-06 ENCOUNTER — Encounter: Payer: Self-pay | Admitting: Internal Medicine

## 2024-05-19 ENCOUNTER — Encounter: Payer: Self-pay | Admitting: Internal Medicine

## 2024-05-19 ENCOUNTER — Ambulatory Visit: Admitting: Internal Medicine

## 2024-05-19 VITALS — BP 120/80 | HR 78 | Temp 98.1°F | Ht 66.0 in | Wt 249.0 lb

## 2024-05-19 DIAGNOSIS — I10 Essential (primary) hypertension: Secondary | ICD-10-CM

## 2024-05-19 DIAGNOSIS — E559 Vitamin D deficiency, unspecified: Secondary | ICD-10-CM | POA: Diagnosis not present

## 2024-05-19 DIAGNOSIS — R030 Elevated blood-pressure reading, without diagnosis of hypertension: Secondary | ICD-10-CM

## 2024-05-19 DIAGNOSIS — F900 Attention-deficit hyperactivity disorder, predominantly inattentive type: Secondary | ICD-10-CM

## 2024-05-19 MED ORDER — AMPHETAMINE-DEXTROAMPHETAMINE 10 MG PO TABS: ORAL_TABLET | ORAL | 0 refills | Status: DC

## 2024-05-19 MED ORDER — TRIAMTERENE-HCTZ 37.5-25 MG PO TABS: 1.0000 | ORAL_TABLET | Freq: Every day | ORAL | 11 refills | Status: AC

## 2024-05-19 NOTE — Assessment & Plan Note (Signed)
Replacing Vit D

## 2024-05-19 NOTE — Progress Notes (Signed)
 Subjective:  Patient ID: Angel Clayton, female    DOB: Nov 23, 1980  Age: 43 y.o. MRN: [id number]  CC: Medical Management of Chronic Issues   HPI Ramah Langhans Zenner presents for ADD, vitamin D  deficiency, elevated blood pressure  Outpatient Medications Prior to Visit  Medication Sig Dispense Refill   acetaZOLAMIDE  (DIAMOX ) 250 MG tablet Take 3 tablets (750 mg total) by mouth 2 (two) times daily. 180 tablet 11   Zinc 50 MG TABS Take by mouth. (Patient taking differently: Take by mouth as needed.)     amphetamine -dextroamphetamine  (ADDERALL) 10 MG tablet Take one in am and one at lunch 60 tablet 0   b complex vitamins capsule Take 1 capsule by mouth daily. (Patient not taking: Reported on 05/19/2024)     VITAMIN D  PO Take by mouth. (Patient not taking: Reported on 05/19/2024)     No facility-administered medications prior to visit.    ROS: Review of Systems  Constitutional:  Negative for activity change, appetite change, chills, fatigue and unexpected weight change.  HENT:  Negative for congestion, mouth sores and sinus pressure.   Eyes:  Negative for visual disturbance.  Respiratory:  Negative for cough and chest tightness.   Gastrointestinal:  Negative for abdominal pain and nausea.  Genitourinary:  Negative for difficulty urinating, frequency and vaginal pain.  Musculoskeletal:  Negative for back pain and gait problem.  Skin:  Negative for pallor and rash.  Neurological:  Negative for dizziness, tremors, weakness, numbness and headaches.  Psychiatric/Behavioral:  Negative for confusion and sleep disturbance.     Objective:  BP 120/80   Pulse 78   Temp 98.1 F (36.7 C) (Oral)   Ht 5' 6 (1.676 m)   Wt 249 lb (112.9 kg)   SpO2 99%   BMI 40.19 kg/m   BP Readings from Last 3 Encounters:  05/19/24 120/80  04/16/24 (!) 159/128  02/13/24 118/80    Wt Readings from Last 3 Encounters:  05/19/24 249 lb (112.9 kg)  04/16/24 246 lb (111.6 kg)  02/13/24 247 lb (112 kg)     Physical Exam Constitutional:      General: She is not in acute distress.    Appearance: She is well-developed.  HENT:     Head: Normocephalic.     Right Ear: External ear normal.     Left Ear: External ear normal.     Nose: Nose normal.  Eyes:     General:        Right eye: No discharge.        Left eye: No discharge.     Conjunctiva/sclera: Conjunctivae normal.     Pupils: Pupils are equal, round, and reactive to light.  Neck:     Thyroid : No thyromegaly.     Vascular: No JVD.     Trachea: No tracheal deviation.  Cardiovascular:     Rate and Rhythm: Normal rate and regular rhythm.     Heart sounds: Normal heart sounds.  Pulmonary:     Effort: No respiratory distress.     Breath sounds: No stridor. No wheezing.  Abdominal:     General: Bowel sounds are normal. There is no distension.     Palpations: Abdomen is soft. There is no mass.     Tenderness: There is no abdominal tenderness. There is no guarding or rebound.  Musculoskeletal:        General: No tenderness.     Cervical back: Normal range of motion and neck supple. No rigidity.  Lymphadenopathy:  Cervical: No cervical adenopathy.  Skin:    Findings: No erythema or rash.  Neurological:     Mental Status: She is oriented to person, place, and time.     Cranial Nerves: No cranial nerve deficit.     Motor: No abnormal muscle tone.     Coordination: Coordination normal.     Deep Tendon Reflexes: Reflexes normal.  Psychiatric:        Behavior: Behavior normal.        Thought Content: Thought content normal.        Judgment: Judgment normal.     Lab Results  Component Value Date   WBC 8.8 11/25/2023   HGB 10.1 (L) 11/25/2023   HCT 32.1 (L) 11/25/2023   PLT 341 11/25/2023   GLUCOSE 93 04/16/2024   ALT 11 04/16/2024   AST 14 04/16/2024   NA 137 04/16/2024   K 4.8 04/16/2024   CL 104 04/16/2024   CREATININE 0.93 04/16/2024   BUN 10 04/16/2024   CO2 26 04/16/2024   TSH 1.18 11/27/2023   HGBA1C 5.7  11/19/2022    MM 3D SCREENING MAMMOGRAM BILATERAL BREAST Result Date: 02/05/2024 CLINICAL DATA:  Screening. EXAM: DIGITAL SCREENING BILATERAL MAMMOGRAM WITH TOMOSYNTHESIS AND CAD TECHNIQUE: Bilateral screening digital craniocaudal and mediolateral oblique mammograms were obtained. Bilateral screening digital breast tomosynthesis was performed. The images were evaluated with computer-aided detection. COMPARISON:  None.  Baseline study. ACR Breast Density Category b: There are scattered areas of fibroglandular density. FINDINGS: There are no findings suspicious for malignancy. IMPRESSION: No mammographic evidence of malignancy. A result letter of this screening mammogram will be mailed directly to the patient. RECOMMENDATION: Screening mammogram in one year. (Code:SM-B-01Y) BI-RADS CATEGORY  1: Negative. Electronically Signed   By: Alm Parkins M.D.   On: 02/05/2024 09:56    Assessment & Plan:   Problem List Items Addressed This Visit     ADD (attention deficit disorder)     Cont on Adderall 10 mg bid  Potential benefits of a long term amphetamines  use as well as potential risks  and complications were explained to the patient and were aknowledged.       Elevated BP without diagnosis of hypertension   Start Maxzide Monitor blood pressure at home      HTN (hypertension)   Start Maxzide Monitor blood pressure at home      Relevant Medications   triamterene -hydrochlorothiazide (MAXZIDE-25) 37.5-25 MG tablet   Vitamin D  deficiency - Primary   Replacing Vit D         Meds ordered this encounter  Medications   amphetamine -dextroamphetamine  (ADDERALL) 10 MG tablet    Sig: Take one in am and one at lunch    Dispense:  60 tablet    Refill:  0   triamterene -hydrochlorothiazide (MAXZIDE-25) 37.5-25 MG tablet    Sig: Take 1 tablet by mouth daily.    Dispense:  30 tablet    Refill:  11      Follow-up: Return in about 3 months (around 08/19/2024) for a follow-up visit.  Marolyn Noel, MD

## 2024-05-19 NOTE — Assessment & Plan Note (Addendum)
   Cont on Adderall 10 mg bid  Potential benefits of a long term amphetamines  use as well as potential risks  and complications were explained to the patient and were aknowledged.

## 2024-05-19 NOTE — Assessment & Plan Note (Addendum)
 Start Maxzide Monitor blood pressure at home

## 2024-05-31 NOTE — Assessment & Plan Note (Signed)
 Start Maxzide Monitor blood pressure at home
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
## 2024-08-19 ENCOUNTER — Ambulatory Visit: Payer: Self-pay | Admitting: Internal Medicine

## 2024-08-19 ENCOUNTER — Encounter: Payer: Self-pay | Admitting: Neurology

## 2024-08-19 ENCOUNTER — Ambulatory Visit: Payer: Self-pay | Admitting: Neurology

## 2024-11-02 ENCOUNTER — Telehealth: Payer: Self-pay

## 2024-11-02 NOTE — Telephone Encounter (Signed)
 Copied from CRM [id number]. Topic: Clinical - Medication Refill >> Nov 02, 2024  3:12 PM Viola F wrote: Medication: amphetamine -dextroamphetamine  (ADDERALL) 10 MG tablet [[id number]]  Has the patient contacted their pharmacy? Yes (Agent: If no, request that the patient contact the pharmacy for the refill. If patient does not wish to contact the pharmacy document the reason why and proceed with request.) (Agent: If yes, when and what did the pharmacy advise?)  This is the patient's preferred pharmacy:   [id number] [place], [place] - [address] [place] [address] Phone: [phone] Fax: [phone]  Is this the correct pharmacy for this prescription? Yes If no, delete pharmacy and type the correct one.   Has the prescription been filled recently? Yes  Is the patient out of the medication? Yes, for 2 days   Has the patient been seen for an appointment in the last year OR does the patient have an upcoming appointment? Yes  Can we respond through MyChart? Yes  Agent: Please be advised that Rx refills may take up to 3 business days. We ask that you follow-up with your pharmacy.

## 2024-11-03 ENCOUNTER — Encounter: Payer: Self-pay | Admitting: Internal Medicine

## 2024-11-03 ENCOUNTER — Ambulatory Visit (INDEPENDENT_AMBULATORY_CARE_PROVIDER_SITE_OTHER): Payer: Self-pay | Admitting: Internal Medicine

## 2024-11-03 VITALS — BP 130/84 | HR 98 | Temp 98.2°F | Ht 66.0 in | Wt 234.0 lb

## 2024-11-03 DIAGNOSIS — G932 Benign intracranial hypertension: Secondary | ICD-10-CM

## 2024-11-03 DIAGNOSIS — Z6841 Body Mass Index (BMI) 40.0 and over, adult: Secondary | ICD-10-CM

## 2024-11-03 DIAGNOSIS — H4711 Papilledema associated with increased intracranial pressure: Secondary | ICD-10-CM

## 2024-11-03 DIAGNOSIS — E559 Vitamin D deficiency, unspecified: Secondary | ICD-10-CM

## 2024-11-03 DIAGNOSIS — E66813 Obesity, class 3: Secondary | ICD-10-CM

## 2024-11-03 DIAGNOSIS — F900 Attention-deficit hyperactivity disorder, predominantly inattentive type: Secondary | ICD-10-CM

## 2024-11-03 MED ORDER — AMPHETAMINE-DEXTROAMPHET ER 20 MG PO CP24: 20.0000 mg | ORAL_CAPSULE | ORAL | 0 refills | Status: AC

## 2024-11-03 NOTE — Assessment & Plan Note (Addendum)
 Adderall effect does not last long even when taking 20 mg in am... Change to Adderall XR 20 mg q am  Potential benefits of a long term amphetamines  use as well as potential risks  and complications were explained to the patient and were aknowledged.

## 2024-11-03 NOTE — Assessment & Plan Note (Signed)
Restart Diamox

## 2024-11-03 NOTE — Assessment & Plan Note (Signed)
 On Vit D

## 2024-11-03 NOTE — Patient Instructions (Signed)
 [employer] has recently reduced the self-pay cash prices for Zepbound  single-dose vials purchased through the [employer] online pharmacy   The new prices for a four-week supply (four single-dose vials) for self-paying patients are:   2.5 mg dose: $299 per month (previously $349) 5 mg dose: $399 per month (previously $499) 7.5 mg, 10 mg, 12.5 mg, and 15 mg doses: $449 per month (previously $499) -----------------------------------------------------------------------------------------------------------------------------------------  [employer] pills Cash Pricing & Options (2026):  $149 per month: For the 1.5 mg and 4 mg doses (available through January 19, 2025). $199 per month: For the 4 mg dose starting January 20, 2025. $299 per month: For 9 mg and 25 mg doses. Provider Programs: Companies like [employer] and [employer] offer these cash prices directly to consumers, sometimes with additional telehealth fees. Savings Restrictions: These self-pay offers are generally not available to patients with government insurance like Medicare or Medicaid.  The [employer] pill (taken daily) has a lower out-of-pocket, self-pay price compared to the [facility] injection (taken weekly), which costs $349+ per month for self-pay consumers.    Heres the typical dosing schedule for the [facility] pill:   Starting dosage: 1.5 mg once daily for the first four weeks. This starter dose helps your body get used to the medication and sets the stage for higher doses later on.  Step-up dosages: Every four weeks, the dose increased -- first to 4 mg, then 9 mg, and finally 25 mg. In clinical trials, most people reached the full dose after about three months of gradual increases.  Maintenance dosage: 25 mg once daily. This was the main dose used for ongoing weight loss in trials and is the maintenance dose approved by the [place]  Food and Drug Administration (FDA). If someone couldnt tolerate 25 mg in the trials, they stayed on a lower dose and tried  increasing again later with medical guidance.  Maximum dosage: 25 mg once daily. This is the highest dose approved by the FDA. After about 64 weeks, people taking 25 mg daily lost an average of 13.6% of their body weight.  Some studies explored even hi

## 2024-11-03 NOTE — Progress Notes (Signed)
 "  Subjective:  Patient ID: Angel Clayton, female    DOB: 1981/08/28  Age: 44 y.o. MRN: [id number]  CC: No chief complaint on file.   HPI [facility] Buendia Metivier presents for ADD, obesity. Adderall effect does not last long even when taking 20 mg in am... On Diamox  - not taking On compound Zepbound  via [facility] x 1 month  Outpatient Medications Prior to Visit  Medication Sig Dispense Refill   acetaZOLAMIDE  (DIAMOX ) 250 MG tablet Take 3 tablets (750 mg total) by mouth 2 (two) times daily. 180 tablet 11   b complex vitamins capsule Take 1 capsule by mouth daily.     triamterene -hydrochlorothiazide (MAXZIDE-25) 37.5-25 MG tablet Take 1 tablet by mouth daily. 30 tablet 11   VITAMIN D  PO Take by mouth.     Zinc 50 MG TABS Take by mouth. (Patient taking differently: Take by mouth as needed.)     amphetamine -dextroamphetamine  (ADDERALL) 10 MG tablet Take one in am and one at lunch 60 tablet 0   No facility-administered medications prior to visit.    ROS: Review of Systems  Constitutional:  Negative for activity change, appetite change, chills, fatigue and unexpected weight change.  HENT:  Negative for congestion, mouth sores and sinus pressure.   Eyes:  Negative for visual disturbance.  Respiratory:  Negative for cough and chest tightness.   Gastrointestinal:  Negative for abdominal pain and nausea.  Genitourinary:  Negative for difficulty urinating, frequency and vaginal pain.  Musculoskeletal:  Negative for arthralgias, back pain and gait problem.  Skin:  Negative for pallor and rash.  Neurological:  Negative for dizziness, tremors, weakness, numbness and headaches.  Psychiatric/Behavioral:  Negative for confusion, sleep disturbance and suicidal ideas.     Objective:  BP 130/84   Pulse 98   Temp 98.2 F (36.8 C) (Oral)   Ht 5' 6 (1.676 m)   Wt 234 lb (106.1 kg)   SpO2 98%   BMI 37.77 kg/m   BP Readings from Last 3 Encounters:  11/03/24 130/84  05/19/24 120/80  04/16/24  (!) 159/128    Wt Readings from Last 3 Encounters:  11/03/24 234 lb (106.1 kg)  05/19/24 249 lb (112.9 kg)  04/16/24 246 lb (111.6 kg)    Physical Exam Constitutional:      General: She is not in acute distress.    Appearance: She is well-developed. She is obese.  HENT:     Head: Normocephalic.     Right Ear: External ear normal.     Left Ear: External ear normal.     Nose: Nose normal.  Eyes:     General:        Right eye: No discharge.        Left eye: No discharge.     Conjunctiva/sclera: Conjunctivae normal.     Pupils: Pupils are equal, round, and reactive to light.  Neck:     Thyroid : No thyromegaly.     Vascular: No JVD.     Trachea: No tracheal deviation.  Cardiovascular:     Rate and Rhythm: Normal rate and regular rhythm.     Heart sounds: Normal heart sounds.  Pulmonary:     Effort: No respiratory distress.     Breath sounds: No stridor. No wheezing.  Abdominal:     General: Bowel sounds are normal. There is no distension.     Palpations: Abdomen is soft. There is no mass.     Tenderness: There is no abdominal tenderness. There  is no guarding or rebound.  Musculoskeletal:        General: No tenderness.     Cervical back: Normal range of motion and neck supple. No rigidity.     Right lower leg: No edema.     Left lower leg: No edema.  Lymphadenopathy:     Cervical: No cervical adenopathy.  Skin:    Findings: No erythema or rash.  Neurological:     Mental Status: She is oriented to person, place, and time.     Cranial Nerves: No cranial nerve deficit.     Motor: No abnormal muscle tone.     Coordination: Coordination normal.     Gait: Gait normal.     Deep Tendon Reflexes: Reflexes normal.  Psychiatric:        Behavior: Behavior normal.        Thought Content: Thought content normal.        Judgment: Judgment normal.     Lab Results  Component Value Date   WBC 8.8 11/25/2023   HGB 10.1 (L) 11/25/2023   HCT 32.1 (L) 11/25/2023   PLT 341  11/25/2023   GLUCOSE 93 04/16/2024   ALT 11 04/16/2024   AST 14 04/16/2024   NA 137 04/16/2024   K 4.8 04/16/2024   CL 104 04/16/2024   CREATININE 0.93 04/16/2024   BUN 10 04/16/2024   CO2 26 04/16/2024   TSH 1.18 11/27/2023   HGBA1C 5.7 11/19/2022    MM 3D SCREENING MAMMOGRAM BILATERAL BREAST Result Date: 02/05/2024 CLINICAL DATA:  Screening. EXAM: DIGITAL SCREENING BILATERAL MAMMOGRAM WITH TOMOSYNTHESIS AND CAD TECHNIQUE: Bilateral screening digital craniocaudal and mediolateral oblique mammograms were obtained. Bilateral screening digital breast tomosynthesis was performed. The images were evaluated with computer-aided detection. COMPARISON:  None.  Baseline study. ACR Breast Density Category b: There are scattered areas of fibroglandular density. FINDINGS: There are no findings suspicious for malignancy. IMPRESSION: No mammographic evidence of malignancy. A result letter of this screening mammogram will be mailed directly to the patient. RECOMMENDATION: Screening mammogram in one year. (Code:SM-B-01Y) BI-RADS CATEGORY  1: Negative. Electronically Signed   By: Alm Parkins M.D.   On: 02/05/2024 09:56    Assessment & Plan:   Problem List Items Addressed This Visit     Obesity   On Zepbound  compound inj via [facility] clinic      Relevant Medications   amphetamine -dextroamphetamine  (ADDERALL XR) 20 MG 24 hr capsule   Vitamin D  deficiency - Primary   On Vit D      IIH (idiopathic intracranial hypertension)   Re-start Diamox       Papilledema associated with increased intracranial pressure   Re-start Diamox       ADD (attention deficit disorder)   Adderall effect does not last long even when taking 20 mg in am... Change to Adderall XR 20 mg q am  Potential benefits of a long term amphetamines  use as well as potential risks  and complications were explained to the patient and were aknowledged.          Meds ordered this encounter  Medications    amphetamine -dextroamphetamine  (ADDERALL XR) 20 MG 24 hr capsule    Sig: Take 1 capsule (20 mg total) by mouth every morning.    Dispense:  30 capsule    Refill:  0      Follow-up: Return in about 3 months (around 02/01/2025) for a follow-up visit.  Marolyn Noel, MD "

## 2024-11-03 NOTE — Assessment & Plan Note (Signed)
 On Zepbound  compound inj via [facility] loss clinic
